# Patient Record
Sex: Female | Born: 1950 | Race: White | Hispanic: No | Marital: Married | Smoking: Never smoker
Health system: Southern US, Community
[De-identification: ages and names within clinical notes are randomized; demographics above are authoritative.]

## PROBLEM LIST (undated history)

## (undated) DIAGNOSIS — E119 Type 2 diabetes mellitus without complications: Secondary | ICD-10-CM

## (undated) DIAGNOSIS — E7800 Pure hypercholesterolemia, unspecified: Secondary | ICD-10-CM

## (undated) DIAGNOSIS — I10 Essential (primary) hypertension: Secondary | ICD-10-CM

## (undated) HISTORY — DX: Type 2 diabetes mellitus without complications: E11.9

## (undated) HISTORY — DX: Pure hypercholesterolemia, unspecified: E78.00

## (undated) HISTORY — DX: Essential (primary) hypertension: I10

---
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
## 1980-04-24 HISTORY — PX: TUBAL LIGATION: SHX77

## 2004-10-04 ENCOUNTER — Ambulatory Visit: Payer: Self-pay | Admitting: Internal Medicine

## 2006-01-04 ENCOUNTER — Ambulatory Visit: Payer: Self-pay | Admitting: Internal Medicine

## 2006-12-31 ENCOUNTER — Ambulatory Visit: Payer: Self-pay | Admitting: Internal Medicine

## 2007-05-02 ENCOUNTER — Ambulatory Visit: Payer: Self-pay | Admitting: Internal Medicine

## 2007-10-22 ENCOUNTER — Ambulatory Visit: Payer: Self-pay | Admitting: Family Medicine

## 2008-05-04 ENCOUNTER — Ambulatory Visit: Payer: Self-pay | Admitting: Internal Medicine

## 2010-02-15 ENCOUNTER — Ambulatory Visit: Payer: Self-pay | Admitting: Internal Medicine

## 2012-02-12 ENCOUNTER — Telehealth: Payer: Self-pay | Admitting: Internal Medicine

## 2012-02-12 NOTE — Telephone Encounter (Signed)
Pt called to get appointment with you made appointment for 1/29.  Pt stated she has not taken her meds since you let [facility] bp med Chol, Diabetic meds  (pill) Pt stated she is feeling fine.

## 2012-02-13 NOTE — Telephone Encounter (Signed)
I can see her on 02/15/12 at 11:30.  Have her bring a list of her meds - even though not taking.  Thanks.

## 2012-02-13 NOTE — Telephone Encounter (Signed)
PT AWARE OF APPOINTMENT 02/19/12 @ 3

## 2012-02-19 ENCOUNTER — Ambulatory Visit (INDEPENDENT_AMBULATORY_CARE_PROVIDER_SITE_OTHER): Payer: Self-pay | Admitting: Internal Medicine

## 2012-02-19 ENCOUNTER — Encounter: Payer: Self-pay | Admitting: Internal Medicine

## 2012-02-19 VITALS — BP 147/88 | HR 98 | Temp 98.1°F | Ht 64.0 in | Wt 170.2 lb

## 2012-02-19 DIAGNOSIS — E1165 Type 2 diabetes mellitus with hyperglycemia: Secondary | ICD-10-CM | POA: Insufficient documentation

## 2012-02-19 DIAGNOSIS — R5383 Other fatigue: Secondary | ICD-10-CM

## 2012-02-19 DIAGNOSIS — E7800 Pure hypercholesterolemia, unspecified: Secondary | ICD-10-CM

## 2012-02-19 DIAGNOSIS — E119 Type 2 diabetes mellitus without complications: Secondary | ICD-10-CM | POA: Insufficient documentation

## 2012-02-19 DIAGNOSIS — I10 Essential (primary) hypertension: Secondary | ICD-10-CM

## 2012-02-19 LAB — POCT CBG (FASTING - GLUCOSE)-MANUAL ENTRY: Glucose Fasting, POC: 399 mg/dL — AB (ref 70–99)

## 2012-02-19 MED ORDER — METFORMIN HCL 500 MG PO TABS: 500.0000 mg | ORAL_TABLET | Freq: Two times a day (BID) | ORAL | Status: DC

## 2012-02-19 MED ORDER — LISINOPRIL 20 MG PO TABS: 20.0000 mg | ORAL_TABLET | Freq: Every day | ORAL | Status: DC

## 2012-02-19 NOTE — Progress Notes (Signed)
  Subjective:    Patient ID: Gabrielle Yu, female    DOB: Aug 26, 1950, 61 y.o.   MRN: [id number]  HPI 61 year old female with past history of hypertension, hypercholesterolemia and diabetes who comes in today for a scheduled follow up.  States she has been off her medication now for a while.  Feels good.  Not exercising and not watching what she eats.  She states am sugars averaging 100-120.  Not checking her sugars regularly.    Past Medical History  Diagnosis Date  . Diabetes mellitus without complication   . Hypertension   . Hyperlipidemia   . Chicken pox     Review of Systems Patient denies any headache, lightheadedness or dizziness.  No chest pain, tightness or palpitations.  No increased shortness of breath, cough or congestion.  No acid reflux, dysphagia or odynophagia. No nausea or vomiting.  No abdominal pain or cramping.  No bowel change, such as diarrhea, constipation, BRBPR or melana.  No urine change.        Objective:   Physical Exam Filed Vitals:   02/19/12 1527  BP: 147/88  Pulse: 98  Temp: 98.1 F (32.22 C)   61 year old female in no acute distress.   HEENT:  Nares - clear.  OP- without lesions or erythema.  NECK:  Supple, nontender.  No audible carotid bruit.   HEART:  Appears to be regular. LUNGS:  Without crackles or wheezing audible.  Respirations even and unlabored.   RADIAL PULSE:  Equal bilaterally.  ABDOMEN:  Soft, nontender.  No audible abdominal bruit.   EXTREMITIES:  No increased edema to be present.                    Assessment & Plan:  FATIGUE.  Check cbc, met c and tsh.    HEALTH MAINTENANCE.  Will need to schedule a physical.  Discuss further health maintenance issus with her at next visit.  She declined a flu shot.

## 2012-02-19 NOTE — Assessment & Plan Note (Signed)
She has not been checking sugars regularly.  Blood sugar today - 399.  States she just drank a large tea.  Wanted to give her some insulin here in the office.  She declined.  Check met b and da1c.  Also check urinalysis and urine microalbumin/cr ratio.  Restart Metformin 1000mg  bid.  Follow sugars and get her back in soon to reassess.

## 2012-02-19 NOTE — Assessment & Plan Note (Addendum)
Blood pressure elevated.  On no medication now.  Check metabolic panel.  Start Lisinopril 20mg  q day.  Check blood pressures and record.  Get her back in soon to reassess.

## 2012-02-19 NOTE — Patient Instructions (Signed)
It was nice seeing you today.  I am glad you have been feeling well.  I am going to restart the Lisinopril and the Metformin.  Follow blood pressures and sugars.  We will get you scheduled for labs and a follow up appt with me soon - to follow up on the blood pressure and the sugars.

## 2012-02-19 NOTE — Assessment & Plan Note (Signed)
Was previously on Pravastatin 40mg  q day.  Has been off this medication for a while.  Low cholesterol diet and exercise.  Check lipid panel.

## 2012-02-21 ENCOUNTER — Other Ambulatory Visit: Payer: Self-pay

## 2012-02-27 ENCOUNTER — Other Ambulatory Visit (INDEPENDENT_AMBULATORY_CARE_PROVIDER_SITE_OTHER): Payer: BC Managed Care – PPO

## 2012-02-27 DIAGNOSIS — R5381 Other malaise: Secondary | ICD-10-CM

## 2012-02-27 DIAGNOSIS — E119 Type 2 diabetes mellitus without complications: Secondary | ICD-10-CM

## 2012-02-27 DIAGNOSIS — R5383 Other fatigue: Secondary | ICD-10-CM

## 2012-02-27 DIAGNOSIS — E7800 Pure hypercholesterolemia, unspecified: Secondary | ICD-10-CM

## 2012-02-27 LAB — URINALYSIS
Bilirubin Urine: NEGATIVE
Hgb urine dipstick: NEGATIVE
Ketones, ur: NEGATIVE
Nitrite: NEGATIVE
Total Protein, Urine: NEGATIVE
Urine Glucose: NEGATIVE
pH: 5.5 (ref 5.0–8.0)

## 2012-02-27 LAB — COMPREHENSIVE METABOLIC PANEL
ALT: 47 U/L — ABNORMAL HIGH (ref 0–35)
AST: 32 U/L (ref 0–37)
Albumin: 4 g/dL (ref 3.5–5.2)
CO2: 28 mEq/L (ref 19–32)
Calcium: 8.7 mg/dL (ref 8.4–10.5)
Chloride: 104 mEq/L (ref 96–112)
Creatinine, Ser: 0.7 mg/dL (ref 0.4–1.2)
GFR: 87.32 mL/min (ref >60.00)
Potassium: 4.1 mEq/L (ref 3.5–5.1)
Sodium: 139 mEq/L (ref 135–145)
Total Protein: 6.9 g/dL (ref 6.0–8.3)

## 2012-02-27 LAB — CBC WITH DIFFERENTIAL/PLATELET
Basophils Absolute: 0.1 10*3/uL (ref 0.0–0.1)
Basophils Relative: 0.8 % (ref 0.0–3.0)
Eosinophils Absolute: 0 10*3/uL (ref 0.0–0.7)
Lymphocytes Relative: 28.6 % (ref 12.0–46.0)
MCHC: 33.1 g/dL (ref 30.0–36.0)
Monocytes Relative: 6.7 % (ref 3.0–12.0)
Neutrophils Relative %: 63.9 % (ref 43.0–77.0)
RBC: 4.66 Mil/uL (ref 3.87–5.11)

## 2012-02-27 LAB — LIPID PANEL
Cholesterol: 249 mg/dL — ABNORMAL HIGH (ref 0–200)
Total CHOL/HDL Ratio: 8
VLDL: 33.2 mg/dL (ref 0.0–40.0)

## 2012-02-27 LAB — LDL CHOLESTEROL, DIRECT: Direct LDL: 198.1 mg/dL

## 2012-02-27 LAB — MICROALBUMIN / CREATININE URINE RATIO: Microalb Creat Ratio: 2.2 mg/g (ref 0.0–30.0)

## 2012-02-28 ENCOUNTER — Other Ambulatory Visit: Payer: Self-pay | Admitting: Internal Medicine

## 2012-02-28 MED ORDER — PRAVASTATIN SODIUM 20 MG PO TABS: 20.0000 mg | ORAL_TABLET | Freq: Every day | ORAL | Status: DC

## 2012-03-06 ENCOUNTER — Ambulatory Visit (INDEPENDENT_AMBULATORY_CARE_PROVIDER_SITE_OTHER): Payer: BC Managed Care – PPO | Admitting: Internal Medicine

## 2012-03-06 ENCOUNTER — Encounter: Payer: Self-pay | Admitting: Internal Medicine

## 2012-03-06 VITALS — BP 180/90 | HR 84 | Temp 98.4°F | Ht 64.0 in | Wt 169.0 lb

## 2012-03-06 DIAGNOSIS — E119 Type 2 diabetes mellitus without complications: Secondary | ICD-10-CM

## 2012-03-06 DIAGNOSIS — I10 Essential (primary) hypertension: Secondary | ICD-10-CM

## 2012-03-06 DIAGNOSIS — E7800 Pure hypercholesterolemia, unspecified: Secondary | ICD-10-CM

## 2012-03-06 LAB — BASIC METABOLIC PANEL
CO2: 27 mEq/L (ref 19–32)
Chloride: 106 mEq/L (ref 96–112)
Creatinine, Ser: 0.7 mg/dL (ref 0.4–1.2)
Sodium: 141 mEq/L (ref 135–145)

## 2012-03-06 MED ORDER — LISINOPRIL 40 MG PO TABS: 40.0000 mg | ORAL_TABLET | Freq: Every day | ORAL | Status: DC

## 2012-03-06 MED ORDER — PRAVASTATIN SODIUM 20 MG PO TABS: 20.0000 mg | ORAL_TABLET | Freq: Every day | ORAL | Status: DC

## 2012-03-06 NOTE — Assessment & Plan Note (Signed)
Blood pressure elevated.  On Lisinopril.  Will increase to 40mg  q day.  Check met b.  Follow pressures.  Get her back in soon to reassess.

## 2012-03-06 NOTE — Progress Notes (Signed)
  Subjective:    Patient ID: Gabrielle Yu, female    DOB: 12-01-1950, 61 y.o.   MRN: [id number]  HPI 61 year old female with past history of hypertension, hypercholesterolemia and diabetes.  She comes in today for a scheduled follow up.  Here today to follow up regarding her blood pressure and her sugars.  Restarted metformin last visit.  A1c returned 12.  Metformin increased to 1000mg  bid.  She has adjusted her diet.  Not drinking soft drinks now.  Brought in a few readings - am sugars 134, 118, 136.  Not checking her blood pressures.  Some minimal bowel change with the increased metformin, but overall is tolerating.  Feels better.    Past Medical History  Diagnosis Date  . Diabetes mellitus   . Hypertension   . Hypercholesterolemia   . Chicken pox     Review of Systems Patient denies any headache, lightheadedness or dizziness.  No chest pain, tightness or palpitations.  No increased shortness of breath, cough or congestion.  No nausea or vomiting.  No abdominal pain or cramping.  Some bowel change - minimal loose stool.  No constipation, BRBPR or melana.  No urine change.        Objective:   Physical Exam Filed Vitals:   03/06/12 0853  BP: 180/90  Pulse: 84  Temp: 98.4 F (36.9 C)      Assessment & Plan:  HEALTH MAINTENANCE.  Pt overdue for a physical.  Offered to do her physical today.  She declined.  Wants to wait until the January appt.  Will need to schedule her mammogram.

## 2012-03-06 NOTE — Assessment & Plan Note (Signed)
Sugars as outlined.  Continue metformin 1000 bid.  Follow sugars.  Get her back in soon to reassess.

## 2012-03-06 NOTE — Patient Instructions (Addendum)
I am going to change your blood pressure medicine (lisinopril) to 40mg  - per day.  Follow your blood pressures and let me know if problems.  Continue to monitor your blood sugars.

## 2012-03-06 NOTE — Assessment & Plan Note (Signed)
Cholesterol elevated.  Restarted the pravastatin.  Low cholesterol diet and exercise.  Follow lipid profile and liver function.

## 2012-04-01 ENCOUNTER — Telehealth: Payer: Self-pay | Admitting: Internal Medicine

## 2012-04-01 ENCOUNTER — Ambulatory Visit (INDEPENDENT_AMBULATORY_CARE_PROVIDER_SITE_OTHER): Payer: BC Managed Care – PPO | Admitting: Internal Medicine

## 2012-04-01 VITALS — BP 151/85 | HR 98 | Temp 98.4°F | Ht 64.0 in | Wt 163.5 lb

## 2012-04-01 DIAGNOSIS — E119 Type 2 diabetes mellitus without complications: Secondary | ICD-10-CM

## 2012-04-01 DIAGNOSIS — I10 Essential (primary) hypertension: Secondary | ICD-10-CM

## 2012-04-01 MED ORDER — CEFDINIR 300 MG PO CAPS: 300.0000 mg | ORAL_CAPSULE | Freq: Two times a day (BID) | ORAL | Status: DC

## 2012-04-01 MED ORDER — FLUTICASONE PROPIONATE 50 MCG/ACT NA SUSP: 2.0000 | Freq: Every day | NASAL | Status: DC

## 2012-04-01 MED ORDER — SULFACETAMIDE SODIUM 10 % OP SOLN: 1.0000 [drp] | Freq: Four times a day (QID) | OPHTHALMIC | Status: DC

## 2012-04-01 NOTE — Telephone Encounter (Signed)
Pt was evaluated in the office today.  ?

## 2012-04-01 NOTE — Patient Instructions (Addendum)
It was nice seeing you today.  I am sorry you have not been feeling well.  I am going to prescribe omnicef (an antibiotic) to take one tablet twice a day.  Also flonase nasal spray - two sprays each nostril in the evening and flush your nose with saline 2-3 x per day.  Robitussin as directed.  Let me know if problems.

## 2012-04-01 NOTE — Telephone Encounter (Signed)
Patient Information:  Caller Name: Gabrielle Yu  Phone: [phone]  Patient: Gabrielle Yu ,  Gender: Female  DOB: 1950/07/12  Age: 61 Years  PCP: Gabrielle Yu   Symptoms  Reason For Call & Symptoms: Called for antibiotics for "sinus infection" for R sided facial tenderness under eyes, cheekbones, cold symptoms for 1-2 weeks and occasional cough.  Reviewed Health History In EMR: Yes  Reviewed Medications In EMR: Yes  Reviewed Allergies In EMR: Yes  Reviewed Surgeries / Procedures: Yes  Date of Onset of Symptoms: 03/29/2012  Treatments Tried: otc cough remedy  Treatments Tried Worked: No  Guideline(s) Used:  Sinus Pain and Congestion  Disposition Per Guideline:   See Today or Tomorrow in Office  Reason For Disposition Reached:   Patient wants to be seen  Advice Given:  Hydration:  Drink plenty of liquids (6-8 glasses of water daily). If the air in your home is dry, use a cool mist humidifier  Reassurance:   Sinus congestion is a normal part of a cold.  Usually home treatment with nasal washes can prevent an actual bacterial sinus infection.  Antibiotics are not helpful for the sinus congestion that occurs with colds.  Here is some care advice that should help.  For a Runny Nose With Profuse Discharge:  Nasal mucus and discharge helps to wash viruses and bacteria out of the nose and sinuses.  If the skin around your nostrils gets irritated, apply a tiny amount of petroleum ointment to the nasal openings once or twice a day.  For a Stuffy Nose - Use Nasal Washes:  Introduction: Saline (salt water) nasal irrigation (nasal wash) is an effective and simple home remedy for treating stuffy nose and sinus congestion. The nose can be irrigated by pouring, spraying, or squirting salt water into the nose and then letting it run back out.  How it Helps: The salt water rinses out excess mucus, washes out any irritants (dust, allergens) that might be present, and moistens the nasal  cavity.  Hydration:  Drink plenty of liquids (6-8 glasses of water daily). If the air in your home is dry, use a cool mist humidifier  Expected Course:  Sinus congestion from viral upper respiratory infections (colds) usually lasts 5-10 days.  Occasionally a cold can worsen and turn into bacterial sinusitis. Clues to this are sinus symptoms lasting longer than 10 days, fever lasting longer than 3 days, and worsening pain. Bacterial sinusitis may need antibiotic treatment.  Call Back If:   Severe pain lasts longer than 2 hours after pain medicine  Sinus pain lasts longer than 1 day after starting treatment using nasal washes  Fever lasts longer than 3 days  You become worse.  Office Follow Up:  Does the office need to follow up with this patient?: No  Instructions For The Office: N/A  Appointment Scheduled:  04/01/2012 10:00:00 Appointment Scheduled Provider:  [place]  RN Note:  Using otc cough medication for diabetic with decongestant. Appointment 04/04/12. Mild facial pain present. FBS 115. Informed must be seen for antibiotics per MD order.

## 2012-04-04 ENCOUNTER — Ambulatory Visit: Payer: BC Managed Care – PPO | Admitting: Internal Medicine

## 2012-04-06 ENCOUNTER — Encounter: Payer: Self-pay | Admitting: Internal Medicine

## 2012-04-06 NOTE — Progress Notes (Signed)
  Subjective:    Patient ID: Gabrielle Yu, female    DOB: 15-Mar-1951, 61 y.o.   MRN: [id number]  HPI 61 year old female with past history of hypertension, hypercholesterolemia and diabetes.  She comes in today as a work in with concerns regarding a possible sinus infection.  States she is having increased pressure in the left maxillary sinus region and it hurts into her teeth.  Eyes matted and crusted.  Increased cough and nasal congestion.  No sore throat.  No earache.  No nausea or vomiting.  No fever.    Past Medical History  Diagnosis Date  . Diabetes mellitus   . Hypertension   . Hypercholesterolemia   . Chicken pox     Review of Systems Patient denies any lightheadedness or dizziness.  Increased maxillary pressure.  Increased nasal congestion.  No chest pain, tightness or palpitations.  No increased shortness of breath.  Does report the increased cough.   No nausea or vomiting.  No abdominal pain or cramping.  No bowel change, such as diarrhea, constipation, BRBPR or melana.  No urine change.        Objective:   Physical Exam Filed Vitals:   04/01/12 1003  BP: 151/85  Pulse: 98  Temp: 98.4 F (36.9 C)   Blood pressure recheck:  20/23  61 year old female in no acute distress.   HEENT:  Nares - erythematous turbinates.  OP- without lesions or erythema.  Minimal tenderness to palpation over the maxillary sinus.  TMs visualized - without erythema.  NECK:  Supple, nontender.   HEART:  Appears to be regular. LUNGS:  Without crackles or wheezing audible.  Respirations even and unlabored.      Assessment & Plan:  PROBABLE SINUSITIS/URI.  Treat with Omnicef 300mg  2/day as directed.  Flonase nasal spray and saline nasal flushes as directed.  Rest.  Fluids.  Notify me or be reevaluated if symptoms change, worsen or do not resolve.

## 2012-04-07 NOTE — Assessment & Plan Note (Signed)
Sugars are doing better - per her report.  Follow sugars.  Same medication.

## 2012-04-07 NOTE — Assessment & Plan Note (Signed)
Blood pressure elevated today.  Have her continue to monitor.  Follow.

## 2012-04-24 HISTORY — PX: BREAST BIOPSY: SHX20

## 2012-04-26 ENCOUNTER — Telehealth: Payer: Self-pay | Admitting: Internal Medicine

## 2012-04-26 NOTE — Telephone Encounter (Signed)
Dr. Lorin Picket,  I am routing to you

## 2012-04-26 NOTE — Telephone Encounter (Signed)
Taking metformin . Keeping her stomach upset. Is there another drug she can take that will do the same thing and cost around the same price.

## 2012-04-27 NOTE — Telephone Encounter (Signed)
Spoke to pt.  She noticed the increased diarrhea when we went up on the dose of the metformin.  Sugars are better now and she is doing better watching her diet.  Will try metformin 500mg  bid and diet adjustment and she will record sugars.  If persistent problems with diarrhea, she will call and we will change meds.  If persistent elevation in sugars, will add a different medication.

## 2012-04-30 ENCOUNTER — Telehealth: Payer: Self-pay | Admitting: Internal Medicine

## 2012-04-30 MED ORDER — METFORMIN HCL 500 MG PO TABS: 500.0000 mg | ORAL_TABLET | Freq: Two times a day (BID) | ORAL | Status: DC

## 2012-04-30 NOTE — Telephone Encounter (Signed)
Patient advised as instructed via telephone, new Rx called in for Metformin 500mg  twice daily to [facility]

## 2012-04-30 NOTE — Telephone Encounter (Signed)
Take the 500mg  bid.  After discussion with her with the increased diarrhea - needs to take 500mg  bid.

## 2012-04-30 NOTE — Telephone Encounter (Signed)
Metformin was sent into the pharmacy for 1000 mg tablet . Should she take 1000 mg or should she go back to 500 mg twice a day.

## 2012-05-01 NOTE — Telephone Encounter (Signed)
Pt pharmacy contacted. Rx was verified by myself for the 500mg  BID # 180. To accomodate a 3 month supply.

## 2012-05-01 NOTE — Telephone Encounter (Deleted)
This information was verified by Shanda Bumps with the pharmacy at [facility] [address]

## 2012-05-01 NOTE — Telephone Encounter (Signed)
Patient called stating "my prescription isn't right." She said the pharmacy wouldn't fill her medication because the quantity was incorrect

## 2012-05-22 ENCOUNTER — Encounter: Payer: Self-pay | Admitting: Internal Medicine

## 2012-05-30 ENCOUNTER — Ambulatory Visit (INDEPENDENT_AMBULATORY_CARE_PROVIDER_SITE_OTHER): Payer: BC Managed Care – PPO | Admitting: Internal Medicine

## 2012-05-30 ENCOUNTER — Encounter: Payer: Self-pay | Admitting: Internal Medicine

## 2012-05-30 VITALS — BP 130/88 | HR 83 | Temp 98.8°F | Ht 64.0 in | Wt 161.5 lb

## 2012-05-30 DIAGNOSIS — R945 Abnormal results of liver function studies: Secondary | ICD-10-CM

## 2012-05-30 DIAGNOSIS — Z139 Encounter for screening, unspecified: Secondary | ICD-10-CM

## 2012-05-30 DIAGNOSIS — K769 Liver disease, unspecified: Secondary | ICD-10-CM

## 2012-05-30 DIAGNOSIS — I10 Essential (primary) hypertension: Secondary | ICD-10-CM

## 2012-05-30 DIAGNOSIS — E119 Type 2 diabetes mellitus without complications: Secondary | ICD-10-CM

## 2012-05-30 DIAGNOSIS — E7800 Pure hypercholesterolemia, unspecified: Secondary | ICD-10-CM

## 2012-05-30 LAB — BASIC METABOLIC PANEL
Calcium: 9.1 mg/dL (ref 8.4–10.5)
Creatinine, Ser: 0.8 mg/dL (ref 0.4–1.2)
GFR: 76.16 mL/min (ref >60.00)
Sodium: 140 mEq/L (ref 135–145)

## 2012-05-30 LAB — LIPID PANEL
Cholesterol: 193 mg/dL (ref 0–200)
Triglycerides: 148 mg/dL (ref 0.0–149.0)

## 2012-05-30 LAB — HEPATIC FUNCTION PANEL: Albumin: 4.2 g/dL (ref 3.5–5.2)

## 2012-05-30 MED ORDER — HYDROCHLOROTHIAZIDE 12.5 MG PO CAPS: 12.5000 mg | ORAL_CAPSULE | Freq: Every day | ORAL | Status: DC

## 2012-05-31 ENCOUNTER — Other Ambulatory Visit: Payer: Self-pay | Admitting: Internal Medicine

## 2012-05-31 ENCOUNTER — Encounter: Payer: Self-pay | Admitting: Internal Medicine

## 2012-05-31 DIAGNOSIS — R945 Abnormal results of liver function studies: Secondary | ICD-10-CM | POA: Insufficient documentation

## 2012-05-31 NOTE — Assessment & Plan Note (Signed)
Sugars per her report, much better.  Discussed need for eye exam.  Continue diet adjustment and exercise.  Check metabolic panel and a1c.  Same medication regimen.

## 2012-05-31 NOTE — Progress Notes (Signed)
  Subjective:    Patient ID: Gabrielle Yu, female    DOB: 1950/09/03, 62 y.o.   MRN: [id number]  HPI 62 year old female with past history of hypertension, hypercholesterolemia and diabetes.  She comes in today to follow up on these issues as well as for a complete physical exam.  Is now taking her lisinopril and pravastatin.  She is also taking metformin 500mg  bid.  Brought in no recorded sugar readings.  States she has been checking and she has no sugar readings recently over 130 - (am or pm). Previous congestion has resolved.  No sob.  Trying to eat better.  No nausea or vomiting.  No bowel change.      Past Medical History  Diagnosis Date  . Diabetes mellitus   . Hypertension   . Hypercholesterolemia     Current Outpatient Prescriptions on File Prior to Visit  Medication Sig Dispense Refill  . hydrochlorothiazide (MICROZIDE) 12.5 MG capsule Take 1 capsule (12.5 mg total) by mouth daily.  30 capsule  3  . lisinopril (PRINIVIL,ZESTRIL) 40 MG tablet Take 1 tablet (40 mg total) by mouth daily.  90 tablet  1  . metFORMIN (GLUCOPHAGE) 500 MG tablet Take 1 tablet (500 mg total) by mouth 2 (two) times daily.  90 tablet  3  . pravastatin (PRAVACHOL) 20 MG tablet Take 1 tablet (20 mg total) by mouth daily.  90 tablet  1    Review of Systems Patient denies any lightheadedness or dizziness.  No sinus or allergy problems.  No chest pain, tightness or palpitations.  No increased shortness of breath.  No cough or chest congestion.  No nausea or vomiting.  No abdominal pain or cramping.  No bowel change, such as diarrhea, constipation, BRBPR or melana.  No urine change.  She does report a persistent left leg lesion.  Some scaling.  Has been applying neosporin daily for weeks.       Objective:   Physical Exam  Filed Vitals:   05/30/12 0820  BP: 130/88  Pulse: 83  Temp: 98.8 F (37.1 C)   Blood pressure recheck:  56-12/36  63 year old female in no acute distress.   HEENT:  Nares- clear.   Oropharynx - without lesions. NECK:  Supple.  Nontender.  No audible bruit.  HEART:  Appears to be regular. LUNGS:  No crackles or wheezing audible.  Respirations even and unlabored.  RADIAL PULSE:  Equal bilaterally.    ABDOMEN:  Soft, nontender.  Bowel sounds present and normal.  No audible abdominal bruit.    EXTREMITIES:  No increased edema present.  DP pulses palpable and equal bilaterally.   SKIN:  Lesion left lower let - scaling.  Nontender.   No evidence of infection.          Assessment & Plan:  SKIN LESION.  Persistent.  May be being aggravated by the neosporin.  No evidence of infection.  Stop neosporin.  Follow.  Notify me if persistent.    CARDIOVASCULAR.  Has a heart murmur.  ECHO 2001 revealed trivial MR and TR with EF 55-60%.  Currently asymptomatic.    HEALTH MAINTENANCE.  Physical today.  Schedule mammogram.  Discussed need for colonoscopy.  She declines.  Did agree to IFOB.

## 2012-05-31 NOTE — Assessment & Plan Note (Signed)
Blood pressure still elevated.  Add HCTZ 12.5mg  (back to her regimen).  She will monitor pressures and record.  Will send in over the next few weeks.

## 2012-05-31 NOTE — Progress Notes (Signed)
Order placed for follow up lab.  

## 2012-05-31 NOTE — Assessment & Plan Note (Signed)
On pravastatin.  Low cholesterol diet and exercise.  Check lipid panel and liver function.    

## 2012-05-31 NOTE — Assessment & Plan Note (Signed)
Has had a history of elevated liver enzymes.  Has declined abdominal ultrasound.  Recheck liver panel.  Diet and weight loss.

## 2012-07-04 ENCOUNTER — Other Ambulatory Visit: Payer: BC Managed Care – PPO

## 2012-07-11 ENCOUNTER — Other Ambulatory Visit (INDEPENDENT_AMBULATORY_CARE_PROVIDER_SITE_OTHER): Payer: BC Managed Care – PPO

## 2012-07-11 DIAGNOSIS — R945 Abnormal results of liver function studies: Secondary | ICD-10-CM

## 2012-07-11 DIAGNOSIS — K769 Liver disease, unspecified: Secondary | ICD-10-CM

## 2012-07-11 LAB — HEPATIC FUNCTION PANEL
AST: 26 U/L (ref 0–37)
Alkaline Phosphatase: 64 U/L (ref 39–117)
Total Bilirubin: 0.7 mg/dL (ref 0.3–1.2)

## 2012-07-12 ENCOUNTER — Other Ambulatory Visit: Payer: Self-pay | Admitting: Internal Medicine

## 2012-07-12 DIAGNOSIS — R945 Abnormal results of liver function studies: Secondary | ICD-10-CM

## 2012-07-12 DIAGNOSIS — E119 Type 2 diabetes mellitus without complications: Secondary | ICD-10-CM

## 2012-07-12 DIAGNOSIS — I10 Essential (primary) hypertension: Secondary | ICD-10-CM

## 2012-07-12 DIAGNOSIS — E7800 Pure hypercholesterolemia, unspecified: Secondary | ICD-10-CM

## 2012-07-12 NOTE — Progress Notes (Signed)
Follow up labs ordered.

## 2012-07-19 ENCOUNTER — Ambulatory Visit: Payer: Self-pay | Admitting: Internal Medicine

## 2012-08-01 ENCOUNTER — Ambulatory Visit: Payer: Self-pay | Admitting: Internal Medicine

## 2012-08-04 ENCOUNTER — Telehealth: Payer: Self-pay | Admitting: Internal Medicine

## 2012-08-04 DIAGNOSIS — R928 Other abnormal and inconclusive findings on diagnostic imaging of breast: Secondary | ICD-10-CM

## 2012-08-04 MED ORDER — ALPRAZOLAM 0.25 MG PO TABS: ORAL_TABLET | ORAL | Status: DC

## 2012-08-04 NOTE — Telephone Encounter (Signed)
Pt notified of abnormal mammo results and need for referral to surgery.  Order for referral to Dr Lemar Livings placed.  She is having increased anxiety.  Wants something to help her calm down. Called in xanax .25mg  bid prn #20 with no refills.

## 2012-08-06 ENCOUNTER — Ambulatory Visit (INDEPENDENT_AMBULATORY_CARE_PROVIDER_SITE_OTHER): Payer: BC Managed Care – PPO | Admitting: General Surgery

## 2012-08-06 ENCOUNTER — Encounter: Payer: Self-pay | Admitting: General Surgery

## 2012-08-06 VITALS — BP 152/84 | HR 92 | Resp 16 | Ht 64.0 in | Wt 163.0 lb

## 2012-08-06 DIAGNOSIS — R928 Other abnormal and inconclusive findings on diagnostic imaging of breast: Secondary | ICD-10-CM | POA: Insufficient documentation

## 2012-08-06 DIAGNOSIS — N63 Unspecified lump in unspecified breast: Secondary | ICD-10-CM

## 2012-08-06 NOTE — Patient Instructions (Addendum)

## 2012-08-06 NOTE — Procedures (Signed)
Ultrasound examination of the left breast in the 4:00 position 2 cm from the nipple the area of the palpable thickening showed a 0.7 x 0.85 x 1.34 cm hypoechoic area with focal acoustic shadowing. The patient was amenable to vacuum-assisted biopsy. Using 10 cc of 0.5% Xylocaine with 0.25% Marcaine with one 200,000 of epinephrine, the area was prepped with ChloraPrep and draped. A 10-gauge Encor device was passed under ultrasound guidance. The area was removed in its entirety. A postbiopsy clip was placed. The skin defect was closed with benzoin and Steri-Strips followed by Telfa Tegaderm dressing. The patient tolerated the procedure well.

## 2012-08-06 NOTE — Progress Notes (Signed)
Patient ID: Gabrielle Yu, female   DOB: May 29, 1950, 62 y.o.   MRN: [id number]  Chief Complaint  Patient presents with  . Other    mammo    HPI Gabrielle Yu is a 62 y.o. female here today for an abnormal mammogram. Most recent mammogram was done 07/19/12 at [facility] with birad category 0. Added views were done of the right breast on 08/01/12 with birad category 4. Left breast ultrasound was done on 08/01/12 as well. She denies any current or previous breast problems or surgeries. Her mother had a history of breast cancer in which she was diagnosed at age 69. She checks her breast regularly and was getting regular mammograms up until 2-3 years ago. She states she hasn't had a mammogram recently until now because she was unable find out where her primary care physician relocated.   HPI  Past Medical History  Diagnosis Date  . Diabetes mellitus   . Hypertension   . Hypercholesterolemia   . High cholesterol     Past Surgical History  Procedure Laterality Date  . Tonsillectomy and adenoidectomy  1958  . Tubal ligation  1982    Family History  Problem Relation Age of Onset  . Breast cancer Mother   . Heart disease Father     myocardial infarction - died age 68  . Heart disease      paternal aunts and uncles  . Colon cancer Neg Hx   . Hypertension Mother     Social History History  Substance Use Topics  . Smoking status: Never Smoker   . Smokeless tobacco: Never Used  . Alcohol Use: No    Allergies  Allergen Reactions  . Zoloft (Sertraline Hcl) Rash    Current Outpatient Prescriptions  Medication Sig Dispense Refill  . hydrochlorothiazide (MICROZIDE) 12.5 MG capsule Take 1 capsule (12.5 mg total) by mouth daily.  30 capsule  3  . lisinopril (PRINIVIL,ZESTRIL) 40 MG tablet Take 1 tablet (40 mg total) by mouth daily.  90 tablet  1  . metFORMIN (GLUCOPHAGE) 500 MG tablet Take 1 tablet (500 mg total) by mouth 2 (two) times daily.  90 tablet  3  . pravastatin (PRAVACHOL) 20 MG  tablet Take 1 tablet (20 mg total) by mouth daily.  90 tablet  1   No current facility-administered medications for this visit.    Review of Systems Review of Systems  Constitutional: Negative.   Respiratory: Negative.   Cardiovascular: Negative.   Psychiatric/Behavioral: Positive for agitation.    Blood pressure 152/84, pulse 92, resp. rate 16, height 5\' 4"  (1.626 m), weight 163 lb (73.936 kg).  Physical Exam Physical Exam  Constitutional: She appears well-developed and well-nourished.  Neck: Trachea normal. No mass and no thyromegaly present.  Cardiovascular: Normal rate, regular rhythm, normal heart sounds and normal pulses.   No murmur heard. Pulmonary/Chest: Effort normal and breath sounds normal. Right breast exhibits no inverted nipple, no mass, no nipple discharge, no skin change and no tenderness. Left breast exhibits no inverted nipple, no mass, no nipple discharge, no skin change and no tenderness. Breasts are symmetrical.  Left breast thickening at 4 o'clock.  Lymphadenopathy:    She has no cervical adenopathy.    She has no axillary adenopathy.    Data Reviewed Screening mammograms dated July 19, 2012 were compared to October 2011 studies. A focal asymmetry was noted in the inferior lateral aspect of left breast. Additional views were requested. BI-RAD-0.  Focal spot compression views of the left breast  in August 01, 2012 showed persistent focal asymmetry at the 4:00 position. Ultrasound examination of this area shows 0.7 x 0.7 x 1.3 cm hypoechoic mass with posterior acoustic shadowing. BIRAD-4.  Ultrasound examination of the left breast in the 4:00 position 2 cm from the nipple the area of the palpable thickening showed a 0.7 x 0.85 x 1.34 cm hypoechoic area with focal acoustic shadowing. The patient was amenable to vacuum-assisted biopsy. Using 10 cc of 0.5% Xylocaine with 0.25% Marcaine with one 200,000 of epinephrine, the area was prepped with ChloraPrep and draped.  A 10-gauge Encor device was passed under ultrasound guidance. The area was removed in its entirety. A postbiopsy clip was placed. The skin defect was closed with benzoin and Steri-Strips followed by Telfa Tegaderm dressing. The patient tolerated the procedure well.  Assessment    Abnormal left breast mammogram and focal thickening in the lower outer quadrant.     Plan    The patient will be contacted when the pathology reports are available. Written instructions have been provided for wound care.        Gabrielle Yu 08/06/2012, 10:01 PM

## 2012-08-07 ENCOUNTER — Telehealth: Payer: Self-pay | Admitting: *Deleted

## 2012-08-07 LAB — PATHOLOGY

## 2012-08-07 NOTE — Telephone Encounter (Signed)
Notified patient as instructed, patient very pleased. Discussed follow-up appointments, patient agrees 

## 2012-08-07 NOTE — Telephone Encounter (Signed)
Message copied by Currie Paris on Wed Aug 07, 2012  3:22 PM ------      Message from: [place], [place] W      Created: Wed Aug 07, 2012  2:47 PM       Please notify the patient the biopsy was benign. F/U w/ RN as scheduled. Will arrange for f/u mammogram in six months.  ------

## 2012-08-13 ENCOUNTER — Ambulatory Visit (INDEPENDENT_AMBULATORY_CARE_PROVIDER_SITE_OTHER): Payer: BC Managed Care – PPO | Admitting: *Deleted

## 2012-08-13 DIAGNOSIS — R928 Other abnormal and inconclusive findings on diagnostic imaging of breast: Secondary | ICD-10-CM

## 2012-08-13 NOTE — Patient Instructions (Addendum)
Patient here today for follow up post left breast biopsy.  Dressing removed, steristrip in place and aware it may come off in one week.  Minimal bruising noted.  The patient is aware that a heating pad may be used for comfort as needed.  Aware of pathology. Follow up as scheduled. 

## 2012-08-14 NOTE — Progress Notes (Signed)
Quick Note:  The pathology has been reviewed. The lesion identified on ultrasound was shadowing,but as it was completely removed during her core biopsy I think that the pathology is indeed correct. ______

## 2012-08-22 ENCOUNTER — Telehealth: Payer: Self-pay

## 2012-08-22 NOTE — Telephone Encounter (Signed)
Patient left voicemail about her mammogram and that she is needing a biopsy. Left message on patient voicemail for her to give the offfice a call back about any questions she was needing answered.

## 2012-08-29 ENCOUNTER — Encounter: Payer: Self-pay | Admitting: Internal Medicine

## 2012-08-30 ENCOUNTER — Encounter: Payer: Self-pay | Admitting: Internal Medicine

## 2012-09-27 ENCOUNTER — Ambulatory Visit: Payer: BC Managed Care – PPO | Admitting: Internal Medicine

## 2012-10-18 ENCOUNTER — Other Ambulatory Visit: Payer: Self-pay | Admitting: Internal Medicine

## 2012-10-18 NOTE — Telephone Encounter (Signed)
Eprescribed.

## 2012-11-06 ENCOUNTER — Other Ambulatory Visit: Payer: Self-pay | Admitting: Internal Medicine

## 2012-11-29 ENCOUNTER — Telehealth: Payer: Self-pay | Admitting: *Deleted

## 2012-11-29 ENCOUNTER — Other Ambulatory Visit: Payer: Self-pay | Admitting: *Deleted

## 2012-11-29 NOTE — Telephone Encounter (Signed)
Spoke with pt, states she has never had an eye exam, flu vaccine, or pneumonia vaccine. Advised on need for eye exam and vaccines. Also notified pt that she is due for a followup with Dr. Lorin Picket. Pt declined to schedule an appointment while on the phone. States she will call office once she can get through "the next two weeks of school."

## 2013-01-06 ENCOUNTER — Other Ambulatory Visit: Payer: Self-pay | Admitting: Internal Medicine

## 2013-01-21 ENCOUNTER — Other Ambulatory Visit (INDEPENDENT_AMBULATORY_CARE_PROVIDER_SITE_OTHER): Payer: BC Managed Care – PPO

## 2013-01-21 DIAGNOSIS — E7800 Pure hypercholesterolemia, unspecified: Secondary | ICD-10-CM

## 2013-01-21 DIAGNOSIS — E119 Type 2 diabetes mellitus without complications: Secondary | ICD-10-CM

## 2013-01-21 DIAGNOSIS — R945 Abnormal results of liver function studies: Secondary | ICD-10-CM

## 2013-01-21 DIAGNOSIS — K769 Liver disease, unspecified: Secondary | ICD-10-CM

## 2013-01-21 DIAGNOSIS — I10 Essential (primary) hypertension: Secondary | ICD-10-CM

## 2013-01-21 LAB — BASIC METABOLIC PANEL
BUN: 17 mg/dL (ref 6–23)
Chloride: 102 mEq/L (ref 96–112)
Creatinine, Ser: 0.9 mg/dL (ref 0.4–1.2)
Glucose, Bld: 130 mg/dL — ABNORMAL HIGH (ref 70–99)

## 2013-01-21 LAB — HEMOGLOBIN A1C: Hgb A1c MFr Bld: 8.2 % — ABNORMAL HIGH (ref 4.6–6.5)

## 2013-01-21 LAB — LIPID PANEL
Total CHOL/HDL Ratio: 6
VLDL: 30.4 mg/dL (ref 0.0–40.0)

## 2013-01-21 LAB — HEPATIC FUNCTION PANEL
Alkaline Phosphatase: 50 U/L (ref 39–117)
Bilirubin, Direct: 0.1 mg/dL (ref 0.0–0.3)
Total Bilirubin: 0.6 mg/dL (ref 0.3–1.2)

## 2013-01-27 ENCOUNTER — Encounter: Payer: Self-pay | Admitting: Internal Medicine

## 2013-01-27 ENCOUNTER — Ambulatory Visit (INDEPENDENT_AMBULATORY_CARE_PROVIDER_SITE_OTHER): Payer: BC Managed Care – PPO | Admitting: Internal Medicine

## 2013-01-27 VITALS — BP 112/80 | HR 87 | Temp 98.5°F | Ht 64.0 in | Wt 167.8 lb

## 2013-01-27 DIAGNOSIS — K769 Liver disease, unspecified: Secondary | ICD-10-CM

## 2013-01-27 DIAGNOSIS — E7800 Pure hypercholesterolemia, unspecified: Secondary | ICD-10-CM

## 2013-01-27 DIAGNOSIS — Z23 Encounter for immunization: Secondary | ICD-10-CM

## 2013-01-27 DIAGNOSIS — R928 Other abnormal and inconclusive findings on diagnostic imaging of breast: Secondary | ICD-10-CM

## 2013-01-27 DIAGNOSIS — E119 Type 2 diabetes mellitus without complications: Secondary | ICD-10-CM

## 2013-01-27 DIAGNOSIS — I10 Essential (primary) hypertension: Secondary | ICD-10-CM

## 2013-01-27 DIAGNOSIS — R945 Abnormal results of liver function studies: Secondary | ICD-10-CM

## 2013-01-27 MED ORDER — PRAVASTATIN SODIUM 40 MG PO TABS: 40.0000 mg | ORAL_TABLET | Freq: Every day | ORAL | Status: DC

## 2013-01-27 MED ORDER — HYDROCHLOROTHIAZIDE 12.5 MG PO CAPS: 12.5000 mg | ORAL_CAPSULE | Freq: Every day | ORAL | Status: DC

## 2013-01-29 ENCOUNTER — Encounter: Payer: Self-pay | Admitting: Internal Medicine

## 2013-01-29 NOTE — Assessment & Plan Note (Signed)
Recent A1c elevated.  Discussed at length with her today.  She has adjusted her diet and is now checking her sugars.  Sugars significantly improved.  See readings as outlined.  Will continue same medication for now.  Get her back in soon to reassess.  If persistent elevation, will require further medications.  Needs eye exam.

## 2013-01-29 NOTE — Assessment & Plan Note (Signed)
On pravastatin.  Low cholesterol diet and exercise.  Recent LDL remains elevated.  Increase pravastatin to 20mg  q day.  Follow.

## 2013-01-29 NOTE — Assessment & Plan Note (Signed)
Mammogram 07/19/12 - recommended f/u views.  F/u mammogram 08/01/12 - Birads IV.  Referred to Dr Lemar Livings.  Biopsy negative.

## 2013-01-29 NOTE — Progress Notes (Signed)
  Subjective:    Patient ID: Gabrielle Yu, female    DOB: [birth year], 62 y.o.   MRN: [id number]  HPI 62 year old female with past history of hypertension, hypercholesterolemia and diabetes.  She comes in today for a scheduled follow up.  I asked her to come in after review of her recent labs.  Her a1c is elevated - 8.2.  She is taking metformin 500mg  bid.  Brought in no recorded sugar readings.  Has not been checking her sugars.  Not watching what she eats and not exercising.  Since being notified of her labs, she has started checking her sugars.  Also has been paying more attention to her diet.  No sob.  No nausea or vomiting.  No bowel change.  Unable to increase metformin secondary her GI intolerance.  Feels better.      Past Medical History  Diagnosis Date  . Diabetes mellitus   . Hypertension   . Hypercholesterolemia   . High cholesterol     Current Outpatient Prescriptions on File Prior to Visit  Medication Sig Dispense Refill  . lisinopril (PRINIVIL,ZESTRIL) 40 MG tablet Take 1 tablet (40 mg total) by mouth daily. NEEDS APPT FOR FURTHER REFILLS  90 tablet  1  . metFORMIN (GLUCOPHAGE) 500 MG tablet Take 1 tablet (500 mg total) by mouth 2 (two) times daily.  90 tablet  3   No current facility-administered medications on file prior to visit.    Review of Systems Patient denies any lightheadedness or dizziness.  No sinus or allergy problems.  No chest pain, tightness or palpitations.  No increased shortness of breath.  No cough or chest congestion.  No nausea or vomiting.  No abdominal pain or cramping.  No bowel change, such as diarrhea, constipation, BRBPR or melana.  No urine change.   States sugars the last few mornings have decreased from 140 to 130 to 122 this am.        Objective:   Physical Exam  Filed Vitals:   01/27/13 0802  BP: 112/80  Pulse: 87  Temp: 98.5 F (56.36 C)   62 year old female in no acute distress.   HEENT:  Nares- clear.  Oropharynx - without  lesions. NECK:  Supple.  Nontender.  No audible bruit.  HEART:  Appears to be regular. LUNGS:  No crackles or wheezing audible.  Respirations even and unlabored.  RADIAL PULSE:  Equal bilaterally.    ABDOMEN:  Soft, nontender.  Bowel sounds present and normal.  No audible abdominal bruit.    EXTREMITIES:  No increased edema present.  DP pulses palpable and equal bilaterally.   FEET:  No lesions.          Assessment & Plan:  CARDIOVASCULAR.  Has a heart murmur.  ECHO 2001 revealed trivial MR and TR with EF 55-60%.  Currently asymptomatic.    HEALTH MAINTENANCE.  Physical 05/30/12..  Mammogram 3/14 - birads 4.  Referred to Dr Lemar Livings.  Biopsy negative.   Have discussed need for colonoscopy.  She has declined.

## 2013-01-29 NOTE — Assessment & Plan Note (Signed)
Blood pressure doing well.  Follow.  Follow metabolic panel.  

## 2013-01-29 NOTE — Assessment & Plan Note (Signed)
Has had a history of elevated liver enzymes.  Has declined abdominal ultrasound.  Again discussed with her today.  AST/ALT elevated.  She continues to decline further w/up.  Diet and weight loss.

## 2013-02-04 ENCOUNTER — Ambulatory Visit: Payer: BC Managed Care – PPO | Admitting: General Surgery

## 2013-02-25 ENCOUNTER — Other Ambulatory Visit (INDEPENDENT_AMBULATORY_CARE_PROVIDER_SITE_OTHER): Payer: BC Managed Care – PPO

## 2013-02-25 DIAGNOSIS — R945 Abnormal results of liver function studies: Secondary | ICD-10-CM

## 2013-02-25 DIAGNOSIS — K769 Liver disease, unspecified: Secondary | ICD-10-CM

## 2013-02-25 LAB — HEPATIC FUNCTION PANEL
ALT: 58 U/L — ABNORMAL HIGH (ref 0–35)
Alkaline Phosphatase: 48 U/L (ref 39–117)
Bilirubin, Direct: 0.1 mg/dL (ref 0.0–0.3)
Total Bilirubin: 0.4 mg/dL (ref 0.3–1.2)

## 2013-02-27 ENCOUNTER — Ambulatory Visit (INDEPENDENT_AMBULATORY_CARE_PROVIDER_SITE_OTHER): Payer: BC Managed Care – PPO | Admitting: Internal Medicine

## 2013-02-27 ENCOUNTER — Encounter: Payer: Self-pay | Admitting: Internal Medicine

## 2013-02-27 VITALS — BP 120/80 | HR 81 | Temp 97.9°F | Ht 64.0 in | Wt 167.5 lb

## 2013-02-27 DIAGNOSIS — E7800 Pure hypercholesterolemia, unspecified: Secondary | ICD-10-CM

## 2013-02-27 DIAGNOSIS — R945 Abnormal results of liver function studies: Secondary | ICD-10-CM

## 2013-02-27 DIAGNOSIS — E119 Type 2 diabetes mellitus without complications: Secondary | ICD-10-CM

## 2013-02-27 DIAGNOSIS — I10 Essential (primary) hypertension: Secondary | ICD-10-CM

## 2013-02-27 DIAGNOSIS — R928 Other abnormal and inconclusive findings on diagnostic imaging of breast: Secondary | ICD-10-CM

## 2013-02-27 DIAGNOSIS — K769 Liver disease, unspecified: Secondary | ICD-10-CM

## 2013-02-27 NOTE — Assessment & Plan Note (Addendum)
She has adjusted her diet and is now checking her sugars.  Sugars improved.  See readings as outlined.  Will continue same medication.   Needs eye exam.

## 2013-02-27 NOTE — Progress Notes (Signed)
Pre-visit discussion using our clinic review tool. No additional management support is needed unless otherwise documented below in the visit note.  

## 2013-02-27 NOTE — Assessment & Plan Note (Addendum)
Has had a history of elevated liver enzymes.  Has declined abdominal ultrasound.  Again discussed with her today.  AST/ALT elevated.  She continues to decline further w/up.  Diet and weight loss.  AST/ALT stable.

## 2013-02-27 NOTE — Assessment & Plan Note (Signed)
Mammogram 07/19/12 - recommended f/u views.  F/u mammogram 08/01/12 - Birads IV.  Referred to Dr Byrnett.  Biopsy negative.   

## 2013-02-27 NOTE — Progress Notes (Signed)
  Subjective:    Patient ID: Gabrielle Yu, female    DOB: 02-14-1951, 62 y.o.   MRN: [id number]  HPI 62 year old female with past history of hypertension, hypercholesterolemia and diabetes.  She comes in today for a scheduled follow up.   She is taking metformin 500mg  bid.  Brought in a few sugar readings.  See attached list.  overall appear to be doing well.  Trying to watch what she eats.  No chest pain or tightness.  No sob.  No nausea or vomiting.  No bowel change.  Feels good.       Past Medical History  Diagnosis Date  . Diabetes mellitus   . Hypertension   . Hypercholesterolemia   . High cholesterol     Current Outpatient Prescriptions on File Prior to Visit  Medication Sig Dispense Refill  . hydrochlorothiazide (MICROZIDE) 12.5 MG capsule Take 1 capsule (12.5 mg total) by mouth daily.  90 capsule  1  . lisinopril (PRINIVIL,ZESTRIL) 40 MG tablet Take 1 tablet (40 mg total) by mouth daily. NEEDS APPT FOR FURTHER REFILLS  90 tablet  1  . metFORMIN (GLUCOPHAGE) 500 MG tablet Take 1 tablet (500 mg total) by mouth 2 (two) times daily.  90 tablet  3  . pravastatin (PRAVACHOL) 40 MG tablet Take 1 tablet (40 mg total) by mouth daily.  90 tablet  1   No current facility-administered medications on file prior to visit.    Review of Systems Patient denies any lightheadedness or dizziness.  No sinus or allergy problems.  No chest pain, tightness or palpitations.  No increased shortness of breath.  No cough or chest congestion.  No nausea or vomiting.  No abdominal pain or cramping.  No bowel change, such as diarrhea, constipation, BRBPR or melana.  No urine change.   Sugars better.         Objective:   Physical Exam  Filed Vitals:   02/27/13 0938  BP: 120/80  Pulse: 81  Temp: 97.9 F (36.6 C)   Blood pressure recheck:  128/82, pulse 52  62 year old female in no acute distress.   HEENT:  Nares- clear.  Oropharynx - without lesions. NECK:  Supple.  Nontender.  No audible bruit.   HEART:  Appears to be regular. LUNGS:  No crackles or wheezing audible.  Respirations even and unlabored.  RADIAL PULSE:  Equal bilaterally.    ABDOMEN:  Soft, nontender.  Bowel sounds present and normal.  No audible abdominal bruit.    EXTREMITIES:  No increased edema present.  DP pulses palpable and equal bilaterally.   FEET:  No lesions.          Assessment & Plan:  CARDIOVASCULAR.  Has a heart murmur.  ECHO 2001 revealed trivial MR and TR with EF 55-60%.  Currently asymptomatic.    HEALTH MAINTENANCE.  Physical 05/30/12..  Mammogram 3/14 - birads 4.  Referred to Dr Lemar Livings.  Biopsy negative.   Have discussed need for colonoscopy.  She has declined.

## 2013-02-27 NOTE — Assessment & Plan Note (Signed)
Blood pressure doing well.  Follow.  Follow metabolic panel.  

## 2013-02-27 NOTE — Assessment & Plan Note (Addendum)
On pravastatin 40mg .  Will change to 20mg  (two tablets per day).  Tolerates the 20mg  tablets better.  Follow lipid profile and liver function.

## 2013-03-02 ENCOUNTER — Encounter: Payer: Self-pay | Admitting: Internal Medicine

## 2013-03-02 MED ORDER — PRAVASTATIN SODIUM 20 MG PO TABS: ORAL_TABLET | ORAL | Status: DC

## 2013-03-18 ENCOUNTER — Encounter: Payer: Self-pay | Admitting: *Deleted

## 2013-05-03 ENCOUNTER — Other Ambulatory Visit: Payer: Self-pay | Admitting: Internal Medicine

## 2013-06-11 ENCOUNTER — Encounter: Payer: BC Managed Care – PPO | Admitting: Internal Medicine

## 2013-06-23 ENCOUNTER — Other Ambulatory Visit: Payer: Self-pay | Admitting: Internal Medicine

## 2013-08-25 ENCOUNTER — Ambulatory Visit (INDEPENDENT_AMBULATORY_CARE_PROVIDER_SITE_OTHER): Payer: BC Managed Care – PPO | Admitting: Internal Medicine

## 2013-08-25 ENCOUNTER — Other Ambulatory Visit (HOSPITAL_COMMUNITY)
Admission: RE | Admit: 2013-08-25 | Discharge: 2013-08-25 | Disposition: A | Discharge status: Home / Self Care | Payer: BC Managed Care – PPO | Source: Ambulatory Visit | Attending: Internal Medicine | Admitting: Internal Medicine

## 2013-08-25 ENCOUNTER — Encounter: Payer: Self-pay | Admitting: Internal Medicine

## 2013-08-25 VITALS — BP 130/80 | HR 95 | Temp 98.3°F | Ht 64.5 in | Wt 170.8 lb

## 2013-08-25 DIAGNOSIS — I10 Essential (primary) hypertension: Secondary | ICD-10-CM

## 2013-08-25 DIAGNOSIS — Z1151 Encounter for screening for human papillomavirus (HPV): Secondary | ICD-10-CM | POA: Insufficient documentation

## 2013-08-25 DIAGNOSIS — E119 Type 2 diabetes mellitus without complications: Secondary | ICD-10-CM

## 2013-08-25 DIAGNOSIS — R945 Abnormal results of liver function studies: Secondary | ICD-10-CM

## 2013-08-25 DIAGNOSIS — Z01419 Encounter for gynecological examination (general) (routine) without abnormal findings: Secondary | ICD-10-CM | POA: Insufficient documentation

## 2013-08-25 DIAGNOSIS — E7800 Pure hypercholesterolemia, unspecified: Secondary | ICD-10-CM

## 2013-08-25 DIAGNOSIS — R928 Other abnormal and inconclusive findings on diagnostic imaging of breast: Secondary | ICD-10-CM

## 2013-08-25 DIAGNOSIS — K769 Liver disease, unspecified: Secondary | ICD-10-CM

## 2013-08-25 DIAGNOSIS — Z124 Encounter for screening for malignant neoplasm of cervix: Secondary | ICD-10-CM

## 2013-08-25 NOTE — Assessment & Plan Note (Signed)
Blood pressure doing well.  Follow.  Follow metabolic panel.  

## 2013-08-25 NOTE — Progress Notes (Signed)
Subjective:    Patient ID: Gabrielle Yu, female    DOB: 15-Nov-1950, 63 y.o.   MRN: [id number]  HPI 63 year old female with past history of hypertension, hypercholesterolemia and diabetes.  She comes in today to follow up on these issues as well as for a complete physical exam.    She is taking metformin 500mg  bid.  Brought in no sugar readings.  States am sugars averaging 120-140 and pm sugars averaging 160-170.  Overall appears to be doing well.  No chest pain or tightness.  No sob.  No nausea or vomiting.  No bowel change.  Feels good.  Plans to get more serious about her diet and exercise.  Scheduled to see the eye doctor (Dr Reece Leaderonnelly) this week.        Past Medical History  Diagnosis Date  . Diabetes mellitus   . Hypertension   . Hypercholesterolemia   . High cholesterol     Current Outpatient Prescriptions on File Prior to Visit  Medication Sig Dispense Refill  . hydrochlorothiazide (MICROZIDE) 12.5 MG capsule Take 1 capsule (12.5 mg total) by mouth daily.  90 capsule  1  . lisinopril (PRINIVIL,ZESTRIL) 40 MG tablet Take 1 tablet (40 mg total) by mouth daily.  90 tablet  1  . metFORMIN (GLUCOPHAGE) 500 MG tablet TAKE ONE TABLET BY MOUTH TWICE DAILY.  180 tablet  1  . pravastatin (PRAVACHOL) 20 MG tablet Take two tablets q day.  180 tablet  3   No current facility-administered medications on file prior to visit.    Review of Systems Patient denies any lightheadedness or dizziness.  No sinus or allergy problems.  No chest pain, tightness or palpitations.  No increased shortness of breath.  No cough or chest congestion.  No nausea or vomiting.  No acid reflux.  No abdominal pain or cramping.  No bowel change, such as diarrhea, constipation, BRBPR or melana.  No urine change.   Sugars as outlined.          Objective:   Physical Exam  Filed Vitals:   08/25/13 1103  BP: 130/80  Pulse: 95  Temp: 98.3 F (36.8 C)   Blood pressure recheck:  48130/1078  63 year old female in no acute  distress.   HEENT:  Nares- clear.  Oropharynx - without lesions. NECK:  Supple.  Nontender.  No audible bruit.  HEART:  Appears to be regular. LUNGS:  No crackles or wheezing audible.  Respirations even and unlabored.  RADIAL PULSE:  Equal bilaterally.    BREASTS:  No nipple discharge or nipple retraction present.  Could not appreciate any distinct nodules or axillary adenopathy.  ABDOMEN:  Soft, nontender.  Bowel sounds present and normal.  No audible abdominal bruit.  GU:  Normal external genitalia.  Vaginal vault without lesions.  Cervix identified.  Pap performed. Could not appreciate any adnexal masses or tenderness.   RECTAL:  Heme negative.   EXTREMITIES:  No increased edema present.  DP pulses palpable and equal bilaterally.      FEET:  No lesions.        Assessment & Plan:  CARDIOVASCULAR.  Has a heart murmur.  ECHO 2001 revealed trivial MR and TR with EF 55-60%.  Currently asymptomatic.    HEALTH MAINTENANCE.  Physical today.  Mammogram 3/14 - birads 4.  Referred to Dr Lemar LivingsByrnett.  Biopsy negative.  Had recommended a six month f/u mammogram.  She did not have and states she is waiting for yearly mammogram.  She is due f/u.   Discussed need for colonoscopy again today.   She declined.   She wants to check with her insurance and see if they cover mammogram.  Will notify me if she wants me to schedule.

## 2013-08-25 NOTE — Progress Notes (Signed)
Pre visit review using our clinic review tool, if applicable. No additional management support is needed unless otherwise documented below in the visit note. 

## 2013-08-25 NOTE — Assessment & Plan Note (Signed)
On pravastatin 20mg  (two tablets per day).  Tolerates the 20mg  tablets better.  Follow lipid profile and liver function.

## 2013-08-25 NOTE — Assessment & Plan Note (Signed)
She plans to adjust her diet and start exercising on a more regular basis.   Sugars as outlined.   Will continue same medication.   Eye exam scheduled for this week.  Check met b, urine microalbumin/Cr ratio and a1c.

## 2013-08-25 NOTE — Assessment & Plan Note (Signed)
Has had a history of elevated liver enzymes.  Has declined abdominal ultrasound.   AST/ALT elevated.  She continues to decline further w/up.  Diet and weight loss.  AST/ALT stable.  Recheck liver panel

## 2013-08-25 NOTE — Assessment & Plan Note (Addendum)
Mammogram 07/19/12 - recommended f/u views.  F/u mammogram 08/01/12 - Birads IV.  Referred to Dr Lemar LivingsByrnett.  Biopsy negative.  Schedule f/u mammogram.  She did not keep 6 month f/u mammogram.

## 2013-08-27 ENCOUNTER — Encounter: Payer: Self-pay | Admitting: *Deleted

## 2013-08-29 LAB — HM DIABETES EYE EXAM

## 2013-09-04 ENCOUNTER — Other Ambulatory Visit (INDEPENDENT_AMBULATORY_CARE_PROVIDER_SITE_OTHER): Payer: BC Managed Care – PPO

## 2013-09-04 ENCOUNTER — Encounter: Payer: Self-pay | Admitting: Internal Medicine

## 2013-09-04 DIAGNOSIS — R945 Abnormal results of liver function studies: Secondary | ICD-10-CM

## 2013-09-04 DIAGNOSIS — E119 Type 2 diabetes mellitus without complications: Secondary | ICD-10-CM

## 2013-09-04 DIAGNOSIS — K769 Liver disease, unspecified: Secondary | ICD-10-CM

## 2013-09-04 DIAGNOSIS — E7800 Pure hypercholesterolemia, unspecified: Secondary | ICD-10-CM

## 2013-09-04 DIAGNOSIS — I10 Essential (primary) hypertension: Secondary | ICD-10-CM

## 2013-09-04 LAB — CBC WITH DIFFERENTIAL/PLATELET
BASOS ABS: 0 10*3/uL (ref 0.0–0.1)
Basophils Relative: 0.6 % (ref 0.0–3.0)
EOS ABS: 0 10*3/uL (ref 0.0–0.7)
Eosinophils Relative: 0 % (ref 0.0–5.0)
HCT: 38.7 % (ref 36.0–46.0)
HEMOGLOBIN: 12.8 g/dL (ref 12.0–15.0)
LYMPHS PCT: 35.2 % (ref 12.0–46.0)
Lymphs Abs: 2.4 10*3/uL (ref 0.7–4.0)
MCHC: 33 g/dL (ref 30.0–36.0)
MCV: 88.8 fl (ref 78.0–100.0)
MONOS PCT: 9.4 % (ref 3.0–12.0)
Monocytes Absolute: 0.6 10*3/uL (ref 0.1–1.0)
NEUTROS ABS: 3.8 10*3/uL (ref 1.4–7.7)
NEUTROS PCT: 54.8 % (ref 43.0–77.0)
Platelets: 287 10*3/uL (ref 150.0–400.0)
RBC: 4.36 Mil/uL (ref 3.87–5.11)
RDW: 13.5 % (ref 11.5–15.5)
WBC: 6.9 10*3/uL (ref 4.0–10.5)

## 2013-09-04 LAB — BASIC METABOLIC PANEL
BUN: 17 mg/dL (ref 6–23)
CHLORIDE: 102 meq/L (ref 96–112)
CO2: 28 meq/L (ref 19–32)
CREATININE: 1 mg/dL (ref 0.4–1.2)
Calcium: 9.2 mg/dL (ref 8.4–10.5)
GFR: 59.47 mL/min — ABNORMAL LOW (ref >60.00)
Glucose, Bld: 132 mg/dL — ABNORMAL HIGH (ref 70–99)
Potassium: 4.4 mEq/L (ref 3.5–5.1)
SODIUM: 138 meq/L (ref 135–145)

## 2013-09-04 LAB — LIPID PANEL
CHOLESTEROL: 200 mg/dL (ref 0–200)
HDL: 39.3 mg/dL (ref >39.00)
LDL CALC: 136 mg/dL — AB (ref 0–99)
TRIGLYCERIDES: 125 mg/dL (ref 0.0–149.0)
Total CHOL/HDL Ratio: 5
VLDL: 25 mg/dL (ref 0.0–40.0)

## 2013-09-04 LAB — TSH: TSH: 1.14 u[IU]/mL (ref 0.35–4.50)

## 2013-09-04 LAB — MICROALBUMIN / CREATININE URINE RATIO
Creatinine,U: 113.5 mg/dL
MICROALB/CREAT RATIO: 0.1 mg/g (ref 0.0–30.0)
Microalb, Ur: 0.1 mg/dL (ref 0.0–1.9)

## 2013-09-04 LAB — HEMOGLOBIN A1C: HEMOGLOBIN A1C: 8.5 % — AB (ref 4.6–6.5)

## 2013-09-04 LAB — HEPATIC FUNCTION PANEL
ALT: 45 U/L — AB (ref 0–35)
AST: 27 U/L (ref 0–37)
Albumin: 4.2 g/dL (ref 3.5–5.2)
Alkaline Phosphatase: 50 U/L (ref 39–117)
BILIRUBIN DIRECT: 0 mg/dL (ref 0.0–0.3)
TOTAL PROTEIN: 7.1 g/dL (ref 6.0–8.3)
Total Bilirubin: 0.4 mg/dL (ref 0.2–1.2)

## 2013-09-22 ENCOUNTER — Encounter: Payer: Self-pay | Admitting: Internal Medicine

## 2013-09-22 ENCOUNTER — Ambulatory Visit (INDEPENDENT_AMBULATORY_CARE_PROVIDER_SITE_OTHER): Payer: BC Managed Care – PPO | Admitting: Internal Medicine

## 2013-09-22 VITALS — BP 120/60 | HR 79 | Temp 98.2°F | Ht 64.5 in | Wt 169.5 lb

## 2013-09-22 DIAGNOSIS — K769 Liver disease, unspecified: Secondary | ICD-10-CM

## 2013-09-22 DIAGNOSIS — E7800 Pure hypercholesterolemia, unspecified: Secondary | ICD-10-CM

## 2013-09-22 DIAGNOSIS — R928 Other abnormal and inconclusive findings on diagnostic imaging of breast: Secondary | ICD-10-CM

## 2013-09-22 DIAGNOSIS — E119 Type 2 diabetes mellitus without complications: Secondary | ICD-10-CM

## 2013-09-22 DIAGNOSIS — R945 Abnormal results of liver function studies: Secondary | ICD-10-CM

## 2013-09-22 DIAGNOSIS — I10 Essential (primary) hypertension: Secondary | ICD-10-CM

## 2013-09-22 MED ORDER — METFORMIN HCL 500 MG PO TABS: ORAL_TABLET | ORAL | Status: DC

## 2013-09-22 NOTE — Assessment & Plan Note (Signed)
Has had a history of elevated liver enzymes.  Has declined abdominal ultrasound.   ALT slightly elevated - recent check.   She continues to decline further w/up.  Diet and weight loss.  Follow liver panel

## 2013-09-22 NOTE — Progress Notes (Signed)
Pre visit review using our clinic review tool, if applicable. No additional management support is needed unless otherwise documented below in the visit note. 

## 2013-09-22 NOTE — Assessment & Plan Note (Signed)
Mammogram 07/19/12 - recommended f/u views.  F/u mammogram 08/01/12 - Birads IV.  Referred to Dr Lemar Livings.  Biopsy negative.  Schedule f/u mammogram.  She did not keep 6 month f/u mammogram.  See above.

## 2013-09-22 NOTE — Assessment & Plan Note (Signed)
She plans to adjust her diet more and start exercising on a more regular basis.   Sugars as outlined.   A1c 09/04/13 - 8.5.  Will increase metformin to 500mg  bid.  Follow sugars.  Check at least bid.  Send in readings and get her back in soon to reassess.  Just had eye evaluation.  No diabetic changes.

## 2013-09-22 NOTE — Assessment & Plan Note (Signed)
Blood pressure doing well.  Follow.  Follow metabolic panel.  

## 2013-09-22 NOTE — Assessment & Plan Note (Signed)
On pravastatin 20mg  (two tablets per day).  Tolerates the 20mg  tablets better.  Follow lipid profile and liver function.  Lipid panel just checked 09/04/13 - total cholesterol 200, triglycerides 125, HDL 39 and LDL 136.

## 2013-09-22 NOTE — Progress Notes (Signed)
  Subjective:    Patient ID: Gabrielle Yu, female    DOB: 07-19-1950, 63 y.o.   MRN: [id number]  HPI 63 year old female with past history of hypertension, hypercholesterolemia and diabetes.  She comes in today for a scheduled follow up.  Here to discuss her sugars.   She is taking metformin 500mg  bid.  Brought in a few sugar readings.  Only brought in am sugars.  AM sugars averaging 120-140.  Not checking pm sugars.   Overall appears to be doing well.  No chest pain or tightness.  No sob.  No nausea or vomiting.  No bowel change.  Feels good.  Plans to get more serious about her diet and exercise.  Saw the eye doctor (Dr Reece Leader).  No diabetic changes.  Has cataract.  Planning for surgery this summer.         Past Medical History  Diagnosis Date  . Diabetes mellitus   . Hypertension   . Hypercholesterolemia   . High cholesterol     Current Outpatient Prescriptions on File Prior to Visit  Medication Sig Dispense Refill  . hydrochlorothiazide (MICROZIDE) 12.5 MG capsule Take 1 capsule (12.5 mg total) by mouth daily.  90 capsule  1  . lisinopril (PRINIVIL,ZESTRIL) 40 MG tablet Take 1 tablet (40 mg total) by mouth daily.  90 tablet  1  . pravastatin (PRAVACHOL) 20 MG tablet Take two tablets q day.  180 tablet  3   No current facility-administered medications on file prior to visit.    Review of Systems Patient denies any lightheadedness or dizziness.  No sinus or allergy problems.  No chest pain, tightness or palpitations.  No increased shortness of breath.  No cough or chest congestion.  No nausea or vomiting.  No acid reflux.  No abdominal pain or cramping.  No bowel change, such as diarrhea, constipation, BRBPR or melana.  No urine change.   Sugars as outlined.          Objective:   Physical Exam  Filed Vitals:   09/22/13 1105  BP: 120/60  Pulse: 79  Temp: 98.2 F (78.84 C)   63 year old female in no acute distress.   HEENT:  Nares- clear.  Oropharynx - without lesions. NECK:   Supple.  Nontender.  No audible bruit.  HEART:  Appears to be regular. LUNGS:  No crackles or wheezing audible.  Respirations even and unlabored.  RADIAL PULSE:  Equal bilaterally.       Assessment & Plan:  CARDIOVASCULAR.  Has a heart murmur.  ECHO 2001 revealed trivial MR and TR with EF 55-60%.  Currently asymptomatic.    HEALTH MAINTENANCE.  Physical last visit.  Pap 08/25/13 negative with negative HPV.  Mammogram 3/14 - birads 4.  Referred to Dr Lemar Livings.  Biopsy negative.  Had recommended a six month f/u mammogram.  She did not have and states she is waiting for yearly mammogram.  She is due f/u.   Have discussed need for colonoscopy.   She declined.   She was to check with her insurance and see if they cover mammogram.  Will notify me if she wants me to schedule.

## 2013-10-02 ENCOUNTER — Other Ambulatory Visit: Payer: Self-pay | Admitting: Internal Medicine

## 2013-10-27 ENCOUNTER — Telehealth: Payer: Self-pay | Admitting: Internal Medicine

## 2013-10-27 NOTE — Telephone Encounter (Signed)
Please advise 

## 2013-10-27 NOTE — Telephone Encounter (Signed)
Called with msg for Dr. Lorin PicketScott; pt did not want to leave msg on vm.  States she is to have cataract surgery on Wednesday and she asked them if she could have some type of pill at least before she left that morning for anxiety.  Stated they would not be able to do that for her but that her primary care doctor could.  She wanted to see if Dr. Lorin PicketScott can prescribe something for anxiety just the morning of her surgery.

## 2013-10-28 ENCOUNTER — Other Ambulatory Visit: Payer: Self-pay | Admitting: *Deleted

## 2013-10-28 MED ORDER — LORAZEPAM 0.5 MG PO TABS: ORAL_TABLET | ORAL | Status: DC

## 2013-10-28 NOTE — Telephone Encounter (Signed)
Pt notified & Rs sent to pharmacy

## 2013-10-28 NOTE — Telephone Encounter (Signed)
Pt states she has not tried any medications previously.  States she has only had a reaction to Zoloft in the past (hives).  Pt would like a call with details of medication that will be prescribed and is very appreciative.

## 2013-10-28 NOTE — Telephone Encounter (Signed)
Has she ever taken any medication like this previously (i.e, xanax, ativan,etc).  If so, let me know what she took and if tolerated.  If she has never taken one of these medications, then can try ativan (lorazepam) .5mg  and instruct her to take 1/2 tablet prior to procedure.   She will need someone to drive her.

## 2013-10-29 ENCOUNTER — Ambulatory Visit: Payer: Self-pay | Admitting: Ophthalmology

## 2013-11-18 ENCOUNTER — Ambulatory Visit (INDEPENDENT_AMBULATORY_CARE_PROVIDER_SITE_OTHER): Payer: BC Managed Care – PPO | Admitting: Internal Medicine

## 2013-11-18 ENCOUNTER — Encounter: Payer: Self-pay | Admitting: Internal Medicine

## 2013-11-18 VITALS — BP 120/70 | HR 84 | Temp 98.0°F | Ht 64.5 in | Wt 169.5 lb

## 2013-11-18 DIAGNOSIS — E7800 Pure hypercholesterolemia, unspecified: Secondary | ICD-10-CM

## 2013-11-18 DIAGNOSIS — K769 Liver disease, unspecified: Secondary | ICD-10-CM

## 2013-11-18 DIAGNOSIS — I10 Essential (primary) hypertension: Secondary | ICD-10-CM

## 2013-11-18 DIAGNOSIS — R945 Abnormal results of liver function studies: Secondary | ICD-10-CM

## 2013-11-18 DIAGNOSIS — E119 Type 2 diabetes mellitus without complications: Secondary | ICD-10-CM

## 2013-11-18 DIAGNOSIS — R928 Other abnormal and inconclusive findings on diagnostic imaging of breast: Secondary | ICD-10-CM

## 2013-11-18 NOTE — Progress Notes (Signed)
Pre visit review using our clinic review tool, if applicable. No additional management support is needed unless otherwise documented below in the visit note. 

## 2013-11-22 ENCOUNTER — Encounter: Payer: Self-pay | Admitting: Internal Medicine

## 2013-11-22 NOTE — Assessment & Plan Note (Signed)
Has had a history of elevated liver enzymes.  Has declined abdominal ultrasound.   ALT slightly elevated - recent check.   She continues to decline further w/up.  Diet and weight loss.  Follow liver panel   

## 2013-11-22 NOTE — Progress Notes (Signed)
  Subjective:    Patient ID: Gabrielle Yu, female    DOB: March 05, 1951, 63 y.o.   MRN: [id number]  HPI 63 year old female with past history of hypertension, hypercholesterolemia and diabetes.  She comes in today for a scheduled follow up.  Here to discuss her sugars.   She is taking metformin.  Sugars better.  See her attached list for details.  AM sugars averaging 120-140.  PM sugars averaging 100-140.     Overall appears to be doing well.  No chest pain or tightness.  No sob.  No nausea or vomiting.  No bowel change.  Feels good.  Doing better with diet and exercise.  Saw the eye doctor (Dr Reece Leaderonnelly).  No diabetic changes.          Past Medical History  Diagnosis Date  . Diabetes mellitus   . Hypertension   . Hypercholesterolemia   . High cholesterol     Current Outpatient Prescriptions on File Prior to Visit  Medication Sig Dispense Refill  . hydrochlorothiazide (MICROZIDE) 12.5 MG capsule Take 1 capsule (12.5 mg total) by mouth daily.  90 capsule  1  . lisinopril (PRINIVIL,ZESTRIL) 40 MG tablet Take 1 tablet (40 mg total) by mouth daily.  90 tablet  1  . LORazepam (ATIVAN) 0.5 MG tablet Take 1/2 tablet hour prior to procedure. Take remaining tablets to appointment.  2 tablet  0  . metFORMIN (GLUCOPHAGE) 500 MG tablet Take 2 (500mg ) tablets twice a day  360 tablet  1  . pravastatin (PRAVACHOL) 20 MG tablet Take two tablets q day.  180 tablet  3   No current facility-administered medications on file prior to visit.    Review of Systems Patient denies any lightheadedness or dizziness.  No sinus or allergy problems.  No chest pain, tightness or palpitations.  No increased shortness of breath.  No cough or chest congestion.  No nausea or vomiting.  No acid reflux.  No abdominal pain or cramping.  No bowel change, such as diarrhea, constipation, BRBPR or melana.  No urine change.   Sugars as outlined.          Objective:   Physical Exam  Filed Vitals:   11/18/13 1130  BP: 120/70   Pulse: 84  Temp: 98 F (36.7 C)   Blood pressure recheck:  46114/1272  63 year old female in no acute distress.   HEENT:  Nares- clear.  Oropharynx - without lesions. NECK:  Supple.  Nontender.  No audible bruit.  HEART:  Appears to be regular. LUNGS:  No crackles or wheezing audible.  Respirations even and unlabored.  RADIAL PULSE:  Equal bilaterally.   ABDOMEN:  Soft, non tender.  No audible abdominal bruit.   EXTREMITIES:  No increased edema present.       Assessment & Plan:  CARDIOVASCULAR.  Has a heart murmur.  ECHO 2001 revealed trivial MR and TR with EF 55-60%.  Currently asymptomatic.    HEALTH MAINTENANCE.  Physical 08/25/13.   Pap 08/25/13 negative with negative HPV.  Mammogram 3/14 - birads 4.  Referred to Dr Lemar LivingsByrnett.  Biopsy negative.  Had recommended a six month f/u mammogram.  She did not have and states she is waiting for yearly mammogram.  She is due f/u.   Have discussed need for colonoscopy.   She declined.   She was to check with her insurance and see if they cover mammogram.  Will notify me if she wants me to schedule.

## 2013-11-22 NOTE — Assessment & Plan Note (Signed)
Blood pressure doing well.  Follow.  Follow metabolic panel.  

## 2013-11-22 NOTE — Assessment & Plan Note (Signed)
On metformin.  Doing better with diet.  Discussed importance of diet and exercise.  Sugars better.   Just had eye evaluation.  No diabetic changes.

## 2013-11-22 NOTE — Assessment & Plan Note (Signed)
Mammogram 07/19/12 - recommended f/u views.  F/u mammogram 08/01/12 - Birads IV.  Referred to Dr Lemar LivingsByrnett.  Biopsy negative.  Schedule f/u mammogram.  She did not keep 6 month f/u mammogram.  See above.  Will notify me when agreeable to schedule.

## 2013-11-22 NOTE — Assessment & Plan Note (Addendum)
On pravastatin 20mg (two tablets per day).  Tolerates the 20mg tablets better.  Follow lipid profile and liver function.  Lipid panel just checked 09/04/13 - total cholesterol 200, triglycerides 125, HDL 39 and LDL 136.       

## 2013-11-25 ENCOUNTER — Ambulatory Visit: Payer: BC Managed Care – PPO | Admitting: Internal Medicine

## 2014-01-21 ENCOUNTER — Other Ambulatory Visit (INDEPENDENT_AMBULATORY_CARE_PROVIDER_SITE_OTHER): Payer: BC Managed Care – PPO

## 2014-01-21 DIAGNOSIS — E119 Type 2 diabetes mellitus without complications: Secondary | ICD-10-CM

## 2014-01-21 DIAGNOSIS — E7800 Pure hypercholesterolemia, unspecified: Secondary | ICD-10-CM

## 2014-01-21 LAB — HEPATIC FUNCTION PANEL
ALK PHOS: 47 U/L (ref 39–117)
ALT: 59 U/L — ABNORMAL HIGH (ref 0–35)
AST: 42 U/L — AB (ref 0–37)
Albumin: 4.2 g/dL (ref 3.5–5.2)
BILIRUBIN DIRECT: 0.1 mg/dL (ref 0.0–0.3)
TOTAL PROTEIN: 7.2 g/dL (ref 6.0–8.3)
Total Bilirubin: 0.6 mg/dL (ref 0.2–1.2)

## 2014-01-21 LAB — LIPID PANEL
CHOLESTEROL: 167 mg/dL (ref 0–200)
HDL: 34.2 mg/dL — AB (ref >39.00)
LDL Cholesterol: 109 mg/dL — ABNORMAL HIGH (ref 0–99)
NONHDL: 132.8
Total CHOL/HDL Ratio: 5
Triglycerides: 117 mg/dL (ref 0.0–149.0)
VLDL: 23.4 mg/dL (ref 0.0–40.0)

## 2014-01-21 LAB — BASIC METABOLIC PANEL
BUN: 16 mg/dL (ref 6–23)
CALCIUM: 8.9 mg/dL (ref 8.4–10.5)
CO2: 27 mEq/L (ref 19–32)
CREATININE: 1 mg/dL (ref 0.4–1.2)
Chloride: 106 mEq/L (ref 96–112)
GFR: 58.72 mL/min — ABNORMAL LOW (ref >60.00)
Glucose, Bld: 117 mg/dL — ABNORMAL HIGH (ref 70–99)
Potassium: 4.2 mEq/L (ref 3.5–5.1)
Sodium: 141 mEq/L (ref 135–145)

## 2014-01-21 LAB — HEMOGLOBIN A1C: Hgb A1c MFr Bld: 7.6 % — ABNORMAL HIGH (ref 4.6–6.5)

## 2014-01-29 ENCOUNTER — Encounter: Payer: Self-pay | Admitting: Internal Medicine

## 2014-01-29 ENCOUNTER — Ambulatory Visit (INDEPENDENT_AMBULATORY_CARE_PROVIDER_SITE_OTHER): Payer: BC Managed Care – PPO | Admitting: Internal Medicine

## 2014-01-29 VITALS — BP 110/60 | HR 88 | Temp 98.3°F | Ht 64.5 in | Wt 168.5 lb

## 2014-01-29 DIAGNOSIS — R945 Abnormal results of liver function studies: Secondary | ICD-10-CM

## 2014-01-29 DIAGNOSIS — E7800 Pure hypercholesterolemia, unspecified: Secondary | ICD-10-CM

## 2014-01-29 DIAGNOSIS — E119 Type 2 diabetes mellitus without complications: Secondary | ICD-10-CM

## 2014-01-29 DIAGNOSIS — Z23 Encounter for immunization: Secondary | ICD-10-CM

## 2014-01-29 DIAGNOSIS — I10 Essential (primary) hypertension: Secondary | ICD-10-CM

## 2014-01-29 DIAGNOSIS — R928 Other abnormal and inconclusive findings on diagnostic imaging of breast: Secondary | ICD-10-CM

## 2014-01-29 DIAGNOSIS — K7689 Other specified diseases of liver: Secondary | ICD-10-CM

## 2014-01-29 NOTE — Progress Notes (Signed)
  Subjective:    Patient ID: Gabrielle Yu, female    DOB: 01-16-1951, 63 y.o.   MRN: [id number]  HPI 63 year old female with past history of hypertension, hypercholesterolemia and diabetes.  She comes in today for a scheduled follow up.  She is taking metformin.  Sugars better.  She did not bring in any sugar readings.  States her blood sugars in the morning averaging 120-135.  A1c still higher than goal.  No chest pain or tightness.  No sob.  No nausea or vomiting.  No bowel change.  Feels good.  Has seen the eye doctor (Dr Reece Leaderonnelly).  No diabetic changes.  She is not exercising.  She plans to start.  We discussed her elevated liver tests.  Discussed the need for abdominal ultrasound.        Past Medical History  Diagnosis Date  . Diabetes mellitus   . Hypertension   . Hypercholesterolemia   . High cholesterol     Current Outpatient Prescriptions on File Prior to Visit  Medication Sig Dispense Refill  . hydrochlorothiazide (MICROZIDE) 12.5 MG capsule Take 1 capsule (12.5 mg total) by mouth daily.  90 capsule  1  . lisinopril (PRINIVIL,ZESTRIL) 40 MG tablet Take 1 tablet (40 mg total) by mouth daily.  90 tablet  1  . LORazepam (ATIVAN) 0.5 MG tablet Take 1/2 tablet hour prior to procedure. Take remaining tablets to appointment.  2 tablet  0  . metFORMIN (GLUCOPHAGE) 500 MG tablet Take 2 (500mg ) tablets twice a day  360 tablet  1  . pravastatin (PRAVACHOL) 20 MG tablet Take two tablets q day.  180 tablet  3   No current facility-administered medications on file prior to visit.    Review of Systems Patient denies any lightheadedness or dizziness.  No sinus or allergy problems.  No chest pain, tightness or palpitations.  No increased shortness of breath.  No cough or chest congestion.  No nausea or vomiting.  No acid reflux.  No abdominal pain or cramping.  No bowel change, such as diarrhea, constipation, BRBPR or melana.  No urine change.   Sugars as outlined.          Objective:   Physical Exam  Filed Vitals:   01/29/14 1058  BP: 110/60  Pulse: 88  Temp: 98.3 F (36.8 C)   Blood pressure recheck:  25120/6474  63 year old female in no acute distress.   HEENT:  Nares- clear.  Oropharynx - without lesions. NECK:  Supple.  Nontender.  No audible bruit.  HEART:  Appears to be regular. LUNGS:  No crackles or wheezing audible.  Respirations even and unlabored.  RADIAL PULSE:  Equal bilaterally.   ABDOMEN:  Soft, non tender.  No audible abdominal bruit.   EXTREMITIES:  No increased edema present.       Assessment & Plan:  CARDIOVASCULAR.  Has a heart murmur.  ECHO 2001 revealed trivial MR and TR with EF 55-60%. Currently asymptomatic.    HEALTH MAINTENANCE.  Physical 08/25/13.   Pap 08/25/13 negative with negative HPV.  Mammogram 3/14 - birads 4.  Referred to Dr Lemar LivingsByrnett.  Biopsy negative.  Had recommended a six month f/u mammogram.  She did not have and states she is waiting for yearly mammogram.  Overdue f/u mammogram.   Have discussed need for colonoscopy.   She has declined.

## 2014-01-29 NOTE — Assessment & Plan Note (Signed)
Mammogram 07/19/12 - recommended f/u views.  F/u mammogram 08/01/12 - Birads IV.  Referred to Dr Lemar LivingsByrnett.  Biopsy negative.  Schedule f/u mammogram.  She did not keep 6 month f/u mammogram.  See above.  Need to schedule f/u mammogram.

## 2014-01-29 NOTE — Progress Notes (Signed)
Pre visit review using our clinic review tool, if applicable. No additional management support is needed unless otherwise documented below in the visit note. 

## 2014-01-29 NOTE — Assessment & Plan Note (Signed)
Has had a history of elevated liver enzymes.  Discussed diet and weight loss.  Follow liver panel.  Discussed with her again today regarding obtaining an abdominal ultrasound.  She agreed.  Will schedule.

## 2014-01-29 NOTE — Assessment & Plan Note (Signed)
On pravastatin.  Follow lipid profile and liver function.

## 2014-01-29 NOTE — Assessment & Plan Note (Signed)
Blood pressure doing well.  Follow.  Follow metabolic panel.  

## 2014-01-29 NOTE — Assessment & Plan Note (Signed)
On metformin.  Doing better with diet.  Discussed importance of diet and exercise.  Sugars better.   Had eye evaluation.  No diabetic changes.

## 2014-02-02 ENCOUNTER — Ambulatory Visit: Payer: Self-pay | Admitting: Internal Medicine

## 2014-02-04 ENCOUNTER — Telehealth: Payer: Self-pay | Admitting: Internal Medicine

## 2014-02-04 NOTE — Telephone Encounter (Signed)
Notify pt that her abdominal ultrasound reveals some changes in the liver that could be c/w fatty liver.  Need to confirm all due to fatty liver.  Needs referral to GI for further evaluation and treatment.  If agreeable, let me know and I will place the order for the referral.

## 2014-02-05 NOTE — Telephone Encounter (Signed)
Called and advised patient of results,  verbalized understanding. Wants to "think about referral" and will call back to let us know her decision.

## 2014-02-17 ENCOUNTER — Other Ambulatory Visit: Payer: Self-pay | Admitting: Internal Medicine

## 2014-02-23 ENCOUNTER — Encounter: Payer: Self-pay | Admitting: Internal Medicine

## 2014-04-06 ENCOUNTER — Encounter: Payer: Self-pay | Admitting: Internal Medicine

## 2014-05-28 ENCOUNTER — Other Ambulatory Visit: Payer: BC Managed Care – PPO

## 2014-06-02 ENCOUNTER — Ambulatory Visit: Payer: BC Managed Care – PPO | Admitting: Internal Medicine

## 2014-10-13 ENCOUNTER — Other Ambulatory Visit: Payer: Self-pay | Admitting: Internal Medicine

## 2015-06-16 ENCOUNTER — Ambulatory Visit (INDEPENDENT_AMBULATORY_CARE_PROVIDER_SITE_OTHER): Payer: BC Managed Care – PPO | Admitting: Internal Medicine

## 2015-06-16 ENCOUNTER — Encounter: Payer: Self-pay | Admitting: Internal Medicine

## 2015-06-16 VITALS — BP 128/90 | HR 82 | Temp 98.2°F | Resp 18 | Ht 64.5 in | Wt 162.2 lb

## 2015-06-16 DIAGNOSIS — E119 Type 2 diabetes mellitus without complications: Secondary | ICD-10-CM

## 2015-06-16 DIAGNOSIS — I10 Essential (primary) hypertension: Secondary | ICD-10-CM

## 2015-06-16 DIAGNOSIS — Z1239 Encounter for other screening for malignant neoplasm of breast: Secondary | ICD-10-CM

## 2015-06-16 DIAGNOSIS — E7800 Pure hypercholesterolemia, unspecified: Secondary | ICD-10-CM | POA: Diagnosis not present

## 2015-06-16 DIAGNOSIS — R221 Localized swelling, mass and lump, neck: Secondary | ICD-10-CM

## 2015-06-16 DIAGNOSIS — R945 Abnormal results of liver function studies: Secondary | ICD-10-CM

## 2015-06-16 DIAGNOSIS — R928 Other abnormal and inconclusive findings on diagnostic imaging of breast: Secondary | ICD-10-CM

## 2015-06-16 DIAGNOSIS — K7689 Other specified diseases of liver: Secondary | ICD-10-CM

## 2015-06-16 MED ORDER — CEPHALEXIN 500 MG PO CAPS: 500.0000 mg | ORAL_CAPSULE | Freq: Three times a day (TID) | ORAL | Status: DC

## 2015-06-16 NOTE — Patient Instructions (Signed)
Take a probiotic (align) one per day while on antibiotics and for two weeks after complete antibiotics.

## 2015-06-16 NOTE — Progress Notes (Signed)
Patient ID: Gabrielle Yu, female   DOB: [birth year], 65 y.o.   MRN: [id number]   Subjective:    Patient ID: Gabrielle Yu, female    DOB: [birth year], 65 y.o.   MRN: [id number]  HPI  Patient with past history of hypercholesterolemia, diabetes and hypertension.  She comes in today as a work in with concerns regarding a palpable nodule - posterior neck.  Has been present for months.  Increasing in size.  More pressure this past week.  No other nodules palpable.  I have not seen her since 01/2014.  She has not been taking her medications regularly.  Not checking her sugar.  Over this last week, just started back on her medication.  Not taking HCTZ.  She is taking metformin.  Not taking the pm dose intermittently - over the last week.  No nausea or vomiting.  Bowels stable.  No chest pain or sob.     Past Medical History  Diagnosis Date  . Diabetes mellitus ([place])   . Hypertension   . Hypercholesterolemia   . High cholesterol    Past Surgical History  Procedure Laterality Date  . Tonsillectomy and adenoidectomy  1958  . Tubal ligation  1982   Family History  Problem Relation Age of Onset  . Breast cancer Mother   . Heart disease Father     myocardial infarction - died age 69  . Heart disease      paternal aunts and uncles  . Colon cancer Neg Hx   . Hypertension Mother    Social History   Social History  . Marital Status: Married    Spouse Name: N/A  . Number of Children: 2  . Years of Education: N/A   Occupational History  .     Social History Main Topics  . Smoking status: Never Smoker   . Smokeless tobacco: Never Used  . Alcohol Use: No  . Drug Use: No  . Sexual Activity: Not Asked   Other Topics Concern  . None   Social History Narrative    Outpatient Encounter Prescriptions as of 06/16/2015  Medication Sig  . hydrochlorothiazide (MICROZIDE) 12.5 MG capsule TAKE ONE CAPSULE BY MOUTH ONCE DAILY  . lisinopril (PRINIVIL,ZESTRIL) 40 MG tablet TAKE ONE  TABLET BY MOUTH ONCE DAILY  . LORazepam (ATIVAN) 0.5 MG tablet Take 1/2 tablet hour prior to procedure. Take remaining tablets to appointment.  . metFORMIN (GLUCOPHAGE) 500 MG tablet TAKE TWO TABLETS BY MOUTH TWICE DAILY  . pravastatin (PRAVACHOL) 20 MG tablet TAKE TWO TABLETS BY MOUTH ONCE DAILY  . cephALEXin (KEFLEX) 500 MG capsule Take 1 capsule (500 mg total) by mouth 3 (three) times daily.   No facility-administered encounter medications on file as of 06/16/2015.    Review of Systems  Constitutional: Negative for fever and appetite change.  HENT: Negative for congestion and sore throat.   Respiratory: Negative for cough, chest tightness and shortness of breath.   Cardiovascular: Negative for chest pain, palpitations and leg swelling.  Gastrointestinal: Negative for nausea, vomiting, abdominal pain and diarrhea.  Musculoskeletal: Negative for myalgias and joint swelling.  Skin: Negative for color change and rash.  Neurological: Negative for dizziness, light-headedness and headaches.       Objective:    Physical Exam  Constitutional: She appears well-developed and well-nourished. No distress.  HENT:  Nose: Nose normal.  Mouth/Throat: Oropharynx is clear and moist.  Eyes: Conjunctivae are normal. Right eye exhibits no discharge. Left eye exhibits no discharge.  Neck:  Neck supple. No thyromegaly present.  Palpable soft tissue nodule - posterior neck.  Minimal tenderness.  No increased warmth.    Cardiovascular: Normal rate and regular rhythm.   Pulmonary/Chest: Breath sounds normal. No respiratory distress. She has no wheezes.  Abdominal: Soft. Bowel sounds are normal. There is no tenderness.  Musculoskeletal: She exhibits no edema or tenderness.  Lymphadenopathy:    She has no cervical adenopathy.  Skin: Skin is warm. No rash noted.  Psychiatric: She has a normal mood and affect. Her behavior is normal.    BP 128/90 mmHg  Pulse 82  Temp(Src) 98.2 F (36.8 C) (Oral)  Resp  18  Ht 5' 4.5" (1.638 m)  Wt 162 lb 4 oz (73.596 kg)  BMI 27.43 kg/m2  SpO2 98% Wt Readings from Last 3 Encounters:  06/16/15 162 lb 4 oz (73.596 kg)  01/29/14 168 lb 8 oz (76.431 kg)  11/18/13 169 lb 8 oz (76.885 kg)     Lab Results  Component Value Date   WBC 6.9 09/04/2013   HGB 12.8 09/04/2013   HCT 38.7 09/04/2013   PLT 287.0 09/04/2013   GLUCOSE 117* 01/21/2014   CHOL 167 01/21/2014   TRIG 117.0 01/21/2014   HDL 34.20* 01/21/2014   LDLDIRECT 157.1 01/21/2013   LDLCALC 109* 01/21/2014   ALT 59* 01/21/2014   AST 42* 01/21/2014   NA 141 01/21/2014   K 4.2 01/21/2014   CL 106 01/21/2014   CREATININE 1.0 01/21/2014   BUN 16 01/21/2014   CO2 27 01/21/2014   TSH 1.14 09/04/2013   HGBA1C 7.6* 01/21/2014   MICROALBUR 0.1 09/04/2013       Assessment & Plan:   Problem List Items Addressed This Visit    Abnormal liver function    Had ultrasound that revealed changes that could be c/w fatty liver.  Diet and exercise.  Need to get sugars under better control.   Recheck liver panel.        Relevant Orders   Hepatic function panel   Abnormal mammogram    Had previous abnormal mammogram.  Saw Dr Bary Castilla.  Had biopsy - ok.  Overdue mammogram.   Need to schedule.        Diabetes mellitus ([place])    Discussed the need to check sugars regularly and take her medication on a regular basis.  Low carb diet and exercise.  Check met b and a1c.        Relevant Orders   Hemoglobin M4Q   Basic metabolic panel   Microalbumin / creatinine urine ratio   Hypercholesteremia    Low cholesterol diet and exercise.  Follow lipid panel and liver function tests.  Back on pravastatin.        Relevant Orders   Lipid panel   Hypertension    Blood pressure as outlined.  Remain off hctz.  Follow pressures.  Check metabolic panel.  Overdue labs.        Relevant Orders   CBC with Differential/Platelet   TSH   Neck nodule    Exam as outlined - posterior neck.  Will place on keflex.  Warm  compresses.  Refer to Dr Bary Castilla for evaluation and further treatment.         Other Visit Diagnoses    Breast cancer screening    -  Primary    Relevant Orders    MM DIGITAL SCREENING BILATERAL        Einar Pheasant, MD

## 2015-06-16 NOTE — Progress Notes (Signed)
Pre-visit discussion using our clinic review tool. No additional management support is needed unless otherwise documented below in the visit note.  

## 2015-06-17 ENCOUNTER — Encounter: Payer: Self-pay | Admitting: Internal Medicine

## 2015-06-17 DIAGNOSIS — R221 Localized swelling, mass and lump, neck: Secondary | ICD-10-CM | POA: Insufficient documentation

## 2015-06-17 NOTE — Assessment & Plan Note (Signed)
Discussed the need to check sugars regularly and take her medication on a regular basis.  Low carb diet and exercise.  Check met b and a1c.

## 2015-06-17 NOTE — Assessment & Plan Note (Signed)
Blood pressure as outlined.  Remain off hctz.  Follow pressures.  Check metabolic panel.  Overdue labs.

## 2015-06-17 NOTE — Assessment & Plan Note (Signed)
Had ultrasound that revealed changes that could be c/w fatty liver.  Diet and exercise.  Need to get sugars under better control.   Recheck liver panel.

## 2015-06-17 NOTE — Assessment & Plan Note (Signed)
Exam as outlined - posterior neck.  Will place on keflex.  Warm compresses.  Refer to Dr Lemar Livings for evaluation and further treatment.

## 2015-06-17 NOTE — Assessment & Plan Note (Signed)
Low cholesterol diet and exercise.  Follow lipid panel and liver function tests.  Back on pravastatin.

## 2015-06-17 NOTE — Assessment & Plan Note (Signed)
Had previous abnormal mammogram.  Saw Dr Lemar Livings.  Had biopsy - ok.  Overdue mammogram.   Need to schedule.

## 2015-06-18 ENCOUNTER — Telehealth: Payer: Self-pay | Admitting: Internal Medicine

## 2015-06-18 DIAGNOSIS — R221 Localized swelling, mass and lump, neck: Secondary | ICD-10-CM

## 2015-06-18 NOTE — Telephone Encounter (Signed)
Spoke with patient, she is concerned that she hadn't heard anything about referral to Dr. Lemar Livings yet, I explained that a referral may take a week or two.  I see in the OV note that you wanted to do the referral, but I don't see a referral order.  Please advise.  Thanks  Referral for Neck nodule?

## 2015-06-18 NOTE — Telephone Encounter (Signed)
Order placed for referral.  Someone should be contacting her with an appt as you advised.  Thanks

## 2015-06-18 NOTE — Telephone Encounter (Signed)
The patient left a voicemail . The patient asked to have someone to call her she has a follow up question. Did not state the question.

## 2015-06-24 ENCOUNTER — Encounter: Payer: Self-pay | Admitting: General Surgery

## 2015-06-24 ENCOUNTER — Ambulatory Visit (INDEPENDENT_AMBULATORY_CARE_PROVIDER_SITE_OTHER): Payer: BC Managed Care – PPO | Admitting: General Surgery

## 2015-06-24 VITALS — BP 174/84 | HR 94 | Resp 14 | Ht 64.0 in | Wt 163.0 lb

## 2015-06-24 DIAGNOSIS — L723 Sebaceous cyst: Secondary | ICD-10-CM | POA: Diagnosis not present

## 2015-06-24 MED ORDER — HYDROCODONE-ACETAMINOPHEN 5-325 MG PO TABS: 1.0000 | ORAL_TABLET | Freq: Four times a day (QID) | ORAL | Status: DC | PRN

## 2015-06-24 NOTE — Progress Notes (Signed)
Patient ID: Gabrielle Yu, female   DOB: Aug 19, 1950, 65 y.o.   MRN: [id number]  Chief Complaint  Patient presents with  . Other    neck mass    HPI Gabrielle Yu is a 65 y.o. female here today for a evaluation of a neck mass. Patient states she noticed this area about 1 year ago. She states about three weeks ago the area began to bigger and become symptomatic. No pain and some drainage. She had been placed on a course of Keflex with some improvement.  I personally reviewed the patient's history. HPI  Past Medical History  Diagnosis Date  . Diabetes mellitus ([facility])   . Hypertension   . Hypercholesterolemia   . High cholesterol     Past Surgical History  Procedure Laterality Date  . Tonsillectomy and adenoidectomy  1958  . Tubal ligation  1982    Family History  Problem Relation Age of Onset  . Breast cancer Mother   . Heart disease Father     myocardial infarction - died age 48  . Heart disease      paternal aunts and uncles  . Colon cancer Neg Hx   . Hypertension Mother     Social History Social History  Substance Use Topics  . Smoking status: Never Smoker   . Smokeless tobacco: Never Used  . Alcohol Use: No    Allergies  Allergen Reactions  . Zoloft [Sertraline Hcl] Rash    Current Outpatient Prescriptions  Medication Sig Dispense Refill  . lisinopril (PRINIVIL,ZESTRIL) 40 MG tablet TAKE ONE TABLET BY MOUTH ONCE DAILY 90 tablet 1  . metFORMIN (GLUCOPHAGE) 500 MG tablet TAKE TWO TABLETS BY MOUTH TWICE DAILY 120 tablet 0  . pravastatin (PRAVACHOL) 20 MG tablet TAKE TWO TABLETS BY MOUTH ONCE DAILY 60 tablet 0  . hydrochlorothiazide (MICROZIDE) 12.5 MG capsule TAKE ONE CAPSULE BY MOUTH ONCE DAILY (Patient not taking: Reported on 06/24/2015) 30 capsule 0  . HYDROcodone-acetaminophen (NORCO) 5-325 MG tablet Take 1-2 tablets by mouth every 6 (six) hours as needed for moderate pain. 30 tablet 0  . LORazepam (ATIVAN) 0.5 MG tablet Take 1/2 tablet hour prior  to procedure. Take remaining tablets to appointment. (Patient not taking: Reported on 06/24/2015) 2 tablet 0   No current facility-administered medications for this visit.    Review of Systems Review of Systems  Constitutional: Negative.   Respiratory: Negative.   Cardiovascular: Negative.     Blood pressure 174/84, pulse 94, resp. rate 14, height  (1.626 m), weight 163 lb (73.936 kg).  Physical Exam Physical Exam  Constitutional: She is oriented to person, place, and time. She appears well-developed and well-nourished.  Eyes: Conjunctivae are normal. No scleral icterus.  Neck: Neck supple.    Cardiovascular: Normal rate, regular rhythm and normal heart sounds.   Pulmonary/Chest: Effort normal and breath sounds normal.  Lymphadenopathy:    She has no cervical adenopathy.  Neurological: She is alert and oriented to person, place, and time.  Skin: Skin is warm and dry.       Assessment    Infected sebaceous cyst.    Plan    Excision/incision and drainage was recommended for management. The procedure was reviewed and the patient was amenable to proceed. 20 mL of 0.5% Xylocaine with 0.25% Marcaine with 1-200,000 of epinephrine was utilized well tolerated. ChloraPrep was applied to the skin. An elliptical incision was reused to excise the pustular skin. The cyst was ruptured and the entire cyst wall was  able to be extracted including the central pore. The area was rinsed with peroxide and then the skin closed with interrupted 4-0 proline vertical mattress sutures to obliterate dead space. A dry dressing with Telfa and Tegaderm was applied.  A prescription for Norco was provided for pain. She was encouraged to make use of ice packs intermittently today for comfort. The dressing can be removed in 24-48 hours. She was advised that there is likely to be some drainage for the first one-2 days.  The patient will complete the previous supplied Keflex prescription.      Patient to  return in one week nurse visit.  PCP:  [facility] This information has been scribed by Orvilla Fus CMA.    [name] 06/24/2015, 1:26 PM

## 2015-06-24 NOTE — Patient Instructions (Signed)
One week nurse.  

## 2015-07-01 ENCOUNTER — Ambulatory Visit (INDEPENDENT_AMBULATORY_CARE_PROVIDER_SITE_OTHER): Payer: BC Managed Care – PPO | Admitting: *Deleted

## 2015-07-01 DIAGNOSIS — L723 Sebaceous cyst: Secondary | ICD-10-CM

## 2015-07-01 NOTE — Progress Notes (Signed)
Patient came in today for a wound check/suture removal.  The wound is clean, with no signs of infection noted. Follow up as scheduled. Aware of pathology  

## 2015-07-01 NOTE — Patient Instructions (Signed)
The patient is aware to call back for any questions or concerns.  

## 2015-07-22 ENCOUNTER — Other Ambulatory Visit: Payer: Self-pay | Admitting: Internal Medicine

## 2015-07-30 ENCOUNTER — Other Ambulatory Visit: Payer: Self-pay

## 2015-07-30 MED ORDER — METFORMIN HCL 500 MG PO TABS: 1000.0000 mg | ORAL_TABLET | Freq: Two times a day (BID) | ORAL | Status: DC

## 2015-08-04 ENCOUNTER — Other Ambulatory Visit: Payer: BC Managed Care – PPO

## 2015-08-11 ENCOUNTER — Other Ambulatory Visit (INDEPENDENT_AMBULATORY_CARE_PROVIDER_SITE_OTHER): Payer: BC Managed Care – PPO

## 2015-08-11 DIAGNOSIS — K7689 Other specified diseases of liver: Secondary | ICD-10-CM | POA: Diagnosis not present

## 2015-08-11 DIAGNOSIS — I10 Essential (primary) hypertension: Secondary | ICD-10-CM | POA: Diagnosis not present

## 2015-08-11 DIAGNOSIS — E119 Type 2 diabetes mellitus without complications: Secondary | ICD-10-CM | POA: Diagnosis not present

## 2015-08-11 DIAGNOSIS — E7800 Pure hypercholesterolemia, unspecified: Secondary | ICD-10-CM | POA: Diagnosis not present

## 2015-08-11 DIAGNOSIS — R945 Abnormal results of liver function studies: Secondary | ICD-10-CM

## 2015-08-11 LAB — BASIC METABOLIC PANEL
BUN: 12 mg/dL (ref 6–23)
CHLORIDE: 103 meq/L (ref 96–112)
CO2: 31 meq/L (ref 19–32)
CREATININE: 0.82 mg/dL (ref 0.40–1.20)
Calcium: 9.6 mg/dL (ref 8.4–10.5)
GFR: 74.32 mL/min (ref >60.00)
GLUCOSE: 161 mg/dL — AB (ref 70–99)
Potassium: 4.8 mEq/L (ref 3.5–5.1)
Sodium: 141 mEq/L (ref 135–145)

## 2015-08-11 LAB — CBC WITH DIFFERENTIAL/PLATELET
Basophils Absolute: 0.1 10*3/uL (ref 0.0–0.1)
Basophils Relative: 0.7 % (ref 0.0–3.0)
EOS PCT: 0 % (ref 0.0–5.0)
Eosinophils Absolute: 0 10*3/uL (ref 0.0–0.7)
HCT: 39.5 % (ref 36.0–46.0)
Hemoglobin: 13.2 g/dL (ref 12.0–15.0)
LYMPHS ABS: 2.4 10*3/uL (ref 0.7–4.0)
Lymphocytes Relative: 32.6 % (ref 12.0–46.0)
MCHC: 33.5 g/dL (ref 30.0–36.0)
MCV: 86.2 fl (ref 78.0–100.0)
MONO ABS: 0.7 10*3/uL (ref 0.1–1.0)
Monocytes Relative: 9.7 % (ref 3.0–12.0)
NEUTROS PCT: 57 % (ref 43.0–77.0)
Neutro Abs: 4.2 10*3/uL (ref 1.4–7.7)
Platelets: 262 10*3/uL (ref 150.0–400.0)
RBC: 4.58 Mil/uL (ref 3.87–5.11)
RDW: 13.4 % (ref 11.5–15.5)
WBC: 7.4 10*3/uL (ref 4.0–10.5)

## 2015-08-11 LAB — HEPATIC FUNCTION PANEL
ALK PHOS: 88 U/L (ref 39–117)
ALT: 46 U/L — ABNORMAL HIGH (ref 0–35)
AST: 29 U/L (ref 0–37)
Albumin: 4.4 g/dL (ref 3.5–5.2)
BILIRUBIN DIRECT: 0.1 mg/dL (ref 0.0–0.3)
BILIRUBIN TOTAL: 0.6 mg/dL (ref 0.2–1.2)
Total Protein: 7 g/dL (ref 6.0–8.3)

## 2015-08-11 LAB — MICROALBUMIN / CREATININE URINE RATIO
Creatinine,U: 161.4 mg/dL
Microalb Creat Ratio: 3.2 mg/g (ref 0.0–30.0)
Microalb, Ur: 5.2 mg/dL — ABNORMAL HIGH (ref 0.0–1.9)

## 2015-08-11 LAB — LIPID PANEL
Cholesterol: 278 mg/dL — ABNORMAL HIGH (ref 0–200)
HDL: 41.5 mg/dL (ref >39.00)
LDL Cholesterol: 204 mg/dL — ABNORMAL HIGH (ref 0–99)
NONHDL: 236.76
Total CHOL/HDL Ratio: 7
Triglycerides: 163 mg/dL — ABNORMAL HIGH (ref 0.0–149.0)
VLDL: 32.6 mg/dL (ref 0.0–40.0)

## 2015-08-11 LAB — HEMOGLOBIN A1C: Hgb A1c MFr Bld: 9.2 % — ABNORMAL HIGH (ref 4.6–6.5)

## 2015-08-11 LAB — TSH: TSH: 5.49 u[IU]/mL — AB (ref 0.35–4.50)

## 2015-08-12 ENCOUNTER — Other Ambulatory Visit: Payer: Self-pay | Admitting: Internal Medicine

## 2015-08-12 DIAGNOSIS — E7800 Pure hypercholesterolemia, unspecified: Secondary | ICD-10-CM

## 2015-08-12 DIAGNOSIS — R7989 Other specified abnormal findings of blood chemistry: Secondary | ICD-10-CM

## 2015-08-12 NOTE — Progress Notes (Signed)
Order placed for f/u labs.  

## 2015-08-13 ENCOUNTER — Other Ambulatory Visit: Payer: Self-pay | Admitting: *Deleted

## 2015-08-13 MED ORDER — ROSUVASTATIN CALCIUM 10 MG PO TABS: 10.0000 mg | ORAL_TABLET | Freq: Every day | ORAL | Status: DC

## 2015-08-13 NOTE — Telephone Encounter (Signed)
Sent in new Rx for Crestor & D/C Pravastatin

## 2015-09-06 ENCOUNTER — Encounter: Payer: Self-pay | Admitting: Internal Medicine

## 2015-09-06 ENCOUNTER — Ambulatory Visit (INDEPENDENT_AMBULATORY_CARE_PROVIDER_SITE_OTHER): Payer: BC Managed Care – PPO | Admitting: Internal Medicine

## 2015-09-06 VITALS — BP 140/80 | HR 96 | Temp 98.4°F | Resp 18 | Ht 64.0 in | Wt 166.0 lb

## 2015-09-06 DIAGNOSIS — E7800 Pure hypercholesterolemia, unspecified: Secondary | ICD-10-CM | POA: Diagnosis not present

## 2015-09-06 DIAGNOSIS — R946 Abnormal results of thyroid function studies: Secondary | ICD-10-CM

## 2015-09-06 DIAGNOSIS — E119 Type 2 diabetes mellitus without complications: Secondary | ICD-10-CM | POA: Diagnosis not present

## 2015-09-06 DIAGNOSIS — R7989 Other specified abnormal findings of blood chemistry: Secondary | ICD-10-CM

## 2015-09-06 DIAGNOSIS — Z0000 Encounter for general adult medical examination without abnormal findings: Secondary | ICD-10-CM

## 2015-09-06 DIAGNOSIS — I10 Essential (primary) hypertension: Secondary | ICD-10-CM

## 2015-09-06 DIAGNOSIS — K7689 Other specified diseases of liver: Secondary | ICD-10-CM

## 2015-09-06 DIAGNOSIS — R945 Abnormal results of liver function studies: Secondary | ICD-10-CM

## 2015-09-06 DIAGNOSIS — R928 Other abnormal and inconclusive findings on diagnostic imaging of breast: Secondary | ICD-10-CM

## 2015-09-06 LAB — HEPATIC FUNCTION PANEL
ALBUMIN: 4.2 g/dL (ref 3.5–5.2)
ALK PHOS: 73 U/L (ref 39–117)
ALT: 41 U/L — ABNORMAL HIGH (ref 0–35)
AST: 30 U/L (ref 0–37)
BILIRUBIN TOTAL: 0.3 mg/dL (ref 0.2–1.2)
Bilirubin, Direct: 0.1 mg/dL (ref 0.0–0.3)
Total Protein: 6.7 g/dL (ref 6.0–8.3)

## 2015-09-06 LAB — TSH: TSH: 4.6 u[IU]/mL — AB (ref 0.35–4.50)

## 2015-09-06 NOTE — Patient Instructions (Signed)
cologuard - colon cancer screening

## 2015-09-06 NOTE — Progress Notes (Signed)
Patient ID: Gabrielle Yu, female   DOB: 1951-02-12, 65 y.o.   MRN: [id number]   Subjective:    Patient ID: Gabrielle Yu, female    DOB: June 20, 1950, 65 y.o.   MRN: [id number]  HPI  Patient here for her physical exam.  She has been back on her metformin for two weeks.  Brought in no recorded sugar readings.  States am sugars averaging 140 and PM sugars (when checked) 120.  No chest pain.  No sob.  No acid reflux.  No abdominal pain or cramping.  Bowels stable.  Discussed colonoscopy.  She declines.  Discussed cologuard.  She wants to check with her insurance.  Discussed the need for mammogram.  She will schedule.     Past Medical History  Diagnosis Date  . Diabetes mellitus ([place])   . Hypertension   . Hypercholesterolemia   . High cholesterol    Past Surgical History  Procedure Laterality Date  . Tonsillectomy and adenoidectomy  1958  . Tubal ligation  1982   Family History  Problem Relation Age of Onset  . Breast cancer Mother   . Heart disease Father     myocardial infarction - died age 23  . Heart disease      paternal aunts and uncles  . Colon cancer Neg Hx   . Hypertension Mother    Social History   Social History  . Marital Status: Married    Spouse Name: N/A  . Number of Children: 2  . Years of Education: N/A   Occupational History  .     Social History Main Topics  . Smoking status: Never Smoker   . Smokeless tobacco: Never Used  . Alcohol Use: No  . Drug Use: No  . Sexual Activity: Not Asked   Other Topics Concern  . None   Social History Narrative    Outpatient Encounter Prescriptions as of 09/06/2015  Medication Sig  . lisinopril (PRINIVIL,ZESTRIL) 40 MG tablet TAKE ONE TABLET BY MOUTH ONCE DAILY  . metFORMIN (GLUCOPHAGE) 500 MG tablet Take 2 tablets (1,000 mg total) by mouth 2 (two) times daily.  . rosuvastatin (CRESTOR) 10 MG tablet Take 1 tablet (10 mg total) by mouth daily.  . [DISCONTINUED] hydrochlorothiazide (MICROZIDE) 12.5 MG  capsule TAKE ONE CAPSULE BY MOUTH ONCE DAILY (Patient not taking: Reported on 06/24/2015)  . [DISCONTINUED] HYDROcodone-acetaminophen (NORCO) 5-325 MG tablet Take 1-2 tablets by mouth every 6 (six) hours as needed for moderate pain.  . [DISCONTINUED] LORazepam (ATIVAN) 0.5 MG tablet Take 1/2 tablet hour prior to procedure. Take remaining tablets to appointment. (Patient not taking: Reported on 06/24/2015)   No facility-administered encounter medications on file as of 09/06/2015.    Review of Systems  Constitutional: Negative for appetite change and unexpected weight change.  HENT: Negative for congestion and sinus pressure.   Eyes: Negative for pain and visual disturbance.  Respiratory: Negative for cough, chest tightness and shortness of breath.   Cardiovascular: Negative for chest pain, palpitations and leg swelling.  Gastrointestinal: Negative for nausea, vomiting, abdominal pain and diarrhea.  Genitourinary: Negative for dysuria and difficulty urinating.  Musculoskeletal: Negative for back pain and joint swelling.  Skin: Negative for color change and rash.  Neurological: Negative for dizziness, light-headedness and headaches.  Hematological: Negative for adenopathy. Does not bruise/bleed easily.  Psychiatric/Behavioral: Negative for dysphoric mood and agitation.       Objective:    Physical Exam  Constitutional: She is oriented to person, place, and time. She appears  well-developed and well-nourished. No distress.  HENT:  Nose: Nose normal.  Mouth/Throat: Oropharynx is clear and moist.  Eyes: Right eye exhibits no discharge. Left eye exhibits no discharge. No scleral icterus.  Neck: Neck supple. No thyromegaly present.  Cardiovascular: Normal rate and regular rhythm.   Pulmonary/Chest: Breath sounds normal. No accessory muscle usage. No tachypnea. No respiratory distress. She has no decreased breath sounds. She has no wheezes. She has no rhonchi. Right breast exhibits no inverted  nipple, no mass, no nipple discharge and no tenderness (no axillary adenopathy). Left breast exhibits no inverted nipple, no mass, no nipple discharge and no tenderness (no axilarry adenopathy).  Abdominal: Soft. Bowel sounds are normal. There is no tenderness.  Genitourinary:  She declines.   Musculoskeletal: She exhibits no edema or tenderness.  Lymphadenopathy:    She has no cervical adenopathy.  Neurological: She is alert and oriented to person, place, and time.  Skin: Skin is warm. No rash noted. No erythema.  Psychiatric: She has a normal mood and affect. Her behavior is normal.    BP 140/80 mmHg  Pulse 96  Temp(Src) 98.4 F (36.9 C) (Oral)  Resp 18  Ht _0  (1.626 m)  Wt 166 lb (75.297 kg)  BMI 28.48 kg/m2  SpO2 98% Wt Readings from Last 3 Encounters:  09/06/15 166 lb (75.297 kg)  06/24/15 163 lb (73.936 kg)  06/16/15 162 lb 4 oz (73.596 kg)     Lab Results  Component Value Date   WBC 7.4 08/11/2015   HGB 13.2 08/11/2015   HCT 39.5 08/11/2015   PLT 262.0 08/11/2015   GLUCOSE 161* 08/11/2015   CHOL 278* 08/11/2015   TRIG 163.0* 08/11/2015   HDL 41.50 08/11/2015   LDLDIRECT 157.1 01/21/2013   LDLCALC 204* 08/11/2015   ALT 41* 09/06/2015   AST 30 09/06/2015   NA 141 08/11/2015   K 4.8 08/11/2015   CL 103 08/11/2015   CREATININE 0.82 08/11/2015   BUN 12 08/11/2015   CO2 31 08/11/2015   TSH 4.60* 09/06/2015   HGBA1C 9.2* 08/11/2015   MICROALBUR 5.2* 08/11/2015        Assessment & Plan:   Problem List Items Addressed This Visit    Abnormal liver function    Most recent liver panel stable.  ALT just slightly elevated.  Remainder of liver panel wnl.  Follow.        Abnormal mammogram    Previous abnormal mammogram.  Saw Dr Bary Castilla.  S/p biopsy - ok.  Discussed need for f/u mammogram.  States she will schedule.  Declines for [place] to schedule.        Diabetes mellitus ([address]) - Primary    Just restarted metformin.  Sugars as outlined.  Hold on making any  adjustments.  Continue low carb diet.  Discussed exercise.  Follow met b and a1c.        Health care maintenance    Physical 09/06/15.  Declined GU exam.  Tried to schedule mammogram.  See above.  Declines colonoscopy.  Discussed cologuard.  She will check with her insurance.        Hypercholesteremia    Cholesterol elevated.  Started crestor. Low cholesterol diet and exercise.  Follow lipid panel and liver function tests.        Hypertension    Blood pressure under good control.  Continue same medication regimen.  Follow pressures.  Follow metabolic panel.         Other Visit Diagnoses  Hypercholesterolemia        Elevated TSH            Einar Pheasant, MD

## 2015-09-06 NOTE — Progress Notes (Signed)
Pre-visit discussion using our clinic review tool. No additional management support is needed unless otherwise documented below in the visit note.  

## 2015-09-07 ENCOUNTER — Encounter: Payer: Self-pay | Admitting: *Deleted

## 2015-09-19 ENCOUNTER — Encounter: Payer: Self-pay | Admitting: Internal Medicine

## 2015-09-19 DIAGNOSIS — Z0000 Encounter for general adult medical examination without abnormal findings: Secondary | ICD-10-CM | POA: Insufficient documentation

## 2015-09-19 NOTE — Assessment & Plan Note (Signed)
Previous abnormal mammogram.  Saw Dr Lemar LivingsByrnett.  S/p biopsy - ok.  Discussed need for f/u mammogram.  States she will schedule.  Declines for us to schedule.

## 2015-09-19 NOTE — Assessment & Plan Note (Signed)
Cholesterol elevated.  Started crestor. Low cholesterol diet and exercise.  Follow lipid panel and liver function tests.

## 2015-09-19 NOTE — Assessment & Plan Note (Signed)
Blood pressure under good control.  Continue same medication regimen.  Follow pressures.  Follow metabolic panel.   

## 2015-09-19 NOTE — Assessment & Plan Note (Signed)
Most recent liver panel stable.  ALT just slightly elevated.  Remainder of liver panel wnl.  Follow.

## 2015-09-19 NOTE — Assessment & Plan Note (Signed)
Physical 09/06/15.  Declined GU exam.  Tried to schedule mammogram.  See above.  Declines colonoscopy.  Discussed cologuard.  She will check with her insurance.

## 2015-09-19 NOTE — Assessment & Plan Note (Signed)
Just restarted metformin.  Sugars as outlined.  Hold on making any adjustments.  Continue low carb diet.  Discussed exercise.  Follow met b and a1c.

## 2015-09-21 ENCOUNTER — Telehealth: Payer: Self-pay | Admitting: Internal Medicine

## 2015-09-21 NOTE — Telephone Encounter (Signed)
Lm on vm to schedule a non fasting lab in 4-6 wks from now around the end of June..Marland Kitchen

## 2015-11-10 ENCOUNTER — Other Ambulatory Visit: Payer: Self-pay | Admitting: Internal Medicine

## 2015-11-10 ENCOUNTER — Ambulatory Visit
Admission: RE | Admit: 2015-11-10 | Discharge: 2015-11-10 | Disposition: A | Discharge status: Home / Self Care | Payer: BC Managed Care – PPO | Source: Ambulatory Visit | Attending: Internal Medicine | Admitting: Internal Medicine

## 2015-11-10 DIAGNOSIS — Z1239 Encounter for other screening for malignant neoplasm of breast: Secondary | ICD-10-CM

## 2015-11-10 DIAGNOSIS — Z1231 Encounter for screening mammogram for malignant neoplasm of breast: Secondary | ICD-10-CM | POA: Insufficient documentation

## 2015-12-06 ENCOUNTER — Telehealth: Payer: Self-pay | Admitting: Internal Medicine

## 2015-12-06 ENCOUNTER — Telehealth: Payer: Self-pay

## 2015-12-06 DIAGNOSIS — E7800 Pure hypercholesterolemia, unspecified: Secondary | ICD-10-CM

## 2015-12-06 DIAGNOSIS — R945 Abnormal results of liver function studies: Secondary | ICD-10-CM

## 2015-12-06 DIAGNOSIS — E119 Type 2 diabetes mellitus without complications: Secondary | ICD-10-CM

## 2015-12-06 DIAGNOSIS — R7989 Other specified abnormal findings of blood chemistry: Secondary | ICD-10-CM

## 2015-12-06 NOTE — Telephone Encounter (Signed)
Ms. Gabrielle Yu called saying her work schedule was changed this week at the last minute and she had to cancel her lab and office visit appointments with Dr. Lorin PicketScott. She's wondering if she can be fit in sometime in September to see Dr. Lorin PicketScott. I informed her Dr. Roby LoftsScott's schedule is booked even through sometime in September/October. She'd like a phone call regarding this.  Pt's ph# [phone] Thank you.

## 2015-12-06 NOTE — Telephone Encounter (Signed)
Pt coming for fasting labs 12/07/15. Please place future orders. Thank you. 

## 2015-12-07 ENCOUNTER — Other Ambulatory Visit: Payer: BC Managed Care – PPO

## 2015-12-07 ENCOUNTER — Ambulatory Visit: Payer: BC Managed Care – PPO | Admitting: Internal Medicine

## 2015-12-07 NOTE — Telephone Encounter (Signed)
Orders placed for labs

## 2015-12-07 NOTE — Telephone Encounter (Signed)
Lm on vm for pt to call back and reschedule appt

## 2015-12-09 ENCOUNTER — Ambulatory Visit: Payer: BC Managed Care – PPO | Admitting: Internal Medicine

## 2015-12-09 NOTE — Telephone Encounter (Signed)
Lm on vm to reschedule her appt that she had to cancel due to her work schedule

## 2016-11-17 ENCOUNTER — Other Ambulatory Visit (HOSPITAL_COMMUNITY)
Admission: RE | Admit: 2016-11-17 | Discharge: 2016-11-17 | Disposition: A | Discharge status: Home / Self Care | Payer: BC Managed Care – PPO | Source: Ambulatory Visit | Attending: Internal Medicine | Admitting: Internal Medicine

## 2016-11-17 ENCOUNTER — Encounter: Payer: Self-pay | Admitting: Internal Medicine

## 2016-11-17 ENCOUNTER — Ambulatory Visit (INDEPENDENT_AMBULATORY_CARE_PROVIDER_SITE_OTHER): Payer: BC Managed Care – PPO | Admitting: Internal Medicine

## 2016-11-17 VITALS — BP 140/88 | HR 100 | Temp 99.2°F | Resp 12 | Ht 64.0 in | Wt 152.4 lb

## 2016-11-17 DIAGNOSIS — Z1231 Encounter for screening mammogram for malignant neoplasm of breast: Secondary | ICD-10-CM

## 2016-11-17 DIAGNOSIS — N949 Unspecified condition associated with female genital organs and menstrual cycle: Secondary | ICD-10-CM | POA: Insufficient documentation

## 2016-11-17 DIAGNOSIS — N760 Acute vaginitis: Secondary | ICD-10-CM

## 2016-11-17 DIAGNOSIS — E119 Type 2 diabetes mellitus without complications: Secondary | ICD-10-CM | POA: Insufficient documentation

## 2016-11-17 DIAGNOSIS — K7689 Other specified diseases of liver: Secondary | ICD-10-CM | POA: Insufficient documentation

## 2016-11-17 DIAGNOSIS — Z124 Encounter for screening for malignant neoplasm of cervix: Secondary | ICD-10-CM | POA: Diagnosis not present

## 2016-11-17 DIAGNOSIS — I10 Essential (primary) hypertension: Secondary | ICD-10-CM | POA: Insufficient documentation

## 2016-11-17 DIAGNOSIS — Z1239 Encounter for other screening for malignant neoplasm of breast: Secondary | ICD-10-CM

## 2016-11-17 DIAGNOSIS — R945 Abnormal results of liver function studies: Secondary | ICD-10-CM

## 2016-11-17 DIAGNOSIS — Z0001 Encounter for general adult medical examination with abnormal findings: Secondary | ICD-10-CM | POA: Diagnosis not present

## 2016-11-17 DIAGNOSIS — E7800 Pure hypercholesterolemia, unspecified: Secondary | ICD-10-CM | POA: Diagnosis not present

## 2016-11-17 DIAGNOSIS — Z0000 Encounter for general adult medical examination without abnormal findings: Secondary | ICD-10-CM

## 2016-11-17 LAB — CBC WITH DIFFERENTIAL/PLATELET
BASOS ABS: 84 {cells}/uL (ref 0–200)
Basophils Relative: 1 %
EOS PCT: 0 %
Eosinophils Absolute: 0 cells/uL — ABNORMAL LOW (ref 15–500)
HCT: 41.4 % (ref 35.0–45.0)
HEMOGLOBIN: 13.6 g/dL (ref 11.7–15.5)
LYMPHS ABS: 2268 {cells}/uL (ref 850–3900)
Lymphocytes Relative: 27 %
MCH: 29.4 pg (ref 27.0–33.0)
MCHC: 32.9 g/dL (ref 32.0–36.0)
MCV: 89.6 fL (ref 80.0–100.0)
MPV: 11.3 fL (ref 7.5–12.5)
Monocytes Absolute: 588 cells/uL (ref 200–950)
Monocytes Relative: 7 %
NEUTROS ABS: 5460 {cells}/uL (ref 1500–7800)
Neutrophils Relative %: 65 %
Platelets: 294 10*3/uL (ref 140–400)
RBC: 4.62 MIL/uL (ref 3.80–5.10)
RDW: 13 % (ref 11.0–15.0)
WBC: 8.4 10*3/uL (ref 3.8–10.8)

## 2016-11-17 LAB — TSH: TSH: 3.33 m[IU]/L

## 2016-11-17 MED ORDER — LISINOPRIL 40 MG PO TABS: 40.0000 mg | ORAL_TABLET | Freq: Every day | ORAL | 1 refills | Status: DC

## 2016-11-17 MED ORDER — NYSTATIN 100000 UNIT/GM EX CREA: 1.0000 "application " | TOPICAL_CREAM | Freq: Two times a day (BID) | CUTANEOUS | 0 refills | Status: DC

## 2016-11-17 MED ORDER — METFORMIN HCL 500 MG PO TABS: 1000.0000 mg | ORAL_TABLET | Freq: Two times a day (BID) | ORAL | 1 refills | Status: DC

## 2016-11-17 MED ORDER — ROSUVASTATIN CALCIUM 10 MG PO TABS: 10.0000 mg | ORAL_TABLET | Freq: Every day | ORAL | 1 refills | Status: DC

## 2016-11-17 NOTE — Assessment & Plan Note (Addendum)
Physical 11/17/16.   Schedule mammogram.  Last mammogram 10/2015 - Birads I.  Discussed colonoscopy.  She declines.  Did not check on cologuard.  Wrote down for her today to check with her insurance as requested.

## 2016-11-17 NOTE — Progress Notes (Signed)
Patient ID: Gabrielle Yu, female   DOB: 05/19/1950, 66 y.o.   MRN: [id number]   Subjective:    Patient ID: Gabrielle Yu, female    DOB: 10-16-1950, 66 y.o.   MRN: [id number]  HPI  Patient here for her physical exam.  I have not seen her since 08/2015.  She is off all her medication.  Not taking anything.  Weight loss.  Not checking her sugars.  Increased stress with her husband's health issues.  Does not feel she needs anything more at this time.  No headache.  No chest pain.  No sob.  No acid reflux.  No abdominal pain or cramping.  Bowels stable.  Drinking more water.  Does report vaginal odor and some irritation on the outside of her vagina.  Some dryness.  No urinary issues.     Past Medical History:  Diagnosis Date  . Diabetes mellitus ([place])   . High cholesterol   . Hypercholesterolemia   . Hypertension    Past Surgical History:  Procedure Laterality Date  . BREAST BIOPSY Left 2014   core with clip  . TONSILLECTOMY AND ADENOIDECTOMY  1958  . TUBAL LIGATION  1982   Family History  Problem Relation Age of Onset  . Breast cancer Mother 85  . Hypertension Mother   . Heart disease Father        myocardial infarction - died age 66  . Heart disease Unknown        paternal aunts and uncles  . Colon cancer Neg Hx    Social History   Social History  . Marital status: Married    Spouse name: N/A  . Number of children: 2  . Years of education: N/A   Occupational History  .  [employer] History Main Topics  . Smoking status: Never Smoker  . Smokeless tobacco: Never Used  . Alcohol use No  . Drug use: No  . Sexual activity: Not Asked   Other Topics Concern  . None   Social History Narrative  . None    Outpatient Encounter Prescriptions as of 11/17/2016  Medication Sig  . lisinopril (PRINIVIL,ZESTRIL) 40 MG tablet Take 1 tablet (40 mg total) by mouth daily.  . metFORMIN (GLUCOPHAGE) 500 MG tablet Take 2 tablets (1,000 mg  total) by mouth 2 (two) times daily.  Marland Kitchen nystatin cream (MYCOSTATIN) Apply 1 application topically 2 (two) times daily.  . rosuvastatin (CRESTOR) 10 MG tablet Take 1 tablet (10 mg total) by mouth daily.  . [DISCONTINUED] lisinopril (PRINIVIL,ZESTRIL) 40 MG tablet TAKE ONE TABLET BY MOUTH ONCE DAILY (Patient not taking: Reported on 11/17/2016)  . [DISCONTINUED] metFORMIN (GLUCOPHAGE) 500 MG tablet Take 2 tablets (1,000 mg total) by mouth 2 (two) times daily. (Patient not taking: Reported on 11/17/2016)  . [DISCONTINUED] rosuvastatin (CRESTOR) 10 MG tablet Take 1 tablet (10 mg total) by mouth daily. (Patient not taking: Reported on 11/17/2016)   No facility-administered encounter medications on file as of 11/17/2016.     Review of Systems  Constitutional:       Weight loss.  Appetite is better.  Weight loss.    HENT: Negative for congestion and sinus pressure.   Eyes: Negative for pain and visual disturbance.  Respiratory: Negative for cough, chest tightness and shortness of breath.   Cardiovascular: Negative for chest pain, palpitations and leg swelling.  Gastrointestinal: Negative for abdominal pain, diarrhea, nausea and vomiting.  Genitourinary: Negative for difficulty urinating and dysuria.  Musculoskeletal: Negative for back pain and joint swelling.  Skin: Negative for color change and rash.  Neurological: Negative for dizziness, light-headedness and headaches.  Hematological: Negative for adenopathy. Does not bruise/bleed easily.  Psychiatric/Behavioral: Negative for agitation and dysphoric mood.       Increased stress as outlined.         Objective:    Physical Exam  Constitutional: She is oriented to person, place, and time. She appears well-developed and well-nourished. No distress.  HENT:  Nose: Nose normal.  Mouth/Throat: Oropharynx is clear and moist.  Eyes: Right eye exhibits no discharge. Left eye exhibits no discharge. No scleral icterus.  Neck: Neck supple. No thyromegaly  present.  Cardiovascular: Normal rate and regular rhythm.   Pulmonary/Chest: Breath sounds normal. No accessory muscle usage. No tachypnea. No respiratory distress. She has no decreased breath sounds. She has no wheezes. She has no rhonchi. Right breast exhibits no inverted nipple, no mass, no nipple discharge and no tenderness (no axillary adenopathy). Left breast exhibits no inverted nipple, no mass, no nipple discharge and no tenderness (no axilarry adenopathy).  Abdominal: Soft. Bowel sounds are normal. There is no tenderness.  Genitourinary:  Genitourinary Comments: Normal external genitalia.  Vaginal vault without lesions.  Cervix identified.  Pap smear performed.  Could not appreciate any adnexal masses or tenderness.  Some minimal erythema and white changes - outer labia.    Musculoskeletal: She exhibits no edema or tenderness.  Lymphadenopathy:    She has no cervical adenopathy.  Neurological: She is alert and oriented to person, place, and time.  Skin: Skin is warm. No rash noted. No erythema.  Psychiatric: She has a normal mood and affect. Her behavior is normal.    BP 140/88 (BP Location: Left Arm, Patient Position: Sitting, Cuff Size: Normal)   Pulse 100   Temp 99.2 F (37.3 C) (Oral)   Resp 12   Ht _0  (1.626 m)   Wt 152 lb 6.4 oz (69.1 kg)   SpO2 96%   BMI 26.16 kg/m  Wt Readings from Last 3 Encounters:  11/17/16 152 lb 6.4 oz (69.1 kg)  09/06/15 166 lb (75.3 kg)  06/24/15 163 lb (73.9 kg)     Lab Results  Component Value Date   WBC 8.4 11/17/2016   HGB 13.6 11/17/2016   HCT 41.4 11/17/2016   PLT 294 11/17/2016   GLUCOSE 402 (H) 11/17/2016   CHOL 278 (H) 08/11/2015   TRIG 163.0 (H) 08/11/2015   HDL 41.50 08/11/2015   LDLDIRECT 157.1 01/21/2013   LDLCALC 204 (H) 08/11/2015   ALT 31 (H) 11/17/2016   AST 23 11/17/2016   NA 138 11/17/2016   K 4.4 11/17/2016   CL 99 11/17/2016   CREATININE 1.21 (H) 11/17/2016   BUN 12 11/17/2016   CO2 24 11/17/2016   TSH  3.33 11/17/2016   HGBA1C 13.8 (H) 11/17/2016   MICROALBUR 2.5 11/17/2016    Mm Screening Breast Tomo Bilateral  Result Date: 11/11/2015 CLINICAL DATA:  Screening. EXAM: 2D DIGITAL SCREENING BILATERAL MAMMOGRAM WITH CAD AND ADJUNCT TOMO COMPARISON:  Previous exam(s). ACR Breast Density Category c: The breast tissue is heterogeneously dense, which may obscure small masses. FINDINGS: There are no findings suspicious for malignancy. Images were processed with CAD. IMPRESSION: No mammographic evidence of malignancy. A result letter of this screening mammogram will be mailed directly to the patient. RECOMMENDATION: Screening mammogram in one year. (Code:SM-B-01Y) BI-RADS CATEGORY  1: Negative. Electronically Signed   By: Polly Cobia.D.  On: 11/11/2015 07:32       Assessment & Plan:   Problem List Items Addressed This Visit    Abnormal liver function    Low carb diet and exercise.  Follow liver panel.        Relevant Orders   Hepatic function panel (Completed)   Diabetes mellitus ([place])    Has not been checking sugars.  Not taking medication.  Not watching her diet.  Discussed low carb diet and exercises.  Follow met b and a1c.  Check urine microalbumin/creatinine ratio.  Needs eye exam.       Relevant Medications   lisinopril (PRINIVIL,ZESTRIL) 40 MG tablet   metFORMIN (GLUCOPHAGE) 500 MG tablet   rosuvastatin (CRESTOR) 10 MG tablet   Other Relevant Orders   Hemoglobin A1c (Completed)   Microalbumin / creatinine urine ratio (Completed)   Health care maintenance    Physical 11/17/16.   Schedule mammogram.  Last mammogram 10/2015 - Birads I.  Discussed colonoscopy.  She declines.  Did not check on cologuard.  Wrote down for her today to check with her insurance as requested.        Hypercholesteremia    Restart crestor.  Low cholesterol diet and exercise.  Follow lipid panel and liver function tests.        Relevant Medications   lisinopril (PRINIVIL,ZESTRIL) 40 MG tablet    rosuvastatin (CRESTOR) 10 MG tablet   Hypertension    Blood pressure elevated.  Restart lisinopril.  Follow pressures.  Follow metabolic panel.       Relevant Medications   lisinopril (PRINIVIL,ZESTRIL) 40 MG tablet   rosuvastatin (CRESTOR) 10 MG tablet   Other Relevant Orders   TSH (Completed)   CBC with Differential/Platelet (Completed)   Basic metabolic panel (Completed)    Other Visit Diagnoses    Screening for breast cancer    -  Primary   Relevant Orders   MM DIGITAL SCREENING BILATERAL   Cytology - PAP   Screening for cervical cancer       Vaginal discomfort       Relevant Orders   Cervicovaginal ancillary only   Acute vaginitis       Vaginal irritation and discharge as outlined.  KOH/wet prep obtained.  Nystatin cream as directed.         Einar Pheasant, MD

## 2016-11-17 NOTE — Progress Notes (Signed)
Pre-visit discussion using our clinic review tool. No additional management support is needed unless otherwise documented below in the visit note.  

## 2016-11-18 LAB — HEMOGLOBIN A1C
Hgb A1c MFr Bld: 13.8 % — ABNORMAL HIGH (ref <5.7)
Mean Plasma Glucose: 349 mg/dL

## 2016-11-18 LAB — BASIC METABOLIC PANEL
BUN: 12 mg/dL (ref 7–25)
CHLORIDE: 99 mmol/L (ref 98–110)
CO2: 24 mmol/L (ref 20–31)
Calcium: 9 mg/dL (ref 8.6–10.4)
Creat: 1.21 mg/dL — ABNORMAL HIGH (ref 0.50–0.99)
GLUCOSE: 402 mg/dL — AB (ref 65–99)
POTASSIUM: 4.4 mmol/L (ref 3.5–5.3)
Sodium: 138 mmol/L (ref 135–146)

## 2016-11-18 LAB — MICROALBUMIN / CREATININE URINE RATIO
Creatinine, Urine: 79 mg/dL (ref 20–320)
Microalb Creat Ratio: 32 mcg/mg creat — ABNORMAL HIGH (ref <30)
Microalb, Ur: 2.5 mg/dL

## 2016-11-18 LAB — HEPATIC FUNCTION PANEL
ALT: 31 U/L — ABNORMAL HIGH (ref 6–29)
AST: 23 U/L (ref 10–35)
Albumin: 4.1 g/dL (ref 3.6–5.1)
Alkaline Phosphatase: 122 U/L (ref 33–130)
BILIRUBIN DIRECT: 0.1 mg/dL (ref <=0.2)
BILIRUBIN TOTAL: 0.4 mg/dL (ref 0.2–1.2)
Indirect Bilirubin: 0.3 mg/dL (ref 0.2–1.2)
Total Protein: 6.6 g/dL (ref 6.1–8.1)

## 2016-11-19 ENCOUNTER — Encounter: Payer: Self-pay | Admitting: Internal Medicine

## 2016-11-19 NOTE — Assessment & Plan Note (Signed)
Has not been checking sugars.  Not taking medication.  Not watching her diet.  Discussed low carb diet and exercises.  Follow met b and a1c.  Check urine microalbumin/creatinine ratio.  Needs eye exam.

## 2016-11-19 NOTE — Assessment & Plan Note (Signed)
Restart crestor.  Low cholesterol diet and exercise.  Follow lipid panel and liver function tests.   

## 2016-11-19 NOTE — Assessment & Plan Note (Signed)
Blood pressure elevated.  Restart lisinopril.  Follow pressures.  Follow metabolic panel.

## 2016-11-19 NOTE — Assessment & Plan Note (Signed)
Low carb diet and exercise.  Follow liver panel.   

## 2016-11-20 ENCOUNTER — Telehealth: Payer: Self-pay

## 2016-11-20 ENCOUNTER — Other Ambulatory Visit: Payer: Self-pay | Admitting: Internal Medicine

## 2016-11-20 ENCOUNTER — Telehealth: Payer: Self-pay | Admitting: Internal Medicine

## 2016-11-20 DIAGNOSIS — E7800 Pure hypercholesterolemia, unspecified: Secondary | ICD-10-CM

## 2016-11-20 DIAGNOSIS — R7989 Other specified abnormal findings of blood chemistry: Secondary | ICD-10-CM

## 2016-11-20 NOTE — Telephone Encounter (Signed)
Called patients cell and left message for date and time of mammogram appointment and asked patient to call if any questions. [facility]30/16 arrive at 4:40

## 2016-11-20 NOTE — Progress Notes (Signed)
Orders placed for f/u labs.  

## 2016-11-20 NOTE — Telephone Encounter (Signed)
Pt called back returning your call. Please advise, thank you! ° °Call pt @ [phone]  °

## 2016-11-20 NOTE — Telephone Encounter (Signed)
See lab note.  

## 2016-11-21 LAB — CERVICOVAGINAL ANCILLARY ONLY: Wet Prep (BD Affirm): NEGATIVE

## 2016-11-21 LAB — CYTOLOGY - PAP
Diagnosis: NEGATIVE
HPV (WINDOPATH): NOT DETECTED

## 2016-11-21 NOTE — Telephone Encounter (Signed)
Patient informed no questions  

## 2016-11-21 NOTE — Telephone Encounter (Signed)
Pt called back returning your call. Please advise, thank you!  Call pt @ [phone]

## 2016-11-24 ENCOUNTER — Other Ambulatory Visit (INDEPENDENT_AMBULATORY_CARE_PROVIDER_SITE_OTHER): Payer: BC Managed Care – PPO

## 2016-11-24 DIAGNOSIS — R7989 Other specified abnormal findings of blood chemistry: Secondary | ICD-10-CM

## 2016-11-24 DIAGNOSIS — E7800 Pure hypercholesterolemia, unspecified: Secondary | ICD-10-CM

## 2016-11-24 LAB — BASIC METABOLIC PANEL
BUN: 14 mg/dL (ref 6–23)
CHLORIDE: 105 meq/L (ref 96–112)
CO2: 28 meq/L (ref 19–32)
CREATININE: 0.82 mg/dL (ref 0.40–1.20)
Calcium: 9.2 mg/dL (ref 8.4–10.5)
GFR: 74.03 mL/min (ref >60.00)
Glucose, Bld: 108 mg/dL — ABNORMAL HIGH (ref 70–99)
POTASSIUM: 3.8 meq/L (ref 3.5–5.1)
Sodium: 141 mEq/L (ref 135–145)

## 2016-11-24 LAB — LIPID PANEL
CHOL/HDL RATIO: 5
CHOLESTEROL: 176 mg/dL (ref 0–200)
HDL: 37.5 mg/dL — AB (ref >39.00)
LDL Cholesterol: 110 mg/dL — ABNORMAL HIGH (ref 0–99)
NonHDL: 138.24
TRIGLYCERIDES: 142 mg/dL (ref 0.0–149.0)
VLDL: 28.4 mg/dL (ref 0.0–40.0)

## 2016-11-30 ENCOUNTER — Ambulatory Visit
Admission: RE | Admit: 2016-11-30 | Discharge: 2016-11-30 | Disposition: A | Discharge status: Home / Self Care | Payer: BC Managed Care – PPO | Source: Ambulatory Visit | Attending: Internal Medicine | Admitting: Internal Medicine

## 2016-11-30 DIAGNOSIS — Z1231 Encounter for screening mammogram for malignant neoplasm of breast: Secondary | ICD-10-CM | POA: Insufficient documentation

## 2016-11-30 DIAGNOSIS — Z1239 Encounter for other screening for malignant neoplasm of breast: Secondary | ICD-10-CM

## 2016-12-11 LAB — HM DIABETES EYE EXAM

## 2017-01-17 ENCOUNTER — Encounter: Payer: Self-pay | Admitting: *Deleted

## 2017-01-18 NOTE — Discharge Instructions (Signed)

## 2017-01-24 ENCOUNTER — Ambulatory Visit
Admission: RE | Admit: 2017-01-24 | Discharge: 2017-01-24 | Disposition: A | Discharge status: Home / Self Care | Payer: BC Managed Care – PPO | Source: Ambulatory Visit | Attending: Ophthalmology | Admitting: Ophthalmology

## 2017-01-24 ENCOUNTER — Ambulatory Visit: Payer: BC Managed Care – PPO | Admitting: Anesthesiology

## 2017-01-24 ENCOUNTER — Encounter
Admission: RE | Disposition: A | Discharge status: Home / Self Care | Payer: Self-pay | Source: Ambulatory Visit | Attending: Ophthalmology

## 2017-01-24 DIAGNOSIS — Z888 Allergy status to other drugs, medicaments and biological substances status: Secondary | ICD-10-CM | POA: Diagnosis not present

## 2017-01-24 DIAGNOSIS — H2511 Age-related nuclear cataract, right eye: Secondary | ICD-10-CM | POA: Insufficient documentation

## 2017-01-24 DIAGNOSIS — E1136 Type 2 diabetes mellitus with diabetic cataract: Secondary | ICD-10-CM | POA: Insufficient documentation

## 2017-01-24 DIAGNOSIS — E7800 Pure hypercholesterolemia, unspecified: Secondary | ICD-10-CM | POA: Insufficient documentation

## 2017-01-24 DIAGNOSIS — Z79899 Other long term (current) drug therapy: Secondary | ICD-10-CM | POA: Diagnosis not present

## 2017-01-24 DIAGNOSIS — I10 Essential (primary) hypertension: Secondary | ICD-10-CM | POA: Insufficient documentation

## 2017-01-24 DIAGNOSIS — Z9849 Cataract extraction status, unspecified eye: Secondary | ICD-10-CM | POA: Diagnosis not present

## 2017-01-24 HISTORY — PX: CATARACT EXTRACTION W/PHACO: SHX586

## 2017-01-24 LAB — GLUCOSE, CAPILLARY
GLUCOSE-CAPILLARY: 128 mg/dL — AB (ref 65–99)
Glucose-Capillary: 113 mg/dL — ABNORMAL HIGH (ref 65–99)

## 2017-01-24 SURGERY — PHACOEMULSIFICATION, CATARACT, WITH IOL INSERTION
Anesthesia: Monitor Anesthesia Care | Laterality: Right | Wound class: Clean

## 2017-01-24 MED ORDER — MIDAZOLAM HCL 2 MG/2ML IJ SOLN
INTRAMUSCULAR | Status: DC | PRN
  Administered 2017-01-24: 2 mg via INTRAVENOUS

## 2017-01-24 MED ORDER — LACTATED RINGERS IV SOLN: INTRAVENOUS | Status: DC

## 2017-01-24 MED ORDER — ARMC OPHTHALMIC DILATING DROPS: 1.0000 "application " | OPHTHALMIC | Status: DC | PRN

## 2017-01-24 MED ORDER — NA HYALUR & NA CHOND-NA HYALUR 0.4-0.35 ML IO KIT
PACK | INTRAOCULAR | Status: DC | PRN
  Administered 2017-01-24: 1 mL via INTRAOCULAR

## 2017-01-24 MED ORDER — CEFUROXIME OPHTHALMIC INJECTION 1 MG/0.1 ML
INJECTION | OPHTHALMIC | Status: DC | PRN
  Administered 2017-01-24: 0.1 mL via INTRACAMERAL

## 2017-01-24 MED ORDER — ARMC OPHTHALMIC DILATING DROPS
1.0000 "application " | OPHTHALMIC | Status: DC | PRN
  Administered 2017-01-24 (×3): 1 via OPHTHALMIC

## 2017-01-24 MED ORDER — LIDOCAINE HCL (PF) 2 % IJ SOLN
INTRAOCULAR | Status: DC | PRN
  Administered 2017-01-24: 1 mL

## 2017-01-24 MED ORDER — BSS IO SOLN
INTRAOCULAR | Status: DC | PRN
  Administered 2017-01-24: 61 mL via OPHTHALMIC

## 2017-01-24 MED ORDER — MOXIFLOXACIN HCL 0.5 % OP SOLN: 1.0000 [drp] | OPHTHALMIC | Status: DC | PRN

## 2017-01-24 MED ORDER — MOXIFLOXACIN HCL 0.5 % OP SOLN
1.0000 [drp] | OPHTHALMIC | Status: DC | PRN
  Administered 2017-01-24 (×3): 1 [drp] via OPHTHALMIC

## 2017-01-24 MED ORDER — FENTANYL CITRATE (PF) 100 MCG/2ML IJ SOLN
INTRAMUSCULAR | Status: DC | PRN
  Administered 2017-01-24: 50 ug via INTRAVENOUS

## 2017-01-24 SURGICAL SUPPLY — 25 items
CANNULA ANT/CHMB 27GA (MISCELLANEOUS) ×3 IMPLANT
CARTRIDGE ABBOTT (MISCELLANEOUS) IMPLANT
GLOVE SURG LX 7.5 STRW (GLOVE) ×2
GLOVE SURG LX STRL 7.5 STRW (GLOVE) ×1 IMPLANT
GLOVE SURG TRIUMPH 8.0 PF LTX (GLOVE) ×3 IMPLANT
GOWN STRL REUS W/ TWL LRG LVL3 (GOWN DISPOSABLE) ×2 IMPLANT
GOWN STRL REUS W/TWL LRG LVL3 (GOWN DISPOSABLE) ×4
LENS IOL TECNIS ITEC 23.0 (Intraocular Lens) ×3 IMPLANT
MARKER SKIN DUAL TIP RULER LAB (MISCELLANEOUS) ×3 IMPLANT
NDL RETROBULBAR .5 NSTRL (NEEDLE) IMPLANT
NEEDLE FILTER BLUNT 18X 1/2SAF (NEEDLE) ×2
NEEDLE FILTER BLUNT 18X1 1/2 (NEEDLE) ×1 IMPLANT
PACK CATARACT BRASINGTON (MISCELLANEOUS) ×3 IMPLANT
PACK EYE AFTER SURG (MISCELLANEOUS) ×3 IMPLANT
PACK OPTHALMIC (MISCELLANEOUS) ×3 IMPLANT
RING MALYGIN 7.0 (MISCELLANEOUS) IMPLANT
SUT ETHILON 10-0 CS-B-6CS-B-6 (SUTURE)
SUT VICRYL  9 0 (SUTURE)
SUT VICRYL 9 0 (SUTURE) IMPLANT
SUTURE EHLN 10-0 CS-B-6CS-B-6 (SUTURE) IMPLANT
SYR 3ML LL SCALE MARK (SYRINGE) ×3 IMPLANT
SYR 5ML LL (SYRINGE) ×3 IMPLANT
SYR TB 1ML LUER SLIP (SYRINGE) ×3 IMPLANT
WATER STERILE IRR 250ML POUR (IV SOLUTION) ×3 IMPLANT
WIPE NON LINTING 3.25X3.25 (MISCELLANEOUS) ×3 IMPLANT

## 2017-01-24 NOTE — Transfer of Care (Signed)
Immediate Anesthesia Transfer of Care Note  Patient: Gabrielle Yu  Procedure(s) Performed: CATARACT EXTRACTION PHACO AND INTRAOCULAR LENS PLACEMENT (IOC) RIGHT DIABETIC COMPLICATED (Right )  Patient Location: PACU  Anesthesia Type: MAC  Level of Consciousness: awake, alert  and patient cooperative  Airway and Oxygen Therapy: Patient Spontanous Breathing and Patient connected to supplemental oxygen  Post-op Assessment: Post-op Vital signs reviewed, Patient's Cardiovascular Status Stable, Respiratory Function Stable, Patent Airway and No signs of Nausea or vomiting  Post-op Vital Signs: Reviewed and stable  Complications: No apparent anesthesia complications

## 2017-01-24 NOTE — Op Note (Signed)
LOCATION:  [facility]   PREOPERATIVE DIAGNOSIS:    Nuclear sclerotic cataract right eye. H25.11   POSTOPERATIVE DIAGNOSIS:  Nuclear sclerotic cataract right eye.     PROCEDURE:  Phacoemusification with posterior chamber intraocular lens placement of the right eye   LENS:   Implant Name Type Inv. Item Serial No. Manufacturer Lot No. LRB No. Used  LENS IOL DIOP 23.0 - [id number] Intraocular Lens LENS IOL DIOP 23.0 [id number]   Right 1        ULTRASOUND TIME: 19 % of 1 minutes, 14 seconds.  CDE 14.1   SURGEON:  Deirdre Evener, MD   ANESTHESIA:  Topical with tetracaine drops and 2% Xylocaine jelly, augmented with 1% preservative-free intracameral lidocaine.    COMPLICATIONS:  None.   DESCRIPTION OF PROCEDURE:  The patient was identified in the holding room and transported to the operating room and placed in the supine position under the operating microscope.  The right eye was identified as the operative eye and it was prepped and draped in the usual sterile ophthalmic fashion.   A 1 millimeter clear-corneal paracentesis was made at the 12:00 position.  0.5 ml of preservative-free 1% lidocaine was injected into the anterior chamber. The anterior chamber was filled with Viscoat viscoelastic.  A 2.4 millimeter keratome was used to make a near-clear corneal incision at the 9:00 position.  A curvilinear capsulorrhexis was made with a cystotome and capsulorrhexis forceps.  Balanced salt solution was used to hydrodissect and hydrodelineate the nucleus.   Phacoemulsification was then used in stop and chop fashion to remove the lens nucleus and epinucleus.  The remaining cortex was then removed using the irrigation and aspiration handpiece. Provisc was then placed into the capsular bag to distend it for lens placement.  A lens was then injected into the capsular bag.  The remaining viscoelastic was aspirated.   Wounds were hydrated with balanced salt solution.  The anterior  chamber was inflated to a physiologic pressure with balanced salt solution.  No wound leaks were noted. Cefuroxime 0.1 ml of a /ml solution was injected into the anterior chamber for a dose of 1 mg of intracameral antibiotic at the completion of the case.   Timolol and Brimonidine drops were applied to the eye.  The patient was taken to the recovery room in stable condition without complications of anesthesia or surgery.   Kaytelyn Glore 01/24/2017, 8:35 AM

## 2017-01-24 NOTE — Anesthesia Preprocedure Evaluation (Signed)
Anesthesia Evaluation  Patient identified by MRN, date of birth, ID band Patient awake    Reviewed: Allergy & Precautions, H&P , NPO status , Patient's Chart, lab work & pertinent test results, reviewed documented beta blocker date and time   Airway Mallampati: II  TM Distance: >3 FB Neck ROM: full    Dental no notable dental hx.    Pulmonary neg pulmonary ROS,    Pulmonary exam normal breath sounds clear to auscultation       Cardiovascular Exercise Tolerance: Good hypertension,  Rhythm:regular Rate:Normal     Neuro/Psych negative neurological ROS  negative psych ROS   GI/Hepatic negative GI ROS, Neg liver ROS,   Endo/Other  diabetes  Renal/GU negative Renal ROS  negative genitourinary   Musculoskeletal   Abdominal   Peds  Hematology negative hematology ROS (+)   Anesthesia Other Findings   Reproductive/Obstetrics negative OB ROS                             Anesthesia Physical Anesthesia Plan  ASA: II  Anesthesia Plan: MAC   Post-op Pain Management:    Induction:   PONV Risk Score and Plan:   Airway Management Planned:   Additional Equipment:   Intra-op Plan:   Post-operative Plan:   Informed Consent: I have reviewed the patients History and Physical, chart, labs and discussed the procedure including the risks, benefits and alternatives for the proposed anesthesia with the patient or authorized representative who has indicated his/her understanding and acceptance.   Dental Advisory Given  Plan Discussed with: CRNA  Anesthesia Plan Comments:         Anesthesia Quick Evaluation

## 2017-01-24 NOTE — H&P (Signed)
The History and Physical notes are on paper, have been signed, and are to be scanned. The patient remains stable and unchanged from the H&P.   Previous H&P reviewed, patient examined, and there are no changes.  Justene Jensen 01/24/2017 7:40 AM

## 2017-01-24 NOTE — Anesthesia Postprocedure Evaluation (Signed)
Anesthesia Post Note  Patient: Gabrielle Yu  Procedure(s) Performed: CATARACT EXTRACTION PHACO AND INTRAOCULAR LENS PLACEMENT (IOC) RIGHT DIABETIC COMPLICATED (Right )  Patient location during evaluation: PACU Anesthesia Type: MAC Level of consciousness: awake and alert Pain management: pain level controlled Vital Signs Assessment: post-procedure vital signs reviewed and stable Respiratory status: spontaneous breathing, nonlabored ventilation, respiratory function stable and patient connected to nasal cannula oxygen Cardiovascular status: stable and blood pressure returned to baseline Postop Assessment: no apparent nausea or vomiting Anesthetic complications: no    Alisa Graff

## 2017-01-24 NOTE — Anesthesia Procedure Notes (Signed)
Procedure Name: MAC Performed by: Darnette Lampron Pre-anesthesia Checklist: Patient identified, Emergency Drugs available, Suction available, Timeout performed and Patient being monitored Patient Re-evaluated:Patient Re-evaluated prior to inductionOxygen Delivery Method: Nasal cannula Placement Confirmation: positive ETCO2       

## 2017-01-25 ENCOUNTER — Ambulatory Visit: Payer: BC Managed Care – PPO | Admitting: Internal Medicine

## 2017-01-25 ENCOUNTER — Encounter: Payer: Self-pay | Admitting: Ophthalmology

## 2017-01-26 ENCOUNTER — Other Ambulatory Visit: Payer: Self-pay | Admitting: Internal Medicine

## 2017-02-09 ENCOUNTER — Ambulatory Visit (INDEPENDENT_AMBULATORY_CARE_PROVIDER_SITE_OTHER): Payer: BC Managed Care – PPO | Admitting: Internal Medicine

## 2017-02-09 ENCOUNTER — Encounter: Payer: Self-pay | Admitting: Internal Medicine

## 2017-02-09 DIAGNOSIS — I10 Essential (primary) hypertension: Secondary | ICD-10-CM

## 2017-02-09 DIAGNOSIS — K7689 Other specified diseases of liver: Secondary | ICD-10-CM

## 2017-02-09 DIAGNOSIS — E119 Type 2 diabetes mellitus without complications: Secondary | ICD-10-CM

## 2017-02-09 DIAGNOSIS — E7800 Pure hypercholesterolemia, unspecified: Secondary | ICD-10-CM

## 2017-02-09 DIAGNOSIS — Z23 Encounter for immunization: Secondary | ICD-10-CM

## 2017-02-09 DIAGNOSIS — R945 Abnormal results of liver function studies: Secondary | ICD-10-CM

## 2017-02-09 NOTE — Progress Notes (Signed)
Patient ID: Gabrielle Yu, female   DOB: November 07, 1950, 66 y.o.   MRN: [id number]   Subjective:    Patient ID: Gabrielle Yu, female    DOB: [birth year], 66 y.o.   MRN: [id number]  HPI  Patient here for a scheduled follow up.  I saw her for the first time in over one year - last visit.  She had stopped taking all of her medication.  Sugars out of control.  a1c significantly elevated.  She follows up today and states she is taking her medication now.  States she has also started watching her diet and has decreased tea.  Has also stopped drinking soft drinks.  States her sugars are now averaging 140s.  AM ranging 110-120 - per report.  She brought in no recorded sugar readings.  No chest pain.  No sob.  No acid reflux.  No abdominal pain.  Bowels moving.  States she feels better.  Stress is better.     Past Medical History:  Diagnosis Date  . Diabetes mellitus ([facility])   . High cholesterol   . Hypercholesterolemia   . Hypertension    Past Surgical History:  Procedure Laterality Date  . BREAST BIOPSY Left 2014   core with clip  . CATARACT EXTRACTION W/PHACO Right 01/24/2017   Procedure: CATARACT EXTRACTION PHACO AND INTRAOCULAR LENS PLACEMENT (IOC) RIGHT DIABETIC COMPLICATED;  Surgeon: Lockie Mola, MD;  Location: [facility];  Service: Ophthalmology;  Laterality: Right;  Diabetic - oral meds  . TONSILLECTOMY AND ADENOIDECTOMY  1958  . TUBAL LIGATION  1982   Family History  Problem Relation Age of Onset  . Breast cancer Mother 34  . Hypertension Mother   . Heart disease Father        myocardial infarction - died age 12  . Heart disease Unknown        paternal aunts and uncles  . Colon cancer Neg Hx    Social History   Social History  . Marital status: Married    Spouse name: N/A  . Number of children: 2  . Years of education: N/A   Occupational History  .  [employer]   Social History Main Topics  . Smoking status: Never Smoker  .  Smokeless tobacco: Never Used  . Alcohol use No  . Drug use: No  . Sexual activity: Not Asked   Other Topics Concern  . None   Social History Narrative  . None    Outpatient Encounter Prescriptions as of 02/09/2017  Medication Sig  . lisinopril (PRINIVIL,ZESTRIL) 40 MG tablet TAKE 1 TABLET BY MOUTH DAILY  . metFORMIN (GLUCOPHAGE) 500 MG tablet TAKE 2 TABLETS BY MOUTH TWICE DAILY  . rosuvastatin (CRESTOR) 10 MG tablet TAKE 1 TABLET BY MOUTH DAILY  . [DISCONTINUED] nystatin cream (MYCOSTATIN) Apply 1 application topically 2 (two) times daily.   No facility-administered encounter medications on file as of 02/09/2017.     Review of Systems  Constitutional: Negative for appetite change and unexpected weight change.  HENT: Negative for congestion and sinus pressure.   Respiratory: Negative for cough, chest tightness and shortness of breath.   Cardiovascular: Negative for chest pain, palpitations and leg swelling.  Gastrointestinal: Negative for abdominal pain, nausea and vomiting.  Genitourinary: Negative for difficulty urinating and dysuria.  Musculoskeletal: Negative for joint swelling and myalgias.  Skin: Negative for color change and rash.  Neurological: Negative for dizziness, light-headedness and headaches.  Psychiatric/Behavioral: Negative for agitation and dysphoric mood.  Objective:    Physical Exam  Constitutional: She appears well-developed and well-nourished. No distress.  HENT:  Nose: Nose normal.  Mouth/Throat: Oropharynx is clear and moist.  Neck: Neck supple. No thyromegaly present.  Cardiovascular: Normal rate and regular rhythm.   Pulmonary/Chest: Breath sounds normal. No respiratory distress. She has no wheezes.  Abdominal: Soft. Bowel sounds are normal. There is no tenderness.  Musculoskeletal: She exhibits no edema or tenderness.  FEET:  No lesions.  Intact to pinprick and light touch.  DP pulses palpable and equal bilaterally    Lymphadenopathy:      She has no cervical adenopathy.  Skin: No rash noted. No erythema.  Psychiatric: She has a normal mood and affect. Her behavior is normal.    BP 140/80 (BP Location: Left Arm, Patient Position: Sitting, Cuff Size: Normal)   Pulse 86   Temp 98.6 F (37 C) (Oral)   Resp 14   Ht 5\' 4"  (1.626 m)   Wt 154 lb 6.4 oz (70 kg)   SpO2 97%   BMI 26.50 kg/m  Wt Readings from Last 3 Encounters:  02/09/17 154 lb 6.4 oz (70 kg)  01/24/17 150 lb (68 kg)  11/17/16 152 lb 6.4 oz (69.1 kg)     Lab Results  Component Value Date   WBC 8.4 11/17/2016   HGB 13.6 11/17/2016   HCT 41.4 11/17/2016   PLT 294 11/17/2016   GLUCOSE 108 (H) 11/24/2016   CHOL 176 11/24/2016   TRIG 142.0 11/24/2016   HDL 37.50 (L) 11/24/2016   LDLDIRECT 157.1 01/21/2013   LDLCALC 110 (H) 11/24/2016   ALT 31 (H) 11/17/2016   AST 23 11/17/2016   NA 141 11/24/2016   K 3.8 11/24/2016   CL 105 11/24/2016   CREATININE 0.82 11/24/2016   BUN 14 11/24/2016   CO2 28 11/24/2016   TSH 3.33 11/17/2016   HGBA1C 13.8 (H) 11/17/2016   MICROALBUR 2.5 11/17/2016       Assessment & Plan:   Problem List Items Addressed This Visit    Abnormal liver function    Low carb diet and exercise.  Discussed exercise and weight loss.  Follow liver function tests.        Diabetes mellitus ([facility])    Discussed with her today.  Discussed her previous a1c check and poorly controlled diabetes.  Discussed risk of untreated poorly controlled diabetes.  She has adjusted her diet.  States her sugars have improved.  Discussed my desire for additional medication.  She declines.  Follow metb  and a1c.  Discussed the need for regular eye exams.        Relevant Orders   Hemoglobin A1c   Basic metabolic panel   Hypercholesteremia    On crestor.  Low cholesterol diet and exercise.  Follow lipid panel and liver function tests.        Relevant Orders   Hepatic function panel   Lipid panel   Hypertension    Blood pressure as outlined.   Discussed additional medication.  She declines.  Wants to work on diet and exercise.  Follow pressures.  Follow metabolic panel.         Other Visit Diagnoses    Encounter for immunization       Relevant Orders   Flu vaccine HIGH DOSE PF (Completed)       Dale DurhamSCOTT, Obal[place]wa Delatte, MD

## 2017-02-11 ENCOUNTER — Encounter: Payer: Self-pay | Admitting: Internal Medicine

## 2017-02-11 NOTE — Assessment & Plan Note (Signed)
On crestor.  Low cholesterol diet and exercise.  Follow lipid panel and liver function tests.   

## 2017-02-11 NOTE — Assessment & Plan Note (Signed)
Discussed with her today.  Discussed her previous a1c check and poorly controlled diabetes.  Discussed risk of untreated poorly controlled diabetes.  She has adjusted her diet.  States her sugars have improved.  Discussed my desire for additional medication.  She declines.  Follow metb  and a1c.  Discussed the need for regular eye exams.

## 2017-02-11 NOTE — Assessment & Plan Note (Signed)
Low carb diet and exercise.  Discussed exercise and weight loss.  Follow liver function tests.

## 2017-02-11 NOTE — Assessment & Plan Note (Signed)
Blood pressure as outlined.  Discussed additional medication.  She declines.  Wants to work on diet and exercise.  Follow pressures.  Follow metabolic panel.

## 2017-02-23 ENCOUNTER — Other Ambulatory Visit (INDEPENDENT_AMBULATORY_CARE_PROVIDER_SITE_OTHER): Payer: BC Managed Care – PPO

## 2017-02-23 DIAGNOSIS — E7800 Pure hypercholesterolemia, unspecified: Secondary | ICD-10-CM | POA: Diagnosis not present

## 2017-02-23 DIAGNOSIS — E119 Type 2 diabetes mellitus without complications: Secondary | ICD-10-CM

## 2017-02-23 LAB — LIPID PANEL
CHOLESTEROL: 136 mg/dL (ref 0–200)
HDL: 42.2 mg/dL (ref >39.00)
LDL Cholesterol: 81 mg/dL (ref 0–99)
NonHDL: 94.08
TRIGLYCERIDES: 67 mg/dL (ref 0.0–149.0)
Total CHOL/HDL Ratio: 3
VLDL: 13.4 mg/dL (ref 0.0–40.0)

## 2017-02-23 LAB — BASIC METABOLIC PANEL
BUN: 15 mg/dL (ref 6–23)
CHLORIDE: 105 meq/L (ref 96–112)
CO2: 29 meq/L (ref 19–32)
Calcium: 9.1 mg/dL (ref 8.4–10.5)
Creatinine, Ser: 0.81 mg/dL (ref 0.40–1.20)
GFR: 75.03 mL/min (ref >60.00)
GLUCOSE: 108 mg/dL — AB (ref 70–99)
POTASSIUM: 4.3 meq/L (ref 3.5–5.1)
Sodium: 141 mEq/L (ref 135–145)

## 2017-02-23 LAB — HEMOGLOBIN A1C: Hgb A1c MFr Bld: 7.2 % — ABNORMAL HIGH (ref 4.6–6.5)

## 2017-02-23 LAB — HEPATIC FUNCTION PANEL
ALBUMIN: 4.2 g/dL (ref 3.5–5.2)
ALK PHOS: 54 U/L (ref 39–117)
ALT: 22 U/L (ref 0–35)
AST: 18 U/L (ref 0–37)
Bilirubin, Direct: 0.1 mg/dL (ref 0.0–0.3)
TOTAL PROTEIN: 7 g/dL (ref 6.0–8.3)
Total Bilirubin: 0.4 mg/dL (ref 0.2–1.2)

## 2017-04-29 ENCOUNTER — Other Ambulatory Visit: Payer: Self-pay | Admitting: Internal Medicine

## 2017-05-15 ENCOUNTER — Encounter: Payer: Self-pay | Admitting: Internal Medicine

## 2017-05-15 ENCOUNTER — Ambulatory Visit (INDEPENDENT_AMBULATORY_CARE_PROVIDER_SITE_OTHER): Payer: BC Managed Care – PPO | Admitting: Internal Medicine

## 2017-05-15 DIAGNOSIS — I10 Essential (primary) hypertension: Secondary | ICD-10-CM

## 2017-05-15 DIAGNOSIS — K7689 Other specified diseases of liver: Secondary | ICD-10-CM

## 2017-05-15 DIAGNOSIS — E7800 Pure hypercholesterolemia, unspecified: Secondary | ICD-10-CM | POA: Diagnosis not present

## 2017-05-15 DIAGNOSIS — E119 Type 2 diabetes mellitus without complications: Secondary | ICD-10-CM | POA: Diagnosis not present

## 2017-05-15 DIAGNOSIS — R945 Abnormal results of liver function studies: Secondary | ICD-10-CM

## 2017-05-15 LAB — HM DIABETES FOOT EXAM

## 2017-05-15 MED ORDER — METFORMIN HCL 500 MG PO TABS: 1000.0000 mg | ORAL_TABLET | Freq: Two times a day (BID) | ORAL | 1 refills | Status: DC

## 2017-05-15 MED ORDER — AMLODIPINE BESYLATE 5 MG PO TABS: 5.0000 mg | ORAL_TABLET | Freq: Every day | ORAL | 2 refills | Status: DC

## 2017-05-15 NOTE — Progress Notes (Signed)
Patient ID: Gabrielle Yu, female   DOB: 1950-09-24, 67 y.o.   MRN: [id number]   Subjective:    Patient ID: Gabrielle Yu, female    DOB: 16-Mar-1951, 67 y.o.   MRN: [id number]  HPI  Patient here for a scheduled follow up.  She reports she is doing relatively well.  Plans to retire the end of 05/2017.  Sugars doing better.  States am sugars are averaging 120 and before lunch are in the 90s.  No problem with low sugars.  No chest pain.  No sob.  No acid reflux.  No abdominal pain.  Bowels moving.  Taking her medication.     Past Medical History:  Diagnosis Date  . Diabetes mellitus ([place])   . High cholesterol   . Hypercholesterolemia   . Hypertension    Past Surgical History:  Procedure Laterality Date  . BREAST BIOPSY Left 2014   core with clip  . CATARACT EXTRACTION W/PHACO Right 01/24/2017   Procedure: CATARACT EXTRACTION PHACO AND INTRAOCULAR LENS PLACEMENT ([place]) RIGHT DIABETIC COMPLICATED;  Surgeon: Leandrew Koyanagi, MD;  Location: [facility];  Service: Ophthalmology;  Laterality: Right;  Diabetic - oral meds  . TONSILLECTOMY AND ADENOIDECTOMY  1958  . TUBAL LIGATION  1982   Family History  Problem Relation Age of Onset  . Breast cancer Mother 76  . Hypertension Mother   . Heart disease Father        myocardial infarction - died age 18  . Heart disease Unknown        paternal aunts and uncles  . Colon cancer Neg Hx    Social History   Socioeconomic History  . Marital status: Married    Spouse name: None  . Number of children: 2  . Years of education: None  . Highest education level: None  Social Needs  . Financial resource strain: None  . Food insecurity - worry: None  . Food insecurity - inability: None  . Transportation needs - medical: None  . Transportation needs - non-medical: None  Occupational History    Employer: [employer]  Tobacco Use  . Smoking status: Never Smoker  . Smokeless tobacco: Never Used    Substance and Sexual Activity  . Alcohol use: No    Alcohol/week: 0.0 oz  . Drug use: No  . Sexual activity: None  Other Topics Concern  . None  Social History Narrative  . None    Outpatient Encounter Medications as of 05/15/2017  Medication Sig  . lisinopril (PRINIVIL,ZESTRIL) 40 MG tablet TAKE 1 TABLET BY MOUTH DAILY  . metFORMIN (GLUCOPHAGE) 500 MG tablet Take 2 tablets (1,000 mg total) by mouth 2 (two) times daily.  . rosuvastatin (CRESTOR) 10 MG tablet TAKE 1 TABLET BY MOUTH DAILY  . [DISCONTINUED] metFORMIN (GLUCOPHAGE) 500 MG tablet TAKE 2 TABLETS BY MOUTH TWICE DAILY  . amLODipine (NORVASC) 5 MG tablet Take 1 tablet (5 mg total) by mouth daily.   No facility-administered encounter medications on file as of 05/15/2017.     Review of Systems  Constitutional: Negative for appetite change and fever.  HENT: Negative for congestion and sinus pressure.   Respiratory: Negative for cough, chest tightness and shortness of breath.   Cardiovascular: Negative for chest pain, palpitations and leg swelling.  Gastrointestinal: Negative for abdominal pain, diarrhea and nausea.  Genitourinary: Negative for difficulty urinating and dysuria.  Musculoskeletal: Negative for joint swelling and myalgias.  Skin: Negative for color change and rash.  Neurological: Negative  for dizziness, light-headedness and headaches.  Psychiatric/Behavioral: Negative for agitation and dysphoric mood.       Objective:     Blood pressure rechecked by me:  158/88  Physical Exam  Constitutional: She appears well-developed and well-nourished. No distress.  HENT:  Nose: Nose normal.  Mouth/Throat: Oropharynx is clear and moist.  Neck: Neck supple. No thyromegaly present.  Cardiovascular: Normal rate and regular rhythm.  Pulmonary/Chest: Breath sounds normal. No respiratory distress. She has no wheezes.  Abdominal: Soft. Bowel sounds are normal. There is no tenderness.  Musculoskeletal: She exhibits no  edema or tenderness.  Feet:  Sensation intact to pin prick and light touch.  DP pulses palpable and equal bilaterally.  No lesions.    Lymphadenopathy:    She has no cervical adenopathy.  Skin: No rash noted. No erythema.  Psychiatric: She has a normal mood and affect. Her behavior is normal.    BP (!) 150/90 (BP Location: Left Arm, Patient Position: Sitting, Cuff Size: Large)   Pulse 78   Temp 98.4 F (36.9 C) (Oral)   Resp 18   Wt 159 lb 12.8 oz (72.5 kg)   SpO2 98%   BMI 27.43 kg/m  Wt Readings from Last 3 Encounters:  05/15/17 159 lb 12.8 oz (72.5 kg)  02/09/17 154 lb 6.4 oz (70 kg)  01/24/17 150 lb (68 kg)     Lab Results  Component Value Date   WBC 8.4 11/17/2016   HGB 13.6 11/17/2016   HCT 41.4 11/17/2016   PLT 294 11/17/2016   GLUCOSE 108 (H) 02/23/2017   CHOL 136 02/23/2017   TRIG 67.0 02/23/2017   HDL 42.20 02/23/2017   LDLDIRECT 157.1 01/21/2013   LDLCALC 81 02/23/2017   ALT 22 02/23/2017   AST 18 02/23/2017   NA 141 02/23/2017   K 4.3 02/23/2017   CL 105 02/23/2017   CREATININE 0.81 02/23/2017   BUN 15 02/23/2017   CO2 29 02/23/2017   TSH 3.33 11/17/2016   HGBA1C 7.2 (H) 02/23/2017   MICROALBUR 2.5 11/17/2016       Assessment & Plan:   Problem List Items Addressed This Visit    Abnormal liver function    Low carb diet and exercise.  Follow liver panel.       Diabetes mellitus ([place])    Low carb diet and exercise.  Sugars as outlined.  Overall doing better.  Follow met b and a1c.        Relevant Medications   metFORMIN (GLUCOPHAGE) 500 MG tablet   Other Relevant Orders   Hemoglobin A1c   Hypercholesteremia    On crestor.  Low cholesterol diet and exercise.  Follow lipid panel and liver function tests.   Lab Results  Component Value Date   CHOL 136 02/23/2017   HDL 42.20 02/23/2017   LDLCALC 81 02/23/2017   LDLDIRECT 157.1 01/21/2013   TRIG 67.0 02/23/2017   CHOLHDL 3 02/23/2017        Relevant Medications   amLODipine (NORVASC)  5 MG tablet   Other Relevant Orders   Lipid panel   Hepatic function panel   Hypertension    Blood pressure remains elevated.  Add amlodipine.  Follow pressures.  Get her back in soon to reassess.        Relevant Medications   amLODipine (NORVASC) 5 MG tablet   Other Relevant Orders   TSH   Basic metabolic panel       Einar Pheasant, MD

## 2017-05-18 ENCOUNTER — Encounter: Payer: Self-pay | Admitting: Internal Medicine

## 2017-05-18 NOTE — Assessment & Plan Note (Signed)
Blood pressure remains elevated.  Add amlodipine.  Follow pressures.  Get her back in soon to reassess.

## 2017-05-18 NOTE — Assessment & Plan Note (Signed)
On crestor.  Low cholesterol diet and exercise.  Follow lipid panel and liver function tests.   Lab Results  Component Value Date   CHOL 136 02/23/2017   HDL 42.20 02/23/2017   LDLCALC 81 02/23/2017   LDLDIRECT 157.1 01/21/2013   TRIG 67.0 02/23/2017   CHOLHDL 3 02/23/2017

## 2017-05-18 NOTE — Assessment & Plan Note (Signed)
Low carb diet and exercise.  Follow liver panel.   

## 2017-05-18 NOTE — Assessment & Plan Note (Signed)
Low carb diet and exercise.  Sugars as outlined.  Overall doing better.  Follow met b and a1c.

## 2017-06-22 ENCOUNTER — Other Ambulatory Visit (INDEPENDENT_AMBULATORY_CARE_PROVIDER_SITE_OTHER): Payer: BC Managed Care – PPO

## 2017-06-22 DIAGNOSIS — E119 Type 2 diabetes mellitus without complications: Secondary | ICD-10-CM

## 2017-06-22 DIAGNOSIS — I10 Essential (primary) hypertension: Secondary | ICD-10-CM

## 2017-06-22 DIAGNOSIS — E7800 Pure hypercholesterolemia, unspecified: Secondary | ICD-10-CM

## 2017-06-22 LAB — HEPATIC FUNCTION PANEL
ALBUMIN: 3.8 g/dL (ref 3.5–5.2)
ALK PHOS: 57 U/L (ref 39–117)
ALT: 34 U/L (ref 0–35)
AST: 20 U/L (ref 0–37)
Bilirubin, Direct: 0.1 mg/dL (ref 0.0–0.3)
TOTAL PROTEIN: 6.7 g/dL (ref 6.0–8.3)
Total Bilirubin: 0.4 mg/dL (ref 0.2–1.2)

## 2017-06-22 LAB — LIPID PANEL
Cholesterol: 133 mg/dL (ref 0–200)
HDL: 43.4 mg/dL (ref >39.00)
LDL Cholesterol: 70 mg/dL (ref 0–99)
NONHDL: 89.47
Total CHOL/HDL Ratio: 3
Triglycerides: 96 mg/dL (ref 0.0–149.0)
VLDL: 19.2 mg/dL (ref 0.0–40.0)

## 2017-06-22 LAB — BASIC METABOLIC PANEL
BUN: 14 mg/dL (ref 6–23)
CO2: 30 mEq/L (ref 19–32)
Calcium: 9.2 mg/dL (ref 8.4–10.5)
Chloride: 106 mEq/L (ref 96–112)
Creatinine, Ser: 0.82 mg/dL (ref 0.40–1.20)
GFR: 73.9 mL/min (ref >60.00)
GLUCOSE: 109 mg/dL — AB (ref 70–99)
POTASSIUM: 4.4 meq/L (ref 3.5–5.1)
Sodium: 143 mEq/L (ref 135–145)

## 2017-06-22 LAB — TSH: TSH: 2.94 u[IU]/mL (ref 0.35–4.50)

## 2017-06-22 LAB — HEMOGLOBIN A1C: Hgb A1c MFr Bld: 7.5 % — ABNORMAL HIGH (ref 4.6–6.5)

## 2017-06-26 ENCOUNTER — Ambulatory Visit: Payer: BC Managed Care – PPO | Admitting: Internal Medicine

## 2017-06-26 ENCOUNTER — Encounter: Payer: Self-pay | Admitting: Internal Medicine

## 2017-06-26 DIAGNOSIS — E119 Type 2 diabetes mellitus without complications: Secondary | ICD-10-CM | POA: Diagnosis not present

## 2017-06-26 DIAGNOSIS — I10 Essential (primary) hypertension: Secondary | ICD-10-CM

## 2017-06-26 DIAGNOSIS — R945 Abnormal results of liver function studies: Secondary | ICD-10-CM

## 2017-06-26 DIAGNOSIS — E7800 Pure hypercholesterolemia, unspecified: Secondary | ICD-10-CM | POA: Diagnosis not present

## 2017-06-26 DIAGNOSIS — K7689 Other specified diseases of liver: Secondary | ICD-10-CM

## 2017-06-26 NOTE — Progress Notes (Signed)
Patient ID: Gabrielle Yu, female   DOB: [birth year], 67 y.o.   MRN: [id number]   Subjective:    Patient ID: Gabrielle Yu, female    DOB: 1950/11/03, 67 y.o.   MRN: [id number]  HPI  Patient here for a scheduled follow up.  She reports she is doing well.  Feels good.  Trying to stay active.  Has just retired.  Plans to start exercising more.  She reports her sugars are averaging 110-120 in the am.  Does not check regularly in the pm.  No chest pain.  No sob.  No acid reflux.  No abdominal pain.  Bowels moving.  No urine change.  Discussed labs.  Discussed a1c - 7.5.  Discussed starting a new medication for her sugars.  She declines.  On crestor.     Past Medical History:  Diagnosis Date  . Diabetes mellitus ([place])   . High cholesterol   . Hypercholesterolemia   . Hypertension    Past Surgical History:  Procedure Laterality Date  . BREAST BIOPSY Left 2014   core with clip  . CATARACT EXTRACTION W/PHACO Right 01/24/2017   Procedure: CATARACT EXTRACTION PHACO AND INTRAOCULAR LENS PLACEMENT ([place]) RIGHT DIABETIC COMPLICATED;  Surgeon: Leandrew Koyanagi, MD;  Location: [facility];  Service: Ophthalmology;  Laterality: Right;  Diabetic - oral meds  . TONSILLECTOMY AND ADENOIDECTOMY  1958  . TUBAL LIGATION  1982   Family History  Problem Relation Age of Onset  . Breast cancer Mother 21  . Hypertension Mother   . Heart disease Father        myocardial infarction - died age 57  . Heart disease Unknown        paternal aunts and uncles  . Colon cancer Neg Hx    Social History   Socioeconomic History  . Marital status: Married    Spouse name: None  . Number of children: 2  . Years of education: None  . Highest education level: None  Social Needs  . Financial resource strain: None  . Food insecurity - worry: None  . Food insecurity - inability: None  . Transportation needs - medical: None  . Transportation needs - non-medical: None  Occupational History   Employer: [employer]  Tobacco Use  . Smoking status: Never Smoker  . Smokeless tobacco: Never Used  Substance and Sexual Activity  . Alcohol use: No    Alcohol/week: 0.0 oz  . Drug use: No  . Sexual activity: None  Other Topics Concern  . None  Social History Narrative  . None    Outpatient Encounter Medications as of 06/26/2017  Medication Sig  . amLODipine (NORVASC) 5 MG tablet Take 1 tablet (5 mg total) by mouth daily.  Marland Kitchen lisinopril (PRINIVIL,ZESTRIL) 40 MG tablet TAKE 1 TABLET BY MOUTH DAILY  . metFORMIN (GLUCOPHAGE) 500 MG tablet Take 2 tablets (1,000 mg total) by mouth 2 (two) times daily.  . rosuvastatin (CRESTOR) 10 MG tablet TAKE 1 TABLET BY MOUTH DAILY   No facility-administered encounter medications on file as of 06/26/2017.     Review of Systems  Constitutional: Negative for appetite change and unexpected weight change.  HENT: Negative for congestion and sinus pressure.   Respiratory: Negative for cough, chest tightness and shortness of breath.   Cardiovascular: Negative for chest pain, palpitations and leg swelling.  Gastrointestinal: Negative for abdominal pain, diarrhea, nausea and vomiting.  Genitourinary: Negative for difficulty urinating and dysuria.  Musculoskeletal: Negative for joint swelling and  myalgias.  Skin: Negative for color change and rash.  Neurological: Negative for dizziness, light-headedness and headaches.  Psychiatric/Behavioral: Negative for agitation and dysphoric mood.       Objective:    Physical Exam  Constitutional: She appears well-developed and well-nourished. No distress.  HENT:  Nose: Nose normal.  Mouth/Throat: Oropharynx is clear and moist.  Neck: Neck supple. No thyromegaly present.  Cardiovascular: Normal rate and regular rhythm.  Pulmonary/Chest: Breath sounds normal. No respiratory distress. She has no wheezes.  Abdominal: Soft. Bowel sounds are normal. There is no tenderness.  Musculoskeletal: She  exhibits no edema or tenderness.  Lymphadenopathy:    She has no cervical adenopathy.  Skin: No rash noted. No erythema.  Psychiatric: Her behavior is normal.    BP (!) 148/80 (BP Location: Left Arm, Patient Position: Sitting, Cuff Size: Normal)   Pulse 96   Temp 98.2 F (36.8 C) (Oral)   Resp 18   Wt 156 lb (70.8 kg)   SpO2 98%   BMI 26.78 kg/m  Wt Readings from Last 3 Encounters:  06/26/17 156 lb (70.8 kg)  05/15/17 159 lb 12.8 oz (72.5 kg)  02/09/17 154 lb 6.4 oz (70 kg)     Lab Results  Component Value Date   WBC 8.4 11/17/2016   HGB 13.6 11/17/2016   HCT 41.4 11/17/2016   PLT 294 11/17/2016   GLUCOSE 109 (H) 06/22/2017   CHOL 133 06/22/2017   TRIG 96.0 06/22/2017   HDL 43.40 06/22/2017   LDLDIRECT 157.1 01/21/2013   LDLCALC 70 06/22/2017   ALT 34 06/22/2017   AST 20 06/22/2017   NA 143 06/22/2017   K 4.4 06/22/2017   CL 106 06/22/2017   CREATININE 0.82 06/22/2017   BUN 14 06/22/2017   CO2 30 06/22/2017   TSH 2.94 06/22/2017   HGBA1C 7.5 (H) 06/22/2017   MICROALBUR 2.5 11/17/2016       Assessment & Plan:   Problem List Items Addressed This Visit    Abnormal liver function    Low carb diet, exercise and weight loss.  Follow liver function tests.        Diabetes mellitus ([facility])    Recent a1c 7.5.  On metformin.  Discussed starting a new medication.  She declines. Discussed low carb diet and exercise.  Follow met b and a1c.        Relevant Orders   Hemoglobin D1S   Basic metabolic panel   Hypercholesteremia    On crestor.  Low cholesterol diet and exercise.  Follow lipid panel and liver function tests.        Relevant Orders   Hepatic function panel   Lipid panel   Hypertension    Blood pressure on recheck improved.  Same medication regimen.  Follow pressures.  Follow metabolic panel.            Einar Pheasant, MD

## 2017-06-29 ENCOUNTER — Encounter: Payer: Self-pay | Admitting: Internal Medicine

## 2017-06-30 NOTE — Assessment & Plan Note (Signed)
Blood pressure on recheck improved.  Same medication regimen.  Follow pressures.  Follow metabolic panel.   

## 2017-06-30 NOTE — Assessment & Plan Note (Signed)
On crestor.  Low cholesterol diet and exercise.  Follow lipid panel and liver function tests.   

## 2017-06-30 NOTE — Assessment & Plan Note (Signed)
Recent a1c 7.5.  On metformin.  Discussed starting a new medication.  She declines. Discussed low carb diet and exercise.  Follow met b and a1c.

## 2017-06-30 NOTE — Assessment & Plan Note (Signed)
Low carb diet, exercise and weight loss.  Follow liver function tests.   

## 2017-08-06 ENCOUNTER — Other Ambulatory Visit: Payer: Self-pay | Admitting: Internal Medicine

## 2017-09-25 ENCOUNTER — Other Ambulatory Visit: Payer: Self-pay | Admitting: Internal Medicine

## 2017-09-28 ENCOUNTER — Other Ambulatory Visit (INDEPENDENT_AMBULATORY_CARE_PROVIDER_SITE_OTHER): Payer: Medicare Other

## 2017-09-28 DIAGNOSIS — E7800 Pure hypercholesterolemia, unspecified: Secondary | ICD-10-CM | POA: Diagnosis not present

## 2017-09-28 DIAGNOSIS — E119 Type 2 diabetes mellitus without complications: Secondary | ICD-10-CM

## 2017-09-28 LAB — BASIC METABOLIC PANEL
BUN: 14 mg/dL (ref 6–23)
CO2: 28 mEq/L (ref 19–32)
Calcium: 9.5 mg/dL (ref 8.4–10.5)
Chloride: 104 mEq/L (ref 96–112)
Creatinine, Ser: 0.86 mg/dL (ref 0.40–1.20)
GFR: 69.89 mL/min (ref >60.00)
GLUCOSE: 136 mg/dL — AB (ref 70–99)
POTASSIUM: 4.7 meq/L (ref 3.5–5.1)
SODIUM: 141 meq/L (ref 135–145)

## 2017-09-28 LAB — HEPATIC FUNCTION PANEL
ALBUMIN: 4.5 g/dL (ref 3.5–5.2)
ALK PHOS: 62 U/L (ref 39–117)
ALT: 48 U/L — AB (ref 0–35)
AST: 31 U/L (ref 0–37)
Bilirubin, Direct: 0.1 mg/dL (ref 0.0–0.3)
Total Bilirubin: 0.4 mg/dL (ref 0.2–1.2)
Total Protein: 7.1 g/dL (ref 6.0–8.3)

## 2017-09-28 LAB — HEMOGLOBIN A1C: HEMOGLOBIN A1C: 7.6 % — AB (ref 4.6–6.5)

## 2017-09-28 LAB — LIPID PANEL
CHOLESTEROL: 149 mg/dL (ref 0–200)
HDL: 39.7 mg/dL (ref >39.00)
LDL Cholesterol: 87 mg/dL (ref 0–99)
NONHDL: 109.38
Total CHOL/HDL Ratio: 4
Triglycerides: 110 mg/dL (ref 0.0–149.0)
VLDL: 22 mg/dL (ref 0.0–40.0)

## 2017-10-02 ENCOUNTER — Ambulatory Visit: Payer: BC Managed Care – PPO | Admitting: Internal Medicine

## 2017-10-03 ENCOUNTER — Ambulatory Visit (INDEPENDENT_AMBULATORY_CARE_PROVIDER_SITE_OTHER): Payer: Medicare Other | Admitting: Internal Medicine

## 2017-10-03 VITALS — BP 136/78 | HR 87 | Temp 98.3°F | Resp 18 | Wt 159.0 lb

## 2017-10-03 DIAGNOSIS — Z1231 Encounter for screening mammogram for malignant neoplasm of breast: Secondary | ICD-10-CM

## 2017-10-03 DIAGNOSIS — E7800 Pure hypercholesterolemia, unspecified: Secondary | ICD-10-CM

## 2017-10-03 DIAGNOSIS — R945 Abnormal results of liver function studies: Secondary | ICD-10-CM | POA: Diagnosis not present

## 2017-10-03 DIAGNOSIS — R928 Other abnormal and inconclusive findings on diagnostic imaging of breast: Secondary | ICD-10-CM

## 2017-10-03 DIAGNOSIS — E119 Type 2 diabetes mellitus without complications: Secondary | ICD-10-CM | POA: Diagnosis not present

## 2017-10-03 DIAGNOSIS — I10 Essential (primary) hypertension: Secondary | ICD-10-CM

## 2017-10-03 DIAGNOSIS — F439 Reaction to severe stress, unspecified: Secondary | ICD-10-CM

## 2017-10-03 DIAGNOSIS — Z1239 Encounter for other screening for malignant neoplasm of breast: Secondary | ICD-10-CM

## 2017-10-03 MED ORDER — EMPAGLIFLOZIN 10 MG PO TABS: 10.0000 mg | ORAL_TABLET | Freq: Every day | ORAL | 1 refills | Status: DC

## 2017-10-03 MED ORDER — BUSPIRONE HCL 5 MG PO TABS: 5.0000 mg | ORAL_TABLET | Freq: Every day | ORAL | 1 refills | Status: DC

## 2017-10-03 NOTE — Progress Notes (Signed)
Patient ID: Gabrielle Yu, female   DOB: 1951-02-19, 67 y.o.   MRN: [id number]   Subjective:    Patient ID: Gabrielle Yu, female    DOB: 03/30/1951, 67 y.o.   MRN: [id number]  HPI  Patient here for a scheduled follow up.  Increased stress with her husband's health issues and also some issues with her son.  Discussed with her today.  Overall she feels she is handling things relatively well.  Does feel she needs something to help with some stress and anxiety.  Had previous rash to zoloft.  Discussed treatment options.  Discussed buspar.  She has not been watching her diet as well.  Not exercising regularly. Discussed labs and elevated a1c.  Discussed medication options.  Discussed adding jardiance.  No chest pain.  No sob.  No acid reflux.  No abdominal pain.  Bowels moving.  No urine change.  Discussed the need for diet and exercise.     Past Medical History:  Diagnosis Date  . Diabetes mellitus ([place])   . High cholesterol   . Hypercholesterolemia   . Hypertension    Past Surgical History:  Procedure Laterality Date  . BREAST BIOPSY Left 2014   core with clip  . CATARACT EXTRACTION W/PHACO Right 01/24/2017   Procedure: CATARACT EXTRACTION PHACO AND INTRAOCULAR LENS PLACEMENT ([place]) RIGHT DIABETIC COMPLICATED;  Surgeon: Leandrew Koyanagi, MD;  Location: [facility];  Service: Ophthalmology;  Laterality: Right;  Diabetic - oral meds  . TONSILLECTOMY AND ADENOIDECTOMY  1958  . TUBAL LIGATION  1982   Family History  Problem Relation Age of Onset  . Breast cancer Mother 71  . Hypertension Mother   . Heart disease Father        myocardial infarction - died age 70  . Heart disease Unknown        paternal aunts and uncles  . Colon cancer Neg Hx    Social History   Socioeconomic History  . Marital status: Married    Spouse name: Not on file  . Number of children: 2  . Years of education: Not on file  . Highest education level: Not on file  Occupational History   Employer: [employer]  Social Needs  . Financial resource strain: Not on file  . Food insecurity:    Worry: Not on file    Inability: Not on file  . Transportation needs:    Medical: Not on file    Non-medical: Not on file  Tobacco Use  . Smoking status: Never Smoker  . Smokeless tobacco: Never Used  Substance and Sexual Activity  . Alcohol use: No    Alcohol/week: 0.0 oz  . Drug use: No  . Sexual activity: Not on file  Lifestyle  . Physical activity:    Days per week: Not on file    Minutes per session: Not on file  . Stress: Not on file  Relationships  . Social connections:    Talks on phone: Not on file    Gets together: Not on file    Attends religious service: Not on file    Active member of club or organization: Not on file    Attends meetings of clubs or organizations: Not on file    Relationship status: Not on file  Other Topics Concern  . Not on file  Social History Narrative  . Not on file    Outpatient Encounter Medications as of 10/03/2017  Medication Sig  . amLODipine (NORVASC) 5  MG tablet TAKE 1 TABLET(5 MG) BY MOUTH DAILY  . lisinopril (PRINIVIL,ZESTRIL) 40 MG tablet TAKE 1 TABLET BY MOUTH DAILY  . metFORMIN (GLUCOPHAGE) 500 MG tablet Take 2 tablets (1,000 mg total) by mouth 2 (two) times daily.  . rosuvastatin (CRESTOR) 10 MG tablet TAKE 1 TABLET BY MOUTH DAILY  . busPIRone (BUSPAR) 5 MG tablet Take 1 tablet (5 mg total) by mouth daily.  . empagliflozin (JARDIANCE) 10 MG TABS tablet Take 10 mg by mouth daily.   No facility-administered encounter medications on file as of 10/03/2017.     Review of Systems  Constitutional: Negative for appetite change and unexpected weight change.  HENT: Negative for congestion and sinus pressure.   Respiratory: Negative for cough, chest tightness and shortness of breath.   Cardiovascular: Negative for chest pain, palpitations and leg swelling.  Gastrointestinal: Negative for abdominal pain,  diarrhea, nausea and vomiting.  Genitourinary: Negative for difficulty urinating and dysuria.  Musculoskeletal: Negative for joint swelling and myalgias.  Skin: Negative for color change and rash.  Neurological: Negative for dizziness, light-headedness and headaches.  Psychiatric/Behavioral: Negative for dysphoric mood.       Increased stress as outlined.         Objective:    Physical Exam  Constitutional: She appears well-developed and well-nourished. No distress.  HENT:  Nose: Nose normal.  Mouth/Throat: Oropharynx is clear and moist.  Neck: Neck supple. No thyromegaly present.  Cardiovascular: Normal rate and regular rhythm.  Pulmonary/Chest: Breath sounds normal. No respiratory distress. She has no wheezes.  Abdominal: Soft. Bowel sounds are normal. There is no tenderness.  Musculoskeletal: She exhibits no edema or tenderness.  Lymphadenopathy:    She has no cervical adenopathy.  Skin: No rash noted. No erythema.  Psychiatric: She has a normal mood and affect. Her behavior is normal.    BP 136/78 (BP Location: Left Arm, Patient Position: Sitting, Cuff Size: Normal)   Pulse 87   Temp 98.3 F (36.8 C) (Oral)   Resp 18   Wt 159 lb (72.1 kg)   SpO2 98%   BMI 27.29 kg/m  Wt Readings from Last 3 Encounters:  10/03/17 159 lb (72.1 kg)  06/26/17 156 lb (70.8 kg)  05/15/17 159 lb 12.8 oz (72.5 kg)     Lab Results  Component Value Date   WBC 8.4 11/17/2016   HGB 13.6 11/17/2016   HCT 41.4 11/17/2016   PLT 294 11/17/2016   GLUCOSE 136 (H) 09/28/2017   CHOL 149 09/28/2017   TRIG 110.0 09/28/2017   HDL 39.70 09/28/2017   LDLDIRECT 157.1 01/21/2013   LDLCALC 87 09/28/2017   ALT 48 (H) 09/28/2017   AST 31 09/28/2017   NA 141 09/28/2017   K 4.7 09/28/2017   CL 104 09/28/2017   CREATININE 0.86 09/28/2017   BUN 14 09/28/2017   CO2 28 09/28/2017   TSH 2.94 06/22/2017   HGBA1C 7.6 (H) 09/28/2017   MICROALBUR 2.5 11/17/2016       Assessment & Plan:   Problem  List Items Addressed This Visit    Abnormal liver function    ALT slightly elevated at 48.  Remainder of liver panel wnl.  Discussed diet and exercise.  Previous abdominal ultrasound revealed increased liver echogenicity.  See report.  Have discussed with her regarding f/u ultrasound and GI evaluation.  She wants to hold.  Diet, exercise and weight loss.  Follow liver panel.       Abnormal mammogram    Previously saw Dr Bary Castilla.  S/p biopsy - ok.  Mammogram 11/30/16 - Birads I.  Scheduled for f/u mammogram.        Diabetes mellitus ([place])    Discussed persistent elevated a1c.  a1c 7.6.  On metformin.  Add Jardiance.  Discussed need to stop if sick.  Also discussed possible increase in yeast infection, etc.  Discussed diet and exercise.  Follow met b and a1c.       Relevant Medications   empagliflozin (JARDIANCE) 10 MG TABS tablet   Hypercholesteremia    On crestor.  Low cholesterol diet and exercise.  Follow lipid panel and liver function tests.   Lab Results  Component Value Date   CHOL 149 09/28/2017   HDL 39.70 09/28/2017   LDLCALC 87 09/28/2017   LDLDIRECT 157.1 01/21/2013   TRIG 110.0 09/28/2017   CHOLHDL 4 09/28/2017        Hypertension    Blood pressure under good control.  Continue same medication regimen.  Follow pressures.  Follow metabolic panel.        Stress    Increased stress as outlined.  Discussed with her today.  Rash with zoloft.  Start buspar as directed.  Follow.        Other Visit Diagnoses    Breast cancer screening    -  Primary   Relevant Orders   MM 3D SCREEN BREAST BILATERAL       Einar Pheasant, MD

## 2017-10-06 ENCOUNTER — Encounter: Payer: Self-pay | Admitting: Internal Medicine

## 2017-10-06 DIAGNOSIS — F439 Reaction to severe stress, unspecified: Secondary | ICD-10-CM | POA: Insufficient documentation

## 2017-10-06 NOTE — Assessment & Plan Note (Signed)
ALT slightly elevated at 48.  Remainder of liver panel wnl.  Discussed diet and exercise.  Previous abdominal ultrasound revealed increased liver echogenicity.  See report.  Have discussed with her regarding f/u ultrasound and GI evaluation.  She wants to hold.  Diet, exercise and weight loss.  Follow liver panel.

## 2017-10-06 NOTE — Assessment & Plan Note (Signed)
Discussed persistent elevated a1c.  a1c 7.6.  On metformin.  Add Jardiance.  Discussed need to stop if sick.  Also discussed possible increase in yeast infection, etc.  Discussed diet and exercise.  Follow met b and a1c.

## 2017-10-06 NOTE — Assessment & Plan Note (Signed)
Previously saw Dr Lemar LivingsByrnett.  S/p biopsy - ok.  Mammogram 11/30/16 - Birads I.  Scheduled for f/u mammogram.

## 2017-10-06 NOTE — Assessment & Plan Note (Signed)
Blood pressure under good control.  Continue same medication regimen.  Follow pressures.  Follow metabolic panel.   

## 2017-10-06 NOTE — Assessment & Plan Note (Signed)
Increased stress as outlined.  Discussed with her today.  Rash with zoloft.  Start buspar as directed.  Follow.

## 2017-10-06 NOTE — Assessment & Plan Note (Signed)
On crestor.  Low cholesterol diet and exercise.  Follow lipid panel and liver function tests.   Lab Results  Component Value Date   CHOL 149 09/28/2017   HDL 39.70 09/28/2017   LDLCALC 87 09/28/2017   LDLDIRECT 157.1 01/21/2013   TRIG 110.0 09/28/2017   CHOLHDL 4 09/28/2017

## 2017-11-27 ENCOUNTER — Ambulatory Visit: Payer: Medicare Other | Admitting: Internal Medicine

## 2017-11-27 ENCOUNTER — Encounter: Payer: Self-pay | Admitting: Internal Medicine

## 2017-11-27 DIAGNOSIS — E119 Type 2 diabetes mellitus without complications: Secondary | ICD-10-CM | POA: Diagnosis not present

## 2017-11-27 DIAGNOSIS — R945 Abnormal results of liver function studies: Secondary | ICD-10-CM | POA: Diagnosis not present

## 2017-11-27 DIAGNOSIS — I10 Essential (primary) hypertension: Secondary | ICD-10-CM

## 2017-11-27 DIAGNOSIS — E7800 Pure hypercholesterolemia, unspecified: Secondary | ICD-10-CM

## 2017-11-27 DIAGNOSIS — F439 Reaction to severe stress, unspecified: Secondary | ICD-10-CM

## 2017-11-27 NOTE — Progress Notes (Signed)
Patient ID: Gabrielle Yu, female   DOB: 29-Oct-1950, 67 y.o.   MRN: [id number]   Subjective:    Patient ID: Gabrielle Yu, female    DOB: 08-08-1950, 67 y.o.   MRN: [id number]  HPI  Patient here for a scheduled follow up. She reports she is doing better.  Feels the buspar is working well.  Dealing with increased stress.  Husband's recent scan - looked good.  Discussed increased stress.  Does not feel she needs any further intervention.  Started on [place].  Blood sugars averaging 100-140.  Highest reading 140s.  No chest pain.  No sob.  No acid reflux.  No abdominal pain.  Bowels moving.     Past Medical History:  Diagnosis Date  . Diabetes mellitus ([place])   . High cholesterol   . Hypercholesterolemia   . Hypertension    Past Surgical History:  Procedure Laterality Date  . BREAST BIOPSY Left 2014   core with clip  . CATARACT EXTRACTION W/PHACO Right 01/24/2017   Procedure: CATARACT EXTRACTION PHACO AND INTRAOCULAR LENS PLACEMENT ([place]) RIGHT DIABETIC COMPLICATED;  Surgeon: Leandrew Koyanagi, MD;  Location: [place];  Service: Ophthalmology;  Laterality: Right;  Diabetic - oral meds  . TONSILLECTOMY AND ADENOIDECTOMY  1958  . TUBAL LIGATION  1982   Family History  Problem Relation Age of Onset  . Breast cancer Mother 21  . Hypertension Mother   . Heart disease Father        myocardial infarction - died age 50  . Heart disease Unknown        paternal aunts and uncles  . Colon cancer Neg Hx    Social History   Socioeconomic History  . Marital status: Married    Spouse name: Not on file  . Number of children: 2  . Years of education: Not on file  . Highest education level: Not on file  Occupational History    Employer: [employer]  Social Needs  . Financial resource strain: Not on file  . Food insecurity:    Worry: Not on file    Inability: Not on file  . Transportation needs:    Medical: Not on file    Non-medical: Not on  file  Tobacco Use  . Smoking status: Never Smoker  . Smokeless tobacco: Never Used  Substance and Sexual Activity  . Alcohol use: No    Alcohol/week: 0.0 oz  . Drug use: No  . Sexual activity: Not on file  Lifestyle  . Physical activity:    Days per week: Not on file    Minutes per session: Not on file  . Stress: Not on file  Relationships  . Social connections:    Talks on phone: Not on file    Gets together: Not on file    Attends religious service: Not on file    Active member of club or organization: Not on file    Attends meetings of clubs or organizations: Not on file    Relationship status: Not on file  Other Topics Concern  . Not on file  Social History Narrative  . Not on file    Outpatient Encounter Medications as of 11/27/2017  Medication Sig  . amLODipine (NORVASC) 5 MG tablet TAKE 1 TABLET(5 MG) BY MOUTH DAILY  . busPIRone (BUSPAR) 5 MG tablet Take 1 tablet (5 mg total) by mouth daily.  . empagliflozin (JARDIANCE) 10 MG TABS tablet Take 10 mg by mouth daily.  Marland Kitchen lisinopril (  PRINIVIL,ZESTRIL) 40 MG tablet TAKE 1 TABLET BY MOUTH DAILY  . metFORMIN (GLUCOPHAGE) 500 MG tablet Take 2 tablets (1,000 mg total) by mouth 2 (two) times daily.  . rosuvastatin (CRESTOR) 10 MG tablet TAKE 1 TABLET BY MOUTH DAILY   No facility-administered encounter medications on file as of 11/27/2017.     Review of Systems  Constitutional: Negative for appetite change and unexpected weight change.  HENT: Negative for congestion and sinus pressure.   Respiratory: Negative for cough, chest tightness and shortness of breath.   Cardiovascular: Negative for chest pain, palpitations and leg swelling.  Gastrointestinal: Negative for abdominal pain, nausea and vomiting.  Genitourinary: Negative for difficulty urinating and dysuria.  Musculoskeletal: Negative for joint swelling and myalgias.  Skin: Negative for color change and rash.  Neurological: Negative for dizziness, light-headedness and  headaches.  Psychiatric/Behavioral: Negative for agitation and dysphoric mood.       Objective:     Blood presure rechecked by me:  130/78  Physical Exam  Constitutional: She appears well-developed and well-nourished. No distress.  HENT:  Nose: Nose normal.  Mouth/Throat: Oropharynx is clear and moist.  Neck: Neck supple. No thyromegaly present.  Cardiovascular: Normal rate and regular rhythm.  Pulmonary/Chest: Breath sounds normal. No respiratory distress. She has no wheezes.  Abdominal: Soft. Bowel sounds are normal. There is no tenderness.  Musculoskeletal: She exhibits no edema or tenderness.  Lymphadenopathy:    She has no cervical adenopathy.  Neurological: She displays normal reflexes.  Skin: No rash noted. No erythema.  Psychiatric: She has a normal mood and affect. Her behavior is normal.    BP 130/76   Pulse 77   Temp 98.1 F (36.7 C) (Oral)   Resp 15   Ht 5' 4"  (1.626 m)   Wt 158 lb (71.7 kg)   SpO2 100%   BMI 27.12 kg/m  Wt Readings from Last 3 Encounters:  11/27/17 158 lb (71.7 kg)  10/03/17 159 lb (72.1 kg)  06/26/17 156 lb (70.8 kg)     Lab Results  Component Value Date   WBC 8.4 11/17/2016   HGB 13.6 11/17/2016   HCT 41.4 11/17/2016   PLT 294 11/17/2016   GLUCOSE 136 (H) 09/28/2017   CHOL 149 09/28/2017   TRIG 110.0 09/28/2017   HDL 39.70 09/28/2017   LDLDIRECT 157.1 01/21/2013   LDLCALC 87 09/28/2017   ALT 48 (H) 09/28/2017   AST 31 09/28/2017   NA 141 09/28/2017   K 4.7 09/28/2017   CL 104 09/28/2017   CREATININE 0.86 09/28/2017   BUN 14 09/28/2017   CO2 28 09/28/2017   TSH 2.94 06/22/2017   HGBA1C 7.6 (H) 09/28/2017   MICROALBUR 2.5 11/17/2016       Assessment & Plan:   Problem List Items Addressed This Visit    Abnormal liver function    Continue diet and exercise.  Follow liver function.  Previous abdominal ultrasound revealed increased liver echogenicity.  Declines f/u ultrasound and f/u with GI.        Relevant Orders     Hepatic function panel   Diabetes mellitus ([place])    Low carb diet and exercise.  Jardiance added last visit.  Discussed stopping Jardiance if sick.  Sugars improved.  Discussed diet and exercise.  Follow met b and a1c.        Relevant Orders   Hemoglobin N0N   Basic metabolic panel   Microalbumin / creatinine urine ratio   Hypercholesteremia    On crestor.  Low cholesterol diet and exercise.  Follow lipid panel and liver function tests.        Relevant Orders   Lipid panel   Hypertension    Blood pressure on recheck improved.  Same medication regimen.  Follow pressures.  Follow metabolic panel.        Relevant Orders   CBC with Differential/Platelet   Stress    Increased stress.  Doing better.  Discussed with her today.  buspar working well for her.  Follow.            Einar Pheasant, MD

## 2017-11-27 NOTE — Patient Instructions (Signed)
Take 2 metformin with breakfast and two metformin with your evening meal.   Take your cholesterol medication in the evening.    Ok to take your other medications in the morning  If you get sick (ex, vomiting and diarrhea) - you need to stop the jardiance as we discussed.

## 2017-11-28 ENCOUNTER — Encounter: Payer: Self-pay | Admitting: Internal Medicine

## 2017-11-28 NOTE — Assessment & Plan Note (Signed)
Continue diet and exercise.  Follow liver function.  Previous abdominal ultrasound revealed increased liver echogenicity.  Declines f/u ultrasound and f/u with GI.

## 2017-11-28 NOTE — Assessment & Plan Note (Signed)
On crestor.  Low cholesterol diet and exercise.  Follow lipid panel and liver function tests.   

## 2017-11-28 NOTE — Assessment & Plan Note (Signed)
Low carb diet and exercise.  Jardiance added last visit.  Discussed stopping Jardiance if sick.  Sugars improved.  Discussed diet and exercise.  Follow met b and a1c.   

## 2017-11-28 NOTE — Assessment & Plan Note (Signed)
Blood pressure on recheck improved.  Same medication regimen.  Follow pressures.  Follow metabolic panel.   

## 2017-11-28 NOTE — Assessment & Plan Note (Signed)
Increased stress.  Doing better.  Discussed with her today.  buspar working well for her.  Follow.

## 2017-12-02 ENCOUNTER — Other Ambulatory Visit: Payer: Self-pay | Admitting: Internal Medicine

## 2017-12-03 ENCOUNTER — Ambulatory Visit
Admission: RE | Admit: 2017-12-03 | Discharge: 2017-12-03 | Disposition: A | Discharge status: Home / Self Care | Payer: Medicare Other | Source: Ambulatory Visit | Attending: Internal Medicine | Admitting: Internal Medicine

## 2017-12-03 DIAGNOSIS — Z1231 Encounter for screening mammogram for malignant neoplasm of breast: Secondary | ICD-10-CM | POA: Insufficient documentation

## 2017-12-03 DIAGNOSIS — Z1239 Encounter for other screening for malignant neoplasm of breast: Secondary | ICD-10-CM

## 2018-01-01 ENCOUNTER — Other Ambulatory Visit: Payer: Self-pay | Admitting: Internal Medicine

## 2018-02-13 ENCOUNTER — Other Ambulatory Visit: Payer: Self-pay | Admitting: Internal Medicine

## 2018-02-15 ENCOUNTER — Ambulatory Visit (INDEPENDENT_AMBULATORY_CARE_PROVIDER_SITE_OTHER): Payer: Medicare Other

## 2018-02-15 DIAGNOSIS — Z23 Encounter for immunization: Secondary | ICD-10-CM

## 2018-03-06 ENCOUNTER — Ambulatory Visit: Payer: Self-pay

## 2018-03-06 ENCOUNTER — Telehealth: Payer: Self-pay | Admitting: Internal Medicine

## 2018-03-06 NOTE — Telephone Encounter (Signed)
Patient called with c/o "sinus congestion, cough, sore throat." She says "this started about Saturday or Sunday, sinus congestion, sore throat. It drains to the back of my throat mostly at night. I cough, but nothing comes up. I blew my nose once and it was yellow. I believe I have a sinus infection  I am going out of town this weekend and would like something called in before I go." I advised she will need to be seen in the office before anything can be prescribed, she verbalized understanding. I asked about fever, her breathing, she says "my breathing is fine and I don't have a fever." I asked about other symptoms, she says "I'm hoarse and my throat is sore. No earache, no wheezing and vomiting." According to protocol, see PCP within 24 hours, no availability with PCP, appointment scheduled for tomorrow, 03/07/18 at 1120 with Leanora CoverLauren Guse, FNP, care advice given, patient verbalized understanding.   Reason for Disposition . [1] SEVERE sore throat AND [2] present > 24 hours  Answer Assessment - Initial Assessment Questions 1. ONSET: "When did the nasal discharge start?"      Over the weekend 2. AMOUNT: "How much discharge is there?"      Draining more at night to the back of throat; not really draining out of nose; blew nose once and it was yellow. 3. COUGH: "Do you have a cough?" If yes, ask: "Describe the color of your sputum" (clear, white, yellow, green)     Yes, not coughing up anything 4. RESPIRATORY DISTRESS: "Describe your breathing."      Breathing is fine 5. FEVER: "Do you have a fever?" If so, ask: "What is your temperature, how was it measured, and when did it start?"     No 6. SEVERITY: "Overall, how bad are you feeling right now?" (e.g., doesn't interfere with normal activities, staying home from school/work, staying in bed)      I don't get out like I normally would 7. OTHER SYMPTOMS: "Do you have any other symptoms?" (e.g., sore throat, earache, wheezing, vomiting)     Sore throat,  hoarse 8. PREGNANCY: "Is there any chance you are pregnant?" "When was your last menstrual period?"     No  Protocols used: COMMON COLD-A-AH

## 2018-03-06 NOTE — Telephone Encounter (Signed)
Copied from CRM [phone]. Topic: Quick Communication - See Telephone Encounter >> Mar 06, 2018  1:51 PM Waymon AmatoBurton, Donna F wrote: Pt is showing signs of cough and congestion and is going out of town and would like to know if something could be called [facility] [place] she does have an appt  With dr Lorin Picketscott on 03/12/18 and her next available is going to be 05/2018 to try and  be seen before the [id number].  [facility] [address]    Best number [phone]

## 2018-03-06 NOTE — Telephone Encounter (Signed)
See nurse triage encounter.

## 2018-03-07 ENCOUNTER — Ambulatory Visit: Payer: Medicare Other | Admitting: Family Medicine

## 2018-03-07 ENCOUNTER — Encounter: Payer: Self-pay | Admitting: Family Medicine

## 2018-03-07 VITALS — BP 126/70 | HR 94 | Temp 99.2°F | Ht 64.0 in | Wt 160.6 lb

## 2018-03-07 DIAGNOSIS — R05 Cough: Secondary | ICD-10-CM | POA: Diagnosis not present

## 2018-03-07 DIAGNOSIS — R0982 Postnasal drip: Secondary | ICD-10-CM | POA: Diagnosis not present

## 2018-03-07 DIAGNOSIS — R059 Cough, unspecified: Secondary | ICD-10-CM

## 2018-03-07 DIAGNOSIS — J019 Acute sinusitis, unspecified: Secondary | ICD-10-CM | POA: Diagnosis not present

## 2018-03-07 MED ORDER — GUAIFENESIN-CODEINE 100-10 MG/5ML PO SOLN: 10.0000 mL | Freq: Three times a day (TID) | ORAL | 0 refills | Status: DC | PRN

## 2018-03-07 MED ORDER — AMOXICILLIN 875 MG PO TABS
875.0000 mg | ORAL_TABLET | Freq: Two times a day (BID) | ORAL | 0 refills | Status: DC
Start: 2018-03-07 — End: 2018-10-02

## 2018-03-07 NOTE — Progress Notes (Signed)
Subjective:    Patient ID: Gabrielle Yu, female    DOB: February 09, 1951, 66 y.o.   MRN: [id number]  HPI  Presents to clinic c/o sinus pressure, drainage, congestion for almost 2 weeks.  Patient states sinus drainage down back of her throat has gotten so bad, it makes her feel like she is choking on her phlegm and is making her cough very harshly.  States the drainage from her nose is thick green/yellow and she is coughing up yellow phlegm.  Patient is using an over-the-counter antihistamine, which has helped a little bit to dry up congestion. She has been using OTC robitussin DM during the day to help cough, but cough keeps her up at night.   Denies fever or chills.  Denies nausea, vomiting or diarrhea.  Patient Active Problem List   Diagnosis Date Noted  . Stress 10/06/2017  . Health care maintenance 09/19/2015  . Sebaceous cyst 06/24/2015  . Neck nodule 06/17/2015  . Abnormal mammogram 08/06/2012  . Abnormal liver function 05/31/2012  . Diabetes mellitus ([facility]) 02/19/2012  . Hypertension 02/19/2012  . Hypercholesteremia 02/19/2012   Social History   Tobacco Use  . Smoking status: Never Smoker  . Smokeless tobacco: Never Used  Substance Use Topics  . Alcohol use: No    Alcohol/week: 0.0 standard drinks   Review of Systems  Constitutional: Negative for chills, fatigue and fever.  HENT: +congestion, sinus pain/pressure/drainage Eyes: Negative.   Respiratory: +cough. Negative for shortness of breath and wheezing.   Cardiovascular: Negative for chest pain, palpitations and leg swelling.  Gastrointestinal: Negative for abdominal pain, diarrhea, nausea and vomiting.  Genitourinary: Negative for dysuria, frequency and urgency.  Musculoskeletal: Negative for arthralgias and myalgias.  Skin: Negative for color change, pallor and rash.  Neurological: Negative for syncope, light-headedness and headaches.  Psychiatric/Behavioral: The patient is not nervous/anxious.         Objective:   Physical Exam  Constitutional: She is oriented to person, place, and time. She appears well-nourished. No distress.  HENT:  Head: Normocephalic and atraumatic.  Nose: Mucosal edema and rhinorrhea present. Right sinus exhibits maxillary sinus tenderness and frontal sinus tenderness. Left sinus exhibits maxillary sinus tenderness and frontal sinus tenderness.  Purulent nasal discharge and very thick postnasal drip.  Eyes: Conjunctivae and EOM are normal. No scleral icterus.  Neck: Neck supple. No tracheal deviation present.  Cardiovascular: Normal rate and regular rhythm.  Pulmonary/Chest: Effort normal and breath sounds normal. No respiratory distress. She has no wheezes. She has no rales.  Neurological: She is alert and oriented to person, place, and time.  Skin: Skin is warm and dry. She is not diaphoretic. No pallor.  Psychiatric: She has a normal mood and affect. Her behavior is normal.  Nursing note and vitals reviewed.     Vitals:   03/07/18 1130  BP: 126/70  Pulse: 94  Temp: 99.2 F (37.3 C)  SpO2: 97%   Assessment & Plan:   Acute sinusitis/postnasal drip- patient will take amoxicillin twice daily for 10 days.  Patient would prefer not to take Augmentin, as this can be tough on stomach and she has upcoming trip planned for this weekend.  Patient advised that with any antibiotic is always good to either eat a daily yogurt or take a probiotic to help replace good gut bacteria.  Patient advised she may continue to use her over-the-counter antihistamine to address congestion.  Advised to rest, increase fluid intake and do good handwashing.  Cough -lungs are clear on  exam, so I suspect cough is related to postnasal drainage.  Advised she can use Robitussin cough syrup during the day, and prescription for Robitussin with codeine sent in to use at night so she can get some rest.  [place] [place] checked and is appropriate for refill of this  medication.  Patient advised to follow-up with PCP as regularly scheduled, and she is aware she can return to clinic sooner if any issues arise.

## 2018-03-07 NOTE — Patient Instructions (Addendum)
You can continue your OTC allergy medication that you have been using  You can use your OTC robitussin during the day for cough, and use robitussin with codeine at night so you can get some rest.

## 2018-03-11 ENCOUNTER — Other Ambulatory Visit (INDEPENDENT_AMBULATORY_CARE_PROVIDER_SITE_OTHER): Payer: Medicare Other

## 2018-03-11 DIAGNOSIS — E119 Type 2 diabetes mellitus without complications: Secondary | ICD-10-CM | POA: Diagnosis not present

## 2018-03-11 DIAGNOSIS — E7800 Pure hypercholesterolemia, unspecified: Secondary | ICD-10-CM | POA: Diagnosis not present

## 2018-03-11 DIAGNOSIS — I10 Essential (primary) hypertension: Secondary | ICD-10-CM | POA: Diagnosis not present

## 2018-03-11 DIAGNOSIS — R945 Abnormal results of liver function studies: Secondary | ICD-10-CM

## 2018-03-11 LAB — HEPATIC FUNCTION PANEL
ALK PHOS: 73 U/L (ref 39–117)
ALT: 68 U/L — ABNORMAL HIGH (ref 0–35)
AST: 49 U/L — ABNORMAL HIGH (ref 0–37)
Albumin: 3.9 g/dL (ref 3.5–5.2)
BILIRUBIN DIRECT: 0.1 mg/dL (ref 0.0–0.3)
TOTAL PROTEIN: 6.8 g/dL (ref 6.0–8.3)
Total Bilirubin: 0.3 mg/dL (ref 0.2–1.2)

## 2018-03-11 LAB — LIPID PANEL
CHOL/HDL RATIO: 4
Cholesterol: 158 mg/dL (ref 0–200)
HDL: 35.9 mg/dL — ABNORMAL LOW (ref >39.00)
LDL Cholesterol: 93 mg/dL (ref 0–99)
NonHDL: 121.64
TRIGLYCERIDES: 145 mg/dL (ref 0.0–149.0)
VLDL: 29 mg/dL (ref 0.0–40.0)

## 2018-03-11 LAB — CBC WITH DIFFERENTIAL/PLATELET
BASOS ABS: 0.1 10*3/uL (ref 0.0–0.1)
Basophils Relative: 1 % (ref 0.0–3.0)
EOS ABS: 0 10*3/uL (ref 0.0–0.7)
Eosinophils Relative: 0 % (ref 0.0–5.0)
HCT: 36.5 % (ref 36.0–46.0)
Hemoglobin: 12.1 g/dL (ref 12.0–15.0)
LYMPHS ABS: 1.9 10*3/uL (ref 0.7–4.0)
Lymphocytes Relative: 30.5 % (ref 12.0–46.0)
MCHC: 33 g/dL (ref 30.0–36.0)
MCV: 87.8 fl (ref 78.0–100.0)
MONO ABS: 0.8 10*3/uL (ref 0.1–1.0)
Monocytes Relative: 12.2 % — ABNORMAL HIGH (ref 3.0–12.0)
NEUTROS ABS: 3.5 10*3/uL (ref 1.4–7.7)
NEUTROS PCT: 56.3 % (ref 43.0–77.0)
PLATELETS: 276 10*3/uL (ref 150.0–400.0)
RBC: 4.16 Mil/uL (ref 3.87–5.11)
RDW: 13.6 % (ref 11.5–15.5)
WBC: 6.3 10*3/uL (ref 4.0–10.5)

## 2018-03-11 LAB — BASIC METABOLIC PANEL
BUN: 9 mg/dL (ref 6–23)
CALCIUM: 8.8 mg/dL (ref 8.4–10.5)
CO2: 28 meq/L (ref 19–32)
CREATININE: 0.8 mg/dL (ref 0.40–1.20)
Chloride: 105 mEq/L (ref 96–112)
GFR: 75.87 mL/min (ref >60.00)
Glucose, Bld: 141 mg/dL — ABNORMAL HIGH (ref 70–99)
Potassium: 4 mEq/L (ref 3.5–5.1)
Sodium: 141 mEq/L (ref 135–145)

## 2018-03-11 LAB — MICROALBUMIN / CREATININE URINE RATIO
CREATININE, U: 368.5 mg/dL
Microalb Creat Ratio: 1.1 mg/g (ref 0.0–30.0)
Microalb, Ur: 3.9 mg/dL — ABNORMAL HIGH (ref 0.0–1.9)

## 2018-03-11 LAB — HEMOGLOBIN A1C: Hgb A1c MFr Bld: 8 % — ABNORMAL HIGH (ref 4.6–6.5)

## 2018-03-12 ENCOUNTER — Encounter: Payer: Self-pay | Admitting: Internal Medicine

## 2018-03-12 ENCOUNTER — Ambulatory Visit: Payer: Medicare Other | Admitting: Internal Medicine

## 2018-03-12 VITALS — BP 142/80 | HR 86 | Temp 98.0°F | Resp 18 | Wt 161.4 lb

## 2018-03-12 DIAGNOSIS — R945 Abnormal results of liver function studies: Secondary | ICD-10-CM

## 2018-03-12 DIAGNOSIS — E119 Type 2 diabetes mellitus without complications: Secondary | ICD-10-CM

## 2018-03-12 DIAGNOSIS — Z0000 Encounter for general adult medical examination without abnormal findings: Secondary | ICD-10-CM

## 2018-03-12 DIAGNOSIS — E7800 Pure hypercholesterolemia, unspecified: Secondary | ICD-10-CM

## 2018-03-12 DIAGNOSIS — F439 Reaction to severe stress, unspecified: Secondary | ICD-10-CM

## 2018-03-12 DIAGNOSIS — J329 Chronic sinusitis, unspecified: Secondary | ICD-10-CM

## 2018-03-12 DIAGNOSIS — I10 Essential (primary) hypertension: Secondary | ICD-10-CM

## 2018-03-12 MED ORDER — BENZONATATE 100 MG PO CAPS: 100.0000 mg | ORAL_CAPSULE | Freq: Three times a day (TID) | ORAL | 0 refills | Status: DC | PRN

## 2018-03-12 MED ORDER — EMPAGLIFLOZIN 25 MG PO TABS: 25.0000 mg | ORAL_TABLET | Freq: Every day | ORAL | 2 refills | Status: DC

## 2018-03-12 NOTE — Assessment & Plan Note (Signed)
Physical today 03/12/18.  Mammogram 12/03/17 - birads I.  She declines colonoscopy.

## 2018-03-12 NOTE — Progress Notes (Signed)
Patient ID: Gabrielle Yu, female   DOB: 06/13/1950, 67 y.o.   MRN: [id number]   Subjective:    Patient ID: Gabrielle Yu, female    DOB: August 02, 1950, 67 y.o.   MRN: [id number]  HPI  Patient with past history of diabetes, hypertension and hypercholesterolemia.  She was just evaluated by Leanora Cover for cough and congestion.  Placed on amoxicillin.  Given cough syrup.  She reports that symptoms have improved.  Still having increased cough.  Request tessalon perles.  Some increased drainage.  Sore throat is better.  No chest pain.  No sob.  No acid reflux.  No abdominal pain.  Bowels moving.  Discussed labs.  Discussed increased a1c.  Discussed diet and exercise and adjusting medications.  Taking jardiance.     Past Medical History:  Diagnosis Date  . Diabetes mellitus ([facility])   . High cholesterol   . Hypercholesterolemia   . Hypertension    Past Surgical History:  Procedure Laterality Date  . BREAST BIOPSY Left 2014   core with clip  . CATARACT EXTRACTION W/PHACO Right 01/24/2017   Procedure: CATARACT EXTRACTION PHACO AND INTRAOCULAR LENS PLACEMENT ([facility]) RIGHT DIABETIC COMPLICATED;  Surgeon: Lockie Mola, MD;  Location[facility];  Service: Ophthalmology;  Laterality: Right;  Diabetic - oral meds  . TONSILLECTOMY AND ADENOIDECTOMY  1958  . TUBAL LIGATION  1982   Family History  Problem Relation Age of Onset  . Breast cancer Mother 52  . Hypertension Mother   . Heart disease Father        myocardial infarction - died age 65  . Heart disease Unknown        paternal aunts and uncles  . Colon cancer Neg Hx    Social History   Socioeconomic History  . Marital status: Married    Spouse name: Not on file  . Number of children: 2  . Years of education: Not on file  . Highest education level: Not on file  Occupational History    Employer: [employer]  Social Needs  . Financial resource strain: Not on file  . Food insecurity:   Worry: Not on file    Inability: Not on file  . Transportation needs:    Medical: Not on file    Non-medical: Not on file  Tobacco Use  . Smoking status: Never Smoker  . Smokeless tobacco: Never Used  Substance and Sexual Activity  . Alcohol use: No    Alcohol/week: 0.0 standard drinks  . Drug use: No  . Sexual activity: Not on file  Lifestyle  . Physical activity:    Days per week: Not on file    Minutes per session: Not on file  . Stress: Not on file  Relationships  . Social connections:    Talks on phone: Not on file    Gets together: Not on file    Attends religious service: Not on file    Active member of club or organization: Not on file    Attends meetings of clubs or organizations: Not on file    Relationship status: Not on file  Other Topics Concern  . Not on file  Social History Narrative  . Not on file    Outpatient Encounter Medications as of 03/12/2018  Medication Sig  . amLODipine (NORVASC) 5 MG tablet TAKE 1 TABLET BY MOUTH DAILY  . amoxicillin (AMOXIL) 875 MG tablet Take 1 tablet (875 mg total) by mouth 2 (two) times daily.  Marland Kitchen  busPIRone (BUSPAR) 5 MG tablet TAKE 1 TABLET(5 MG) BY MOUTH DAILY  . guaiFENesin-codeine 100-10 MG/5ML syrup Take 10 mLs by mouth every 8 (eight) hours as needed for cough. Can cause drowsiness.  Marland Kitchen lisinopril (PRINIVIL,ZESTRIL) 40 MG tablet TAKE 1 TABLET BY MOUTH DAILY  . metFORMIN (GLUCOPHAGE) 500 MG tablet Take 2 tablets (1,000 mg total) by mouth 2 (two) times daily.  . rosuvastatin (CRESTOR) 10 MG tablet TAKE 1 TABLET BY MOUTH DAILY  . [DISCONTINUED] JARDIANCE 10 MG TABS tablet TAKE 1 TABLET BY MOUTH DAILY  . benzonatate (TESSALON) 100 MG capsule Take 1 capsule (100 mg total) by mouth 3 (three) times daily as needed for cough.  . empagliflozin (JARDIANCE) 25 MG TABS tablet Take 25 mg by mouth daily.   No facility-administered encounter medications on file as of 03/12/2018.     Review of Systems  Constitutional: Negative for  appetite change and unexpected weight change.  HENT: Positive for congestion and postnasal drip. Negative for sinus pressure.   Eyes: Positive for photophobia. Negative for pain and visual disturbance.  Respiratory: Negative for cough, chest tightness and shortness of breath.   Cardiovascular: Negative for chest pain, palpitations and leg swelling.  Gastrointestinal: Negative for abdominal pain, diarrhea, nausea and vomiting.  Genitourinary: Negative for difficulty urinating and dysuria.  Musculoskeletal: Negative for joint swelling and myalgias.  Skin: Negative for color change and rash.  Neurological: Negative for dizziness, light-headedness and headaches.  Hematological: Negative for adenopathy. Does not bruise/bleed easily.  Psychiatric/Behavioral: Negative for agitation and dysphoric mood.       Objective:    Physical Exam  Constitutional: She is oriented to person, place, and time. She appears well-developed and well-nourished. No distress.  HENT:  Nose: Nose normal.  Mouth/Throat: Oropharynx is clear and moist.  Eyes: Right eye exhibits no discharge. Left eye exhibits no discharge. No scleral icterus.  Neck: Neck supple. No thyromegaly present.  Cardiovascular: Normal rate and regular rhythm.  Pulmonary/Chest: Breath sounds normal. No accessory muscle usage. No tachypnea. No respiratory distress. She has no decreased breath sounds. She has no wheezes. She has no rhonchi. Right breast exhibits no inverted nipple, no mass, no nipple discharge and no tenderness (no axillary adenopathy). Left breast exhibits no inverted nipple, no mass, no nipple discharge and no tenderness (no axilarry adenopathy).  Abdominal: Soft. Bowel sounds are normal. There is no tenderness.  Musculoskeletal: She exhibits no edema or tenderness.  Lymphadenopathy:    She has no cervical adenopathy.  Neurological: She is alert and oriented to person, place, and time.  Skin: No rash noted. No erythema.    Psychiatric: She has a normal mood and affect. Her behavior is normal.    BP (!) 142/80 (BP Location: Left Arm, Patient Position: Sitting, Cuff Size: Normal)   Pulse 86   Temp 98 F (36.7 C) (Oral)   Resp 18   Wt 161 lb 6.4 oz (73.2 kg)   SpO2 97%   BMI 27.70 kg/m  Wt Readings from Last 3 Encounters:  03/12/18 161 lb 6.4 oz (73.2 kg)  03/07/18 160 lb 9.6 oz (72.8 kg)  11/27/17 158 lb (71.7 kg)     Lab Results  Component Value Date   WBC 6.3 03/11/2018   HGB 12.1 03/11/2018   HCT 36.5 03/11/2018   PLT 276.0 03/11/2018   GLUCOSE 141 (H) 03/11/2018   CHOL 158 03/11/2018   TRIG 145.0 03/11/2018   HDL 35.90 (L) 03/11/2018   LDLDIRECT 157.1 01/21/2013   LDLCALC 93  03/11/2018   ALT 68 (H) 03/11/2018   AST 49 (H) 03/11/2018   NA 141 03/11/2018   K 4.0 03/11/2018   CL 105 03/11/2018   CREATININE 0.80 03/11/2018   BUN 9 03/11/2018   CO2 28 03/11/2018   TSH 2.94 06/22/2017   HGBA1C 8.0 (H) 03/11/2018   MICROALBUR 3.9 (H) 03/11/2018    Mm 3d Screen Breast Bilateral  Result Date: 12/03/2017 CLINICAL DATA:  Screening. EXAM: DIGITAL SCREENING BILATERAL MAMMOGRAM WITH TOMO AND CAD COMPARISON:  Previous exam(s). ACR Breast Density Category c: The breast tissue is heterogeneously dense, which may obscure small masses. FINDINGS: There are no findings suspicious for malignancy. Images were processed with CAD. IMPRESSION: No mammographic evidence of malignancy. A result letter of this screening mammogram will be mailed directly to the patient. RECOMMENDATION: Screening mammogram in one year. (Code:SM-B-01Y) BI-RADS CATEGORY  1: Negative. Electronically Signed   By: Ted Mcalpineobrinka  Dimitrova M.D.   On: 12/03/2017 19:03       Assessment & Plan:   Problem List Items Addressed This Visit    Abnormal liver function    Previous abdominal ultrasound revealed increased liver echogenicity.  Have discussed f/u ultrasound and f/u with GI.  She declines.  Discussed diet, exercise and weight loss.   Follow liver function tests.        Relevant Orders   Hepatic function panel   Diabetes mellitus (HCC)    Discussed diet and exercise.  Discussed elevated a1c.  Increase jardiance to 25mg  q day.  Continue metformin.  Follow sugars.        Relevant Medications   empagliflozin (JARDIANCE) 25 MG TABS tablet   Health care maintenance    Physical today 03/12/18.  Mammogram 12/03/17 - birads I.  She declines colonoscopy.        Hypercholesteremia    On crestor.  Low cholesterol diet and exercise.  Follow lipid panel and liver function tests.        Hypertension    Blood pressure has been doing relatively well.  Feel elevated related to increased cough/congestion, etc.  Treat infection.  Follow pressures.  Follow metabolic panel.        Stress    buspar working well.  Follow.         Other Visit Diagnoses    Sinusitis, unspecified chronicity, unspecified location    -  Primary   Complete amoxicillin. Nasacort nasal spray.  Robitussin.  Tessalon Perles.  Follow.     Relevant Medications   benzonatate (TESSALON) 100 MG capsule       Dale Durhamharlene Arneshia Ade, MD

## 2018-03-12 NOTE — Patient Instructions (Addendum)
nasacort nasal spray - 2 sprays each nostril one time per day.  Do this in the evening.    Saline nasal spray - flush nose at least 2-3x/day 

## 2018-03-17 ENCOUNTER — Encounter: Payer: Self-pay | Admitting: Internal Medicine

## 2018-03-17 NOTE — Assessment & Plan Note (Signed)
Blood pressure has been doing relatively well.  Feel elevated related to increased cough/congestion, etc.  Treat infection.  Follow pressures.  Follow metabolic panel.

## 2018-03-17 NOTE — Assessment & Plan Note (Signed)
Previous abdominal ultrasound revealed increased liver echogenicity.  Have discussed f/u ultrasound and f/u with GI.  She declines.  Discussed diet, exercise and weight loss.  Follow liver function tests.

## 2018-03-17 NOTE — Assessment & Plan Note (Signed)
Discussed diet and exercise.  Discussed elevated a1c.  Increase jardiance to 25mg  q day.  Continue metformin.  Follow sugars.

## 2018-03-17 NOTE — Assessment & Plan Note (Signed)
On crestor.  Low cholesterol diet and exercise.  Follow lipid panel and liver function tests.   

## 2018-03-17 NOTE — Assessment & Plan Note (Signed)
buspar working well  Follow.   

## 2018-03-22 ENCOUNTER — Other Ambulatory Visit: Payer: Self-pay

## 2018-03-22 NOTE — Patient Outreach (Signed)
[facility] Care Management  03/22/2018  Lanny HurstBrenda Madren Yu 1950/05/23 [id number]   Medication Adherence call to Mrs. Gabrielle CruiseBrenda Yu left a message for patient to call back patient is due on Lisinopril 40 mg and Rosuvastatin 10 mg. Gabrielle Yu is showing past due under [facility].   Lillia AbedAna Ollison-Moran CPhT Pharmacy Technician [facility] Care Management Direct Dial [phone]  Fax [phone] [email or web address]

## 2018-03-26 ENCOUNTER — Other Ambulatory Visit: Payer: Self-pay | Admitting: Internal Medicine

## 2018-04-02 ENCOUNTER — Other Ambulatory Visit: Payer: Self-pay | Admitting: Internal Medicine

## 2018-04-02 ENCOUNTER — Other Ambulatory Visit (INDEPENDENT_AMBULATORY_CARE_PROVIDER_SITE_OTHER): Payer: Medicare Other

## 2018-04-02 DIAGNOSIS — R945 Abnormal results of liver function studies: Secondary | ICD-10-CM

## 2018-04-02 LAB — HEPATIC FUNCTION PANEL
ALBUMIN: 4.3 g/dL (ref 3.5–5.2)
ALT: 45 U/L — ABNORMAL HIGH (ref 0–35)
AST: 34 U/L (ref 0–37)
Alkaline Phosphatase: 62 U/L (ref 39–117)
Bilirubin, Direct: 0.1 mg/dL (ref 0.0–0.3)
TOTAL PROTEIN: 6.9 g/dL (ref 6.0–8.3)
Total Bilirubin: 0.4 mg/dL (ref 0.2–1.2)

## 2018-05-10 ENCOUNTER — Ambulatory Visit: Payer: Medicare Other | Admitting: Internal Medicine

## 2018-05-30 ENCOUNTER — Other Ambulatory Visit: Payer: Self-pay | Admitting: Internal Medicine

## 2018-05-30 MED ORDER — METFORMIN HCL 500 MG PO TABS: 1000.0000 mg | ORAL_TABLET | Freq: Two times a day (BID) | ORAL | 2 refills | Status: DC

## 2018-05-30 NOTE — Telephone Encounter (Signed)
Copied from CRM [id number]. Topic: Quick Communication - Rx Refill/Question >> May 30, 2018  8:56 AM Jolayne Haines L wrote: Medication: metFORMIN (GLUCOPHAGE) 500 MG tablet ( 90 day supply )  Has the patient contacted their pharmacy? Yes several times , she is out of the medication and has been for a couple weeks. (Agent: If no, request that the patient contact the pharmacy for the refill.) (Agent: If yes, when and what did the pharmacy advise?)  Preferred Pharmacy (with phone number or street name): [facility] #[id number] [place], [place] - [address] AT [facility] [address] [place] [address]    Agent: Please be advised that RX refills may take up to 3 business days. We ask that you follow-up with your pharmacy.

## 2018-06-04 ENCOUNTER — Other Ambulatory Visit: Payer: Self-pay | Admitting: Internal Medicine

## 2018-06-12 ENCOUNTER — Other Ambulatory Visit: Payer: Self-pay | Admitting: Internal Medicine

## 2018-06-12 MED ORDER — BUSPIRONE HCL 5 MG PO TABS: ORAL_TABLET | ORAL | 0 refills | Status: DC

## 2018-06-12 NOTE — Telephone Encounter (Signed)
Copied from CRM [id number]. Topic: Quick Communication - Rx Refill/Question >> Jun 12, 2018  9:44 AM Lynne Logan D wrote: Medication: busPIRone (BUSPAR) 5 MG tablet / Pt stated pharmacy has sent over several refill requests with no results. Please advise.  Has the patient contacted their pharmacy? Yes.   (Agent: If no, request that the patient contact the pharmacy for the refill.) (Agent: If yes, when and what did the pharmacy advise?)  Preferred Pharmacy (with phone number or street name): [facility] #[id number] [place] Rough,  - [address] AT [facility] OF [address] [phone] (Phone) [phone] (Fax)  Agent: Please be advised that RX refills may take up to 3 business days. We ask that you follow-up with your pharmacy.

## 2018-07-12 ENCOUNTER — Other Ambulatory Visit: Payer: Self-pay

## 2018-07-12 MED ORDER — BUSPIRONE HCL 5 MG PO TABS: ORAL_TABLET | ORAL | 2 refills | Status: DC

## 2018-07-24 ENCOUNTER — Other Ambulatory Visit: Payer: Self-pay | Admitting: Internal Medicine

## 2018-09-30 ENCOUNTER — Other Ambulatory Visit: Payer: Self-pay

## 2018-10-02 ENCOUNTER — Other Ambulatory Visit: Payer: Self-pay

## 2018-10-02 ENCOUNTER — Ambulatory Visit (INDEPENDENT_AMBULATORY_CARE_PROVIDER_SITE_OTHER): Payer: Medicare Other | Admitting: Internal Medicine

## 2018-10-02 VITALS — BP 134/78 | HR 85 | Temp 98.0°F | Resp 16 | Ht 64.0 in | Wt 157.0 lb

## 2018-10-02 DIAGNOSIS — E119 Type 2 diabetes mellitus without complications: Secondary | ICD-10-CM

## 2018-10-02 DIAGNOSIS — Z1231 Encounter for screening mammogram for malignant neoplasm of breast: Secondary | ICD-10-CM | POA: Diagnosis not present

## 2018-10-02 DIAGNOSIS — R945 Abnormal results of liver function studies: Secondary | ICD-10-CM

## 2018-10-02 DIAGNOSIS — E7800 Pure hypercholesterolemia, unspecified: Secondary | ICD-10-CM

## 2018-10-02 DIAGNOSIS — I10 Essential (primary) hypertension: Secondary | ICD-10-CM

## 2018-10-02 DIAGNOSIS — F439 Reaction to severe stress, unspecified: Secondary | ICD-10-CM

## 2018-10-02 LAB — HEMOGLOBIN A1C: Hgb A1c MFr Bld: 6.9 % — ABNORMAL HIGH (ref 4.6–6.5)

## 2018-10-02 LAB — LIPID PANEL
Cholesterol: 150 mg/dL (ref 0–200)
HDL: 49.9 mg/dL (ref >39.00)
LDL Cholesterol: 84 mg/dL (ref 0–99)
NonHDL: 100.53
Total CHOL/HDL Ratio: 3
Triglycerides: 85 mg/dL (ref 0.0–149.0)
VLDL: 17 mg/dL (ref 0.0–40.0)

## 2018-10-02 LAB — BASIC METABOLIC PANEL
BUN: 13 mg/dL (ref 6–23)
CO2: 29 mEq/L (ref 19–32)
Calcium: 9.4 mg/dL (ref 8.4–10.5)
Chloride: 104 mEq/L (ref 96–112)
Creatinine, Ser: 0.9 mg/dL (ref 0.40–1.20)
GFR: 62.21 mL/min (ref >60.00)
Glucose, Bld: 102 mg/dL — ABNORMAL HIGH (ref 70–99)
Potassium: 4.5 mEq/L (ref 3.5–5.1)
Sodium: 140 mEq/L (ref 135–145)

## 2018-10-02 LAB — HEPATIC FUNCTION PANEL
ALT: 29 U/L (ref 0–35)
AST: 23 U/L (ref 0–37)
Albumin: 4.6 g/dL (ref 3.5–5.2)
Alkaline Phosphatase: 62 U/L (ref 39–117)
Bilirubin, Direct: 0.1 mg/dL (ref 0.0–0.3)
Total Bilirubin: 0.5 mg/dL (ref 0.2–1.2)
Total Protein: 6.8 g/dL (ref 6.0–8.3)

## 2018-10-02 LAB — TSH: TSH: 3.61 u[IU]/mL (ref 0.35–4.50)

## 2018-10-02 MED ORDER — LISINOPRIL 40 MG PO TABS: 40.0000 mg | ORAL_TABLET | Freq: Every day | ORAL | 1 refills | Status: DC

## 2018-10-02 MED ORDER — AMLODIPINE BESYLATE 5 MG PO TABS: 5.0000 mg | ORAL_TABLET | Freq: Every day | ORAL | 1 refills | Status: DC

## 2018-10-02 MED ORDER — ROSUVASTATIN CALCIUM 10 MG PO TABS: 10.0000 mg | ORAL_TABLET | Freq: Every day | ORAL | 0 refills | Status: DC

## 2018-10-02 MED ORDER — METFORMIN HCL 500 MG PO TABS: 1000.0000 mg | ORAL_TABLET | Freq: Two times a day (BID) | ORAL | 1 refills | Status: DC

## 2018-10-02 MED ORDER — BUSPIRONE HCL 5 MG PO TABS: ORAL_TABLET | ORAL | 1 refills | Status: DC

## 2018-10-02 MED ORDER — EMPAGLIFLOZIN 25 MG PO TABS: 25.0000 mg | ORAL_TABLET | Freq: Every day | ORAL | 1 refills | Status: DC

## 2018-10-02 NOTE — Progress Notes (Signed)
Patient ID: Gabrielle Yu, female   DOB: [birth year], 68 y.o.   MRN: [id number]   Subjective:    Patient ID: Gabrielle Yu, female    DOB: 1950-05-20, 68 y.o.   MRN: [id number]  HPI  Patient here for follow up.  She feels good.  Is doing well. Feels better.  Trying to watch her diet.  Exercising.  Sugars are better.  No low sugars.  States am sugar 80s.  Highest reading in pm - 140.  No chest pain.  No sob.  No acid reflux.  No abdominal pain.  Bowels moving.  Discussed the need for colon cancer screening.  Declines colonoscopy and cologuard.  Taking metformin 548m - 2 in am and 1 in pm.  Doing well with this dosing regimen.  Taking buspar.  Helping with anxiety.  Husband doing well.  Overall feels good.     Past Medical History:  Diagnosis Date   [place]    High cholesterol    Hypercholesterolemia    Hypertension    Past Surgical History:  Procedure Laterality Date   BREAST BIOPSY Left 2014   core with clip   CATARACT EXTRACTION W/PHACO Right 01/24/2017   Procedure: CATARACT [place]TRA[place] PHAC[place];  Surgeon: BLeandrew Koyanagi MD;  [facility]  Service: Ophthalmology;  Laterality: Right;  Diabetic - oral meds   TONSILLECTOMY AND ADENOIDECTOMY  1958   TUBAL LIGATION  1982   Family History  Problem Relation Age of Onset   Breast cancer Mother 795  Hypertension Mother    Heart disease Father        myocardial infarction - died age 68  Heart disease Other        paternal aunts and uncles   Colon cancer Neg Hx    Social History   Socioeconomic History   Marital status: Married    Spouse name: Not on file   Number of children: 2   Years of education: Not on file   Highest education level: Not on file  Occupational History    [place] Not on file   Food insecurity    Worry:  Not on file    Inability: Not on file   Transportation needs    Medical: Not on file    Non-medical: Not on file  Tobacco Use   Smoking status: Never Smoker   Smokeless tobacco: Never Used  Substance and Sexual Activity   Alcohol use: No    Alcohol/week: 0.0 standard drinks   Drug use: No   Sexual activity: Not on file  Lifestyle   Physical activity    Days per week: Not on file    Minutes per session: Not on file   Stress: Not on file  Relationships   Social connections    Talks on phone: Not on file    Gets together: Not on file    Attends religious service: Not on file    Active member of club or organization: Not on file    Attends meetings of clubs or organizations: Not on file    Relationship status: Not on file  Other Topics Concern   Not on file  Social History Narrative   Not on file    Outpatient Encounter Medications as of 10/02/2018  Medication Sig   amLODipine (NORVASC) 5 MG tablet Take 1 tablet (5 mg total) by  mouth daily.   busPIRone (BUSPAR) 5 MG tablet TAKE 1 TABLET(5 MG) BY MOUTH DAILY   empagliflozin (JARDIANCE) 25 MG TABS tablet Take 25 mg by mouth daily.   lisinopril (ZESTRIL) 40 MG tablet Take 1 tablet (40 mg total) by mouth daily.   metFORMIN (GLUCOPHAGE) 500 MG tablet Take 2 tablets (1,000 mg total) by mouth 2 (two) times daily.   rosuvastatin (CRESTOR) 10 MG tablet Take 1 tablet (10 mg total) by mouth daily.   [DISCONTINUED] amLODipine (NORVASC) 5 MG tablet TAKE 1 TABLET BY MOUTH DAILY   [DISCONTINUED] amoxicillin (AMOXIL) 875 MG tablet Take 1 tablet (875 mg total) by mouth 2 (two) times daily.   [DISCONTINUED] benzonatate (TESSALON) 100 MG capsule Take 1 capsule (100 mg total) by mouth 3 (three) times daily as needed for cough.   [DISCONTINUED] busPIRone (BUSPAR) 5 MG tablet TAKE 1 TABLET(5 MG) BY MOUTH DAILY   [DISCONTINUED] guaiFENesin-codeine 100-10 MG/5ML syrup Take 10 mLs by mouth every 8 (eight) hours as needed for  cough. Can cause drowsiness.   [DISCONTINUED] JARDIANCE 25 MG TABS tablet TAKE 1 TABLET BY MOUTH DAILY   [DISCONTINUED] lisinopril (PRINIVIL,ZESTRIL) 40 MG tablet TAKE 1 TABLET BY MOUTH DAILY   [DISCONTINUED] metFORMIN (GLUCOPHAGE) 500 MG tablet Take 2 tablets (1,000 mg total) by mouth 2 (two) times daily.   [DISCONTINUED] rosuvastatin (CRESTOR) 10 MG tablet TAKE 1 TABLET BY MOUTH DAILY   No facility-administered encounter medications on file as of 10/02/2018.     Review of Systems  Constitutional: Negative for appetite change and unexpected weight change.  HENT: Negative for congestion and sinus pressure.   Eyes: Negative for pain and visual disturbance.  Respiratory: Negative for cough, chest tightness and shortness of breath.   Cardiovascular: Negative for chest pain, palpitations and leg swelling.  Gastrointestinal: Negative for abdominal pain, diarrhea, nausea and vomiting.  Genitourinary: Negative for difficulty urinating and dysuria.  Musculoskeletal: Negative for joint swelling and myalgias.  Skin: Negative for color change and rash.  Neurological: Negative for dizziness, light-headedness and headaches.  Hematological: Negative for adenopathy. Does not bruise/bleed easily.  Psychiatric/Behavioral: Negative for agitation and dysphoric mood.       Objective:    Physical Exam Constitutional:      General: She is not in acute distress.    Appearance: Normal appearance.  HENT:     Right Ear: External ear normal.     Left Ear: External ear normal.  Eyes:     General:        Right eye: No discharge.        Left eye: No discharge.     Conjunctiva/sclera: Conjunctivae normal.  Neck:     Musculoskeletal: Neck supple. No muscular tenderness.     Thyroid: No thyromegaly.  Cardiovascular:     Rate and Rhythm: Normal rate and regular rhythm.  Pulmonary:     Effort: No respiratory distress.     Breath sounds: Normal breath sounds. No wheezing.  Abdominal:     General:  Bowel sounds are normal.     Palpations: Abdomen is soft.     Tenderness: There is no abdominal tenderness.  Musculoskeletal:        General: No swelling or tenderness.  Lymphadenopathy:     Cervical: No cervical adenopathy.  Skin:    Findings: No erythema or rash.  Neurological:     Mental Status: She is alert.  Psychiatric:        Mood and Affect: Mood normal.  Behavior: Behavior normal.     BP 134/78    Pulse 85    Temp 98 F (36.7 C) (Oral)    Resp 16    Ht 5' 4" (1.626 m)    Wt 157 lb (71.2 kg)    SpO2 99%    BMI 26.95 kg/m  Wt Readings from Last 3 Encounters:  10/02/18 157 lb (71.2 kg)  03/12/18 161 lb 6.4 oz (73.2 kg)  03/07/18 160 lb 9.6 oz (72.8 kg)     Lab Results  Component Value Date   WBC 6.3 03/11/2018   HGB 12.1 03/11/2018   HCT 36.5 03/11/2018   PLT 276.0 03/11/2018   GLUCOSE 102 (H) 10/02/2018   CHOL 150 10/02/2018   TRIG 85.0 10/02/2018   HDL 49.90 10/02/2018   LDLDIRECT 157.1 01/21/2013   LDLCALC 84 10/02/2018   ALT 29 10/02/2018   AST 23 10/02/2018   NA 140 10/02/2018   K 4.5 10/02/2018   CL 104 10/02/2018   CREATININE 0.90 10/02/2018   BUN 13 10/02/2018   CO2 29 10/02/2018   TSH 3.61 10/02/2018   HGBA1C 6.9 (H) 10/02/2018   MICROALBUR 3.9 (H) 03/11/2018    Mm 3d Screen Breast Bilateral  Result Date: 12/03/2017 CLINICAL DATA:  Screening. EXAM: DIGITAL SCREENING BILATERAL MAMMOGRAM WITH TOMO AND CAD COMPARISON:  Previous exam(s). ACR Breast Density Category c: The breast tissue is heterogeneously dense, which may obscure small masses. FINDINGS: There are no findings suspicious for malignancy. Images were processed with CAD. IMPRESSION: No mammographic evidence of malignancy. A result letter of this screening mammogram will be mailed directly to the patient. RECOMMENDATION: Screening mammogram in one year. (Code:SM-B-01Y) BI-RADS CATEGORY  1: Negative. Electronically Signed   By: Fidela Salisbury M.D.   On: 12/03/2017 19:03         Assessment & Plan:   Problem List Items Addressed This Visit    Abnormal liver function    She has adjusted her diet.  Sugars improved.  Previous abdominal ultrasound revealed increased liver echogenicity.  Again discussed f/u ultrasound.  She declines.  Continue diet, exercise and weight loss.        Relevant Orders   Hepatic function panel (Completed)   Diabetes mellitus ([place])    Continue low carb diet and exercise.  Sugars doing better.  Follow met b and a1c.        Relevant Medications   empagliflozin (JARDIANCE) 25 MG TABS tablet   lisinopril (ZESTRIL) 40 MG tablet   rosuvastatin (CRESTOR) 10 MG tablet   metFORMIN (GLUCOPHAGE) 500 MG tablet   Other Relevant Orders   Hemoglobin A1c (Completed)   Hypercholesteremia    On crestor.  Low cholesterol diet and exercise.  Follow met b and a1c.        Relevant Medications   amLODipine (NORVASC) 5 MG tablet   lisinopril (ZESTRIL) 40 MG tablet   rosuvastatin (CRESTOR) 10 MG tablet   Other Relevant Orders   Lipid panel (Completed)   Hypertension    Blood pressure under good control.  Continue same medication regimen.  Follow pressures.  Follow metabolic panel.        Relevant Medications   amLODipine (NORVASC) 5 MG tablet   lisinopril (ZESTRIL) 40 MG tablet   rosuvastatin (CRESTOR) 10 MG tablet   Other Relevant Orders   TSH (Completed)   Basic metabolic panel (Completed)   Stress    buspar working well  Follow.         Other  Visit Diagnoses    Visit for screening mammogram    -  Primary   Relevant Orders   MM 3D SCREEN BREAST BILATERAL       Einar Pheasant, MD

## 2018-10-06 ENCOUNTER — Encounter: Payer: Self-pay | Admitting: Internal Medicine

## 2018-10-06 NOTE — Assessment & Plan Note (Signed)
Blood pressure under good control.  Continue same medication regimen.  Follow pressures.  Follow metabolic panel.   

## 2018-10-06 NOTE — Assessment & Plan Note (Signed)
Continue low carb diet and exercise.  Sugars doing better.  Follow met b and a1c.

## 2018-10-06 NOTE — Assessment & Plan Note (Signed)
She has adjusted her diet.  Sugars improved.  Previous abdominal ultrasound revealed increased liver echogenicity.  Again discussed f/u ultrasound.  She declines.  Continue diet, exercise and weight loss.

## 2018-10-06 NOTE — Assessment & Plan Note (Signed)
On crestor.  Low cholesterol diet and exercise.  Follow met b and a1c.   

## 2018-10-06 NOTE — Assessment & Plan Note (Signed)
buspar working well  Follow.

## 2018-12-11 ENCOUNTER — Other Ambulatory Visit: Payer: Self-pay

## 2018-12-11 ENCOUNTER — Ambulatory Visit
Admission: RE | Admit: 2018-12-11 | Discharge: 2018-12-11 | Disposition: A | Discharge status: Home / Self Care | Payer: Medicare Other | Source: Ambulatory Visit | Attending: Internal Medicine | Admitting: Internal Medicine

## 2018-12-11 DIAGNOSIS — Z1231 Encounter for screening mammogram for malignant neoplasm of breast: Secondary | ICD-10-CM | POA: Diagnosis not present

## 2018-12-11 LAB — HM MAMMOGRAPHY

## 2019-01-27 ENCOUNTER — Ambulatory Visit (INDEPENDENT_AMBULATORY_CARE_PROVIDER_SITE_OTHER): Payer: Medicare Other

## 2019-01-27 ENCOUNTER — Other Ambulatory Visit: Payer: Self-pay

## 2019-01-27 DIAGNOSIS — Z23 Encounter for immunization: Secondary | ICD-10-CM

## 2019-02-19 ENCOUNTER — Other Ambulatory Visit: Payer: Self-pay

## 2019-02-19 ENCOUNTER — Encounter: Payer: Self-pay | Admitting: Internal Medicine

## 2019-02-19 ENCOUNTER — Ambulatory Visit (INDEPENDENT_AMBULATORY_CARE_PROVIDER_SITE_OTHER): Payer: Medicare Other | Admitting: Internal Medicine

## 2019-02-19 VITALS — BP 118/70 | HR 86 | Temp 97.4°F | Resp 16 | Wt 150.8 lb

## 2019-02-19 DIAGNOSIS — E119 Type 2 diabetes mellitus without complications: Secondary | ICD-10-CM | POA: Diagnosis not present

## 2019-02-19 DIAGNOSIS — E7800 Pure hypercholesterolemia, unspecified: Secondary | ICD-10-CM | POA: Diagnosis not present

## 2019-02-19 DIAGNOSIS — I10 Essential (primary) hypertension: Secondary | ICD-10-CM | POA: Diagnosis not present

## 2019-02-19 DIAGNOSIS — R945 Abnormal results of liver function studies: Secondary | ICD-10-CM | POA: Diagnosis not present

## 2019-02-19 DIAGNOSIS — Z23 Encounter for immunization: Secondary | ICD-10-CM

## 2019-02-19 LAB — HM DIABETES FOOT EXAM

## 2019-02-19 NOTE — Progress Notes (Signed)
Patient ID: Gabrielle Yu, female   DOB: 10-24-1950, 68 y.o.   MRN: [id number]   Subjective:    Patient ID: Gabrielle Yu, female    DOB: [birth year], 68 y.o.   MRN: [id number]  HPI  Patient here for a scheduled follow up.  She reports she is doing well.  Has adjusted her diet.  Not snacking as much.  Lost weight.  Sugars doing well.  States averaging 120s in the am.  Trying to stay active. No chest pain. No sob.  No acid reflux. No abdominal pain.  Bowels moving.  Discussed the need for colon cancer screening.  Discussed colonoscopy and cologuard. She declines. Discussed importance of early detection. She declines.     Past Medical History:  Diagnosis Date  . Diabetes mellitus ([place])   . High cholesterol   . Hypercholesterolemia   . Hypertension    Past Surgical History:  Procedure Laterality Date  . BREAST BIOPSY Left 2014   core with clip  . CATARACT EXTRACTION W/PHACO Right 01/24/2017   Procedure: CATARACT EXTRACTION PHACO AND INTRAOCULAR LENS PLACEMENT ([place]) RIGHT DIABETIC COMPLICATED;  Surgeon: Leandrew Koyanagi, MD;  Location: [facility];  Service: Ophthalmology;  Laterality: Right;  Diabetic - oral meds  . TONSILLECTOMY AND ADENOIDECTOMY  1958  . TUBAL LIGATION  1982   Family History  Problem Relation Age of Onset  . Breast cancer Mother 87  . Hypertension Mother   . Heart disease Father        myocardial infarction - died age 66  . Heart disease Other        paternal aunts and uncles  . Colon cancer Neg Hx    Social History   Socioeconomic History  . Marital status: Married    Spouse name: Not on file  . Number of children: 2  . Years of education: Not on file  . Highest education level: Not on file  Occupational History    Employer: [employer]  Social Needs  . Financial resource strain: Not on file  . Food insecurity    Worry: Not on file    Inability: Not on file  . Transportation needs    Medical: Not on file     Non-medical: Not on file  Tobacco Use  . Smoking status: Never Smoker  . Smokeless tobacco: Never Used  Substance and Sexual Activity  . Alcohol use: No    Alcohol/week: 0.0 standard drinks  . Drug use: No  . Sexual activity: Not on file  Lifestyle  . Physical activity    Days per week: Not on file    Minutes per session: Not on file  . Stress: Not on file  Relationships  . Social Herbalist on phone: Not on file    Gets together: Not on file    Attends religious service: Not on file    Active member of club or organization: Not on file    Attends meetings of clubs or organizations: Not on file    Relationship status: Not on file  Other Topics Concern  . Not on file  Social History Narrative  . Not on file    Outpatient Encounter Medications as of 02/19/2019  Medication Sig  . amLODipine (NORVASC) 5 MG tablet Take 1 tablet (5 mg total) by mouth daily.  . busPIRone (BUSPAR) 5 MG tablet TAKE 1 TABLET(5 MG) BY MOUTH DAILY  . empagliflozin (JARDIANCE) 25 MG TABS tablet Take 25 mg by  mouth daily.  Marland Kitchen lisinopril (ZESTRIL) 40 MG tablet Take 1 tablet (40 mg total) by mouth daily.  . metFORMIN (GLUCOPHAGE) 500 MG tablet Take 2 tablets (1,000 mg total) by mouth 2 (two) times daily.  . rosuvastatin (CRESTOR) 10 MG tablet Take 1 tablet (10 mg total) by mouth daily.   No facility-administered encounter medications on file as of 02/19/2019.    Review of Systems  Constitutional: Negative for appetite change and unexpected weight change.  HENT: Negative for congestion and sinus pressure.   Respiratory: Negative for cough, chest tightness and shortness of breath.   Cardiovascular: Negative for chest pain, palpitations and leg swelling.  Gastrointestinal: Negative for abdominal pain, diarrhea, nausea and vomiting.  Genitourinary: Negative for difficulty urinating and dysuria.  Musculoskeletal: Negative for joint swelling and myalgias.  Skin: Negative for color change and  rash.  Neurological: Negative for dizziness, light-headedness and headaches.  Psychiatric/Behavioral: Negative for agitation and dysphoric mood.       Objective:    Physical Exam Constitutional:      General: She is not in acute distress.    Appearance: Normal appearance.  HENT:     Head: Normocephalic and atraumatic.     Right Ear: External ear normal.     Left Ear: External ear normal.  Eyes:     General: No scleral icterus.       Right eye: No discharge.        Left eye: No discharge.     Conjunctiva/sclera: Conjunctivae normal.  Neck:     Musculoskeletal: Neck supple. No muscular tenderness.     Thyroid: No thyromegaly.  Cardiovascular:     Rate and Rhythm: Normal rate and regular rhythm.  Pulmonary:     Effort: No respiratory distress.     Breath sounds: Normal breath sounds. No wheezing.  Abdominal:     General: Bowel sounds are normal.     Palpations: Abdomen is soft.     Tenderness: There is no abdominal tenderness.  Musculoskeletal:        General: No swelling or tenderness.  Lymphadenopathy:     Cervical: No cervical adenopathy.  Skin:    Findings: No erythema or rash.  Neurological:     Mental Status: She is alert.  Psychiatric:        Mood and Affect: Mood normal.        Behavior: Behavior normal.     BP 118/70   Pulse 86   Temp (!) 97.4 F (36.3 C)   Resp 16   Wt 150 lb 12.8 oz (68.4 kg)   SpO2 98%   BMI 25.88 kg/m  Wt Readings from Last 3 Encounters:  02/19/19 150 lb 12.8 oz (68.4 kg)  10/02/18 157 lb (71.2 kg)  03/12/18 161 lb 6.4 oz (73.2 kg)     Lab Results  Component Value Date   WBC 6.3 03/11/2018   HGB 12.1 03/11/2018   HCT 36.5 03/11/2018   PLT 276.0 03/11/2018   GLUCOSE 102 (H) 10/02/2018   CHOL 150 10/02/2018   TRIG 85.0 10/02/2018   HDL 49.90 10/02/2018   LDLDIRECT 157.1 01/21/2013   LDLCALC 84 10/02/2018   ALT 29 10/02/2018   AST 23 10/02/2018   NA 140 10/02/2018   K 4.5 10/02/2018   CL 104 10/02/2018    CREATININE 0.90 10/02/2018   BUN 13 10/02/2018   CO2 29 10/02/2018   TSH 3.61 10/02/2018   HGBA1C 6.9 (H) 10/02/2018   MICROALBUR 3.9 (H) 03/11/2018  Mm 3d Screen Breast Bilateral  Result Date: 12/11/2018 CLINICAL DATA:  Screening. EXAM: DIGITAL SCREENING BILATERAL MAMMOGRAM WITH TOMO AND CAD COMPARISON:  Previous exam(s). ACR Breast Density Category c: The breast tissue is heterogeneously dense, which may obscure small masses. FINDINGS: There are no findings suspicious for malignancy. Images were processed with CAD. IMPRESSION: No mammographic evidence of malignancy. A result letter of this screening mammogram will be mailed directly to the patient. RECOMMENDATION: Screening mammogram in one year. (Code:SM-B-01Y) BI-RADS CATEGORY  1: Negative. Electronically Signed   By: Ammie Ferrier M.D.   On: 12/11/2018 12:39       Assessment & Plan:   Problem List Items Addressed This Visit    Abnormal liver function    Has adjusted her diet.  Has lost weight.  Previous abdominal ultrasound revealed increased liver echogenicity.  Have discussed f/u ultrasound.  She has declined.  Continue diet, exercise and weight loss.        Relevant Orders   Hepatic function panel   Diabetes mellitus ([place])    Has adjusted diet, exercise and has lost weight.  Sugars improved.  Follow met b and a1c.       Relevant Orders   Hemoglobin J2Q   Basic metabolic panel   Microalbumin / creatinine urine ratio   Hypercholesteremia    On crestor.  Low cholesterol diet and exercise.  Follow lipid panel and liver function tests.        Relevant Orders   Lipid panel   Hypertension    Blood pressure under good control.  Continue same medication regimen.  Follow pressures.  Follow metabolic panel.        Relevant Orders   CBC with Differential/Platelet    Other Visit Diagnoses    Need for vaccination with 13-polyvalent pneumococcal conjugate vaccine    -  Primary   Relevant Orders   Pneumococcal conjugate  vaccine 13-valent (Completed)       Einar Pheasant, MD

## 2019-02-23 NOTE — Assessment & Plan Note (Signed)
Has adjusted her diet.  Has lost weight.  Previous abdominal ultrasound revealed increased liver echogenicity.  Have discussed f/u ultrasound.  She has declined.  Continue diet, exercise and weight loss.

## 2019-02-23 NOTE — Assessment & Plan Note (Signed)
Blood pressure under good control.  Continue same medication regimen.  Follow pressures.  Follow metabolic panel.   

## 2019-02-23 NOTE — Assessment & Plan Note (Signed)
Has adjusted diet, exercise and has lost weight.  Sugars improved.  Follow met b and a1c.

## 2019-02-23 NOTE — Assessment & Plan Note (Signed)
On crestor.  Low cholesterol diet and exercise.  Follow lipid panel and liver function tests.   

## 2019-03-03 ENCOUNTER — Other Ambulatory Visit: Payer: Self-pay | Admitting: Internal Medicine

## 2019-03-03 MED ORDER — JARDIANCE 25 MG PO TABS: 25.0000 mg | ORAL_TABLET | Freq: Every day | ORAL | 1 refills | Status: DC

## 2019-03-03 MED ORDER — ROSUVASTATIN CALCIUM 10 MG PO TABS: 10.0000 mg | ORAL_TABLET | Freq: Every day | ORAL | 0 refills | Status: DC

## 2019-03-03 NOTE — Telephone Encounter (Signed)
Medication Refill - Medication: empagliflozin (JARDIANCE) 25 MG TABS tablet  rosuvastatin (CRESTOR) 10 MG tablet    Has the patient contacted their pharmacy? Yes.   (Agent: If no, request that the patient contact the pharmacy for the refill.) (Agent: If yes, when and what did the pharmacy advise?)  Preferred Pharmacy (with phone number or street name): [id number] - [place], [place]: Please be advised that RX refills may take up to 3 business days. We ask that you follow-up with your pharmacy.

## 2019-03-11 ENCOUNTER — Other Ambulatory Visit: Payer: Self-pay

## 2019-03-13 ENCOUNTER — Other Ambulatory Visit (INDEPENDENT_AMBULATORY_CARE_PROVIDER_SITE_OTHER): Payer: Medicare Other

## 2019-03-13 ENCOUNTER — Other Ambulatory Visit: Payer: Self-pay

## 2019-03-13 DIAGNOSIS — E7800 Pure hypercholesterolemia, unspecified: Secondary | ICD-10-CM

## 2019-03-13 DIAGNOSIS — I10 Essential (primary) hypertension: Secondary | ICD-10-CM

## 2019-03-13 DIAGNOSIS — R945 Abnormal results of liver function studies: Secondary | ICD-10-CM | POA: Diagnosis not present

## 2019-03-13 DIAGNOSIS — E119 Type 2 diabetes mellitus without complications: Secondary | ICD-10-CM

## 2019-03-13 LAB — MICROALBUMIN / CREATININE URINE RATIO
Creatinine,U: 104.1 mg/dL
Microalb Creat Ratio: 0.9 mg/g (ref 0.0–30.0)
Microalb, Ur: 1 mg/dL (ref 0.0–1.9)

## 2019-03-13 LAB — HEPATIC FUNCTION PANEL
ALT: 22 U/L (ref 0–35)
AST: 19 U/L (ref 0–37)
Albumin: 4.4 g/dL (ref 3.5–5.2)
Alkaline Phosphatase: 53 U/L (ref 39–117)
Bilirubin, Direct: 0.1 mg/dL (ref 0.0–0.3)
Total Bilirubin: 0.4 mg/dL (ref 0.2–1.2)
Total Protein: 6.9 g/dL (ref 6.0–8.3)

## 2019-03-13 LAB — LIPID PANEL
Cholesterol: 131 mg/dL (ref 0–200)
HDL: 43.5 mg/dL (ref >39.00)
LDL Cholesterol: 71 mg/dL (ref 0–99)
NonHDL: 87.59
Total CHOL/HDL Ratio: 3
Triglycerides: 83 mg/dL (ref 0.0–149.0)
VLDL: 16.6 mg/dL (ref 0.0–40.0)

## 2019-03-13 LAB — BASIC METABOLIC PANEL
BUN: 13 mg/dL (ref 6–23)
CO2: 28 mEq/L (ref 19–32)
Calcium: 9.1 mg/dL (ref 8.4–10.5)
Chloride: 105 mEq/L (ref 96–112)
Creatinine, Ser: 0.86 mg/dL (ref 0.40–1.20)
GFR: 65.47 mL/min (ref >60.00)
Glucose, Bld: 102 mg/dL — ABNORMAL HIGH (ref 70–99)
Potassium: 3.9 mEq/L (ref 3.5–5.1)
Sodium: 141 mEq/L (ref 135–145)

## 2019-03-14 ENCOUNTER — Encounter: Payer: Self-pay | Admitting: Internal Medicine

## 2019-03-14 ENCOUNTER — Telehealth: Payer: Self-pay

## 2019-03-14 NOTE — Telephone Encounter (Signed)
Gabrielle Yu called from our lab stating they had received [name]'s lab work but did not receive the lavender top tube required to perform her A1C and CBC as ordered. Pt. Will need to RTC for another blood draw in order to have these tests run. Unsure why a lavender tube was not obtained, possibly labs added on after the fact.  I called the patient and told her we needed to have her come back in for another blood draw, to come at her leisure, and that there would be no charge.

## 2019-03-17 ENCOUNTER — Other Ambulatory Visit: Payer: Self-pay

## 2019-03-17 ENCOUNTER — Other Ambulatory Visit (INDEPENDENT_AMBULATORY_CARE_PROVIDER_SITE_OTHER): Payer: Medicare Other

## 2019-03-17 DIAGNOSIS — E119 Type 2 diabetes mellitus without complications: Secondary | ICD-10-CM

## 2019-03-17 DIAGNOSIS — F439 Reaction to severe stress, unspecified: Secondary | ICD-10-CM

## 2019-03-17 LAB — CBC WITH DIFFERENTIAL/PLATELET
Basophils Absolute: 0.1 10*3/uL (ref 0.0–0.1)
Basophils Relative: 0.9 % (ref 0.0–3.0)
Eosinophils Absolute: 0 10*3/uL (ref 0.0–0.7)
Eosinophils Relative: 0 % (ref 0.0–5.0)
HCT: 36.9 % (ref 36.0–46.0)
Hemoglobin: 12.4 g/dL (ref 12.0–15.0)
Lymphocytes Relative: 30.3 % (ref 12.0–46.0)
Lymphs Abs: 2 10*3/uL (ref 0.7–4.0)
MCHC: 33.5 g/dL (ref 30.0–36.0)
MCV: 88.1 fl (ref 78.0–100.0)
Monocytes Absolute: 0.6 10*3/uL (ref 0.1–1.0)
Monocytes Relative: 9.2 % (ref 3.0–12.0)
Neutro Abs: 3.9 10*3/uL (ref 1.4–7.7)
Neutrophils Relative %: 59.6 % (ref 43.0–77.0)
Platelets: 231 10*3/uL (ref 150.0–400.0)
RBC: 4.19 Mil/uL (ref 3.87–5.11)
RDW: 13.5 % (ref 11.5–15.5)
WBC: 6.6 10*3/uL (ref 4.0–10.5)

## 2019-03-17 LAB — HEMOGLOBIN A1C: Hgb A1c MFr Bld: 6.3 % (ref 4.6–6.5)

## 2019-03-17 NOTE — Telephone Encounter (Signed)
I was out of the office, it appears that this has already been taken care of.

## 2019-03-18 ENCOUNTER — Encounter: Payer: Self-pay | Admitting: Internal Medicine

## 2019-04-08 ENCOUNTER — Telehealth: Payer: Self-pay | Admitting: Internal Medicine

## 2019-04-08 ENCOUNTER — Other Ambulatory Visit: Payer: Self-pay

## 2019-04-08 MED ORDER — AMLODIPINE BESYLATE 5 MG PO TABS: 5.0000 mg | ORAL_TABLET | Freq: Every day | ORAL | 1 refills | Status: DC

## 2019-04-08 NOTE — Telephone Encounter (Signed)
Medication refilled. Pt aware

## 2019-04-08 NOTE — Telephone Encounter (Signed)
Pt needs a refill on her amLODipine (NORVASC) 5 MG tablet, pt only has 2 to 3 days remaining.

## 2019-05-15 ENCOUNTER — Telehealth: Payer: Self-pay | Admitting: Internal Medicine

## 2019-05-15 ENCOUNTER — Other Ambulatory Visit: Payer: Self-pay

## 2019-05-15 MED ORDER — BUSPIRONE HCL 5 MG PO TABS: ORAL_TABLET | ORAL | 1 refills | Status: DC

## 2019-05-15 NOTE — Telephone Encounter (Signed)
Rx refilled.

## 2019-05-15 NOTE — Telephone Encounter (Signed)
Pt needs a refill on busPIRone (BUSPAR) 5 MG tablet sent to [facility]

## 2019-05-23 ENCOUNTER — Other Ambulatory Visit: Payer: Self-pay | Admitting: Lab

## 2019-05-23 ENCOUNTER — Telehealth: Payer: Self-pay | Admitting: Internal Medicine

## 2019-05-23 MED ORDER — LISINOPRIL 40 MG PO TABS: 40.0000 mg | ORAL_TABLET | Freq: Every day | ORAL | 1 refills | Status: DC

## 2019-05-23 MED ORDER — ROSUVASTATIN CALCIUM 10 MG PO TABS: 10.0000 mg | ORAL_TABLET | Freq: Every day | ORAL | 0 refills | Status: DC

## 2019-05-23 NOTE — Telephone Encounter (Signed)
Pt needs refill on  rosuvastatin (CRESTOR) 10 MG tablet and lisinopril (ZESTRIL) 40 MG tablet. [facility]

## 2019-05-23 NOTE — Telephone Encounter (Signed)
Refill sent into [facility] 

## 2019-05-24 ENCOUNTER — Ambulatory Visit: Payer: Medicare Other

## 2019-05-29 ENCOUNTER — Ambulatory Visit: Payer: Medicare Other | Attending: Internal Medicine

## 2019-05-29 DIAGNOSIS — Z23 Encounter for immunization: Secondary | ICD-10-CM | POA: Insufficient documentation

## 2019-05-29 NOTE — Progress Notes (Signed)
   Covid-19 Vaccination Clinic  Name:  Gabrielle Yu    MRN: [id number] DOB: 07/12/1950  05/29/2019  [name] was observed post Covid-19 immunization for 15 minutes without incidence. She was provided with Vaccine Information Sheet and instruction to access the V-Safe system.   [name] was instructed to call 911 with any severe reactions post vaccine: Marland Kitchen Difficulty breathing  . Swelling of your face and throat  . A fast heartbeat  . A bad rash all over your body  . Dizziness and weakness    Immunizations Administered    Name Date Dose VIS Date Route   Pfizer COVID-19 Vaccine 05/29/2019  8:29 AM 0.3 mL 04/04/2019 Intramuscular   Manufacturer: [employer], [employer]   Lot: [id number]   NDC: [address]-5

## 2019-06-04 ENCOUNTER — Ambulatory Visit: Payer: Medicare Other

## 2019-06-23 ENCOUNTER — Ambulatory Visit: Payer: Medicare Other | Attending: Internal Medicine

## 2019-06-23 DIAGNOSIS — Z23 Encounter for immunization: Secondary | ICD-10-CM

## 2019-06-23 NOTE — Progress Notes (Signed)
   Covid-19 Vaccination Clinic  Name:  Gabrielle Yu    MRN: [id number] DOB: 10-30-1950  06/23/2019  [name] was observed post Covid-19 immunization for 15 minutes without incidence. She was provided with Vaccine Information Sheet and instruction to access the V-Safe system.   [name] was instructed to call 911 with any severe reactions post vaccine: Marland Kitchen Difficulty breathing  . Swelling of your face and throat  . A fast heartbeat  . A bad rash all over your body  . Dizziness and weakness    Immunizations Administered    Name Date Dose VIS Date Route   Pfizer COVID-19 Vaccine 06/23/2019  8:26 AM 0.3 mL 04/04/2019 Intramuscular   Manufacturer: [employer], [employer]   Lot: [id number]   NDC: [address]-2

## 2019-06-27 ENCOUNTER — Telehealth: Payer: Self-pay | Admitting: Internal Medicine

## 2019-06-27 MED ORDER — METFORMIN HCL 500 MG PO TABS: 1000.0000 mg | ORAL_TABLET | Freq: Two times a day (BID) | ORAL | 1 refills | Status: DC

## 2019-06-27 NOTE — Telephone Encounter (Signed)
Pt needs a refill on metFORMIN (GLUCOPHAGE) 500 MG tablet sent to [facility]

## 2019-07-28 ENCOUNTER — Other Ambulatory Visit: Payer: Self-pay

## 2019-07-28 ENCOUNTER — Encounter: Payer: Self-pay | Admitting: Internal Medicine

## 2019-07-28 ENCOUNTER — Ambulatory Visit (INDEPENDENT_AMBULATORY_CARE_PROVIDER_SITE_OTHER): Payer: Medicare Other | Admitting: Internal Medicine

## 2019-07-28 VITALS — BP 122/74 | HR 92 | Temp 96.9°F | Resp 16 | Ht 64.0 in | Wt 153.6 lb

## 2019-07-28 DIAGNOSIS — I10 Essential (primary) hypertension: Secondary | ICD-10-CM | POA: Diagnosis not present

## 2019-07-28 DIAGNOSIS — R945 Abnormal results of liver function studies: Secondary | ICD-10-CM | POA: Diagnosis not present

## 2019-07-28 DIAGNOSIS — E7800 Pure hypercholesterolemia, unspecified: Secondary | ICD-10-CM

## 2019-07-28 DIAGNOSIS — Z0000 Encounter for general adult medical examination without abnormal findings: Secondary | ICD-10-CM

## 2019-07-28 DIAGNOSIS — E119 Type 2 diabetes mellitus without complications: Secondary | ICD-10-CM

## 2019-07-28 DIAGNOSIS — F439 Reaction to severe stress, unspecified: Secondary | ICD-10-CM

## 2019-07-28 LAB — BASIC METABOLIC PANEL
BUN: 10 mg/dL (ref 6–23)
CO2: 28 mEq/L (ref 19–32)
Calcium: 9 mg/dL (ref 8.4–10.5)
Chloride: 106 mEq/L (ref 96–112)
Creatinine, Ser: 0.83 mg/dL (ref 0.40–1.20)
GFR: 68.13 mL/min (ref >60.00)
Glucose, Bld: 107 mg/dL — ABNORMAL HIGH (ref 70–99)
Potassium: 4.1 mEq/L (ref 3.5–5.1)
Sodium: 142 mEq/L (ref 135–145)

## 2019-07-28 LAB — HEPATIC FUNCTION PANEL
ALT: 29 U/L (ref 0–35)
AST: 21 U/L (ref 0–37)
Albumin: 4.4 g/dL (ref 3.5–5.2)
Alkaline Phosphatase: 58 U/L (ref 39–117)
Bilirubin, Direct: 0.1 mg/dL (ref 0.0–0.3)
Total Bilirubin: 0.4 mg/dL (ref 0.2–1.2)
Total Protein: 6.7 g/dL (ref 6.0–8.3)

## 2019-07-28 LAB — LIPID PANEL
Cholesterol: 136 mg/dL (ref 0–200)
HDL: 51.4 mg/dL (ref >39.00)
LDL Cholesterol: 69 mg/dL (ref 0–99)
NonHDL: 85.01
Total CHOL/HDL Ratio: 3
Triglycerides: 79 mg/dL (ref 0.0–149.0)
VLDL: 15.8 mg/dL (ref 0.0–40.0)

## 2019-07-28 LAB — HEMOGLOBIN A1C: Hgb A1c MFr Bld: 6.6 % — ABNORMAL HIGH (ref 4.6–6.5)

## 2019-07-28 MED ORDER — BUSPIRONE HCL 5 MG PO TABS: ORAL_TABLET | ORAL | 3 refills | Status: DC

## 2019-07-28 MED ORDER — METFORMIN HCL 500 MG PO TABS: ORAL_TABLET | ORAL | 3 refills | Status: DC

## 2019-07-28 MED ORDER — ROSUVASTATIN CALCIUM 10 MG PO TABS: 10.0000 mg | ORAL_TABLET | Freq: Every day | ORAL | 3 refills | Status: DC

## 2019-07-28 MED ORDER — LISINOPRIL 40 MG PO TABS: 40.0000 mg | ORAL_TABLET | Freq: Every day | ORAL | 3 refills | Status: DC

## 2019-07-28 MED ORDER — JARDIANCE 25 MG PO TABS: 25.0000 mg | ORAL_TABLET | Freq: Every day | ORAL | 3 refills | Status: DC

## 2019-07-28 MED ORDER — AMLODIPINE BESYLATE 5 MG PO TABS: 5.0000 mg | ORAL_TABLET | Freq: Every day | ORAL | 3 refills | Status: DC

## 2019-07-28 NOTE — Assessment & Plan Note (Signed)
Physical today 07/28/19.  Mammogram 12/11/18 - birads I.  Declines colonoscopy.

## 2019-07-28 NOTE — Progress Notes (Signed)
Patient ID: Gabrielle Yu, female   DOB: [birth year], 69 y.o.   MRN: [id number]   Subjective:    Patient ID: Gabrielle Yu, female    DOB: 04-15-1951, 69 y.o.   MRN: [id number]  HPI This visit occurred during the SARS-CoV-2 public health emergency.  Safety protocols were in place, including screening questions prior to the visit, additional usage of staff PPE, and extensive cleaning of exam room while observing appropriate contact time as indicated for disinfecting solutions.  Patient with past history of hypertension, hypercholesterolemia and diabetes.  She comes in today to follow up on these issues as well as for a complete physical exam.  She reports she is doing well.  Not working.  Stress is better. Sleeping better.  No chest pain or sob.  Does not exercise regularly. Does try to watch her diet.  Fasting sugars averaging around 112.  PM sugars <115. No abdominal pain.  Bowels moving.  Discussed colon cancer screening.  She declines.     Past Medical History:  Diagnosis Date  . Diabetes mellitus ([place])   . High cholesterol   . Hypercholesterolemia   . Hypertension    Past Surgical History:  Procedure Laterality Date  . BREAST BIOPSY Left 2014   core with clip  . CATARACT EXTRACTION W/PHACO Right 01/24/2017   Procedure: CATARACT EXTRACTION PHACO AND INTRAOCULAR LENS PLACEMENT ([place]) RIGHT DIABETIC COMPLICATED;  Surgeon: Leandrew Koyanagi, MD;  Location: [place];  Service: Ophthalmology;  Laterality: Right;  Diabetic - oral meds  . TONSILLECTOMY AND ADENOIDECTOMY  1958  . TUBAL LIGATION  1982   Family History  Problem Relation Age of Onset  . Breast cancer Mother 89  . Hypertension Mother   . Heart disease Father        myocardial infarction - died age 81  . Heart disease Other        paternal aunts and uncles  . Colon cancer Neg Hx    Social History   Socioeconomic History  . Marital status: Married    Spouse name: Not on file  . Number of children:  2  . Years of education: Not on file  . Highest education level: Not on file  Occupational History    Employer: [employer]  Tobacco Use  . Smoking status: Never Smoker  . Smokeless tobacco: Never Used  Substance and Sexual Activity  . Alcohol use: No    Alcohol/week: 0.0 standard drinks  . Drug use: No  . Sexual activity: Not on file  Other Topics Concern  . Not on file  Social History Narrative  . Not on file   Social Determinants of Health   Financial Resource Strain:   . Difficulty of Paying Living Expenses:   Food Insecurity:   . Worried About Charity fundraiser in the Last Year:   . Arboriculturist in the Last Year:   Transportation Needs:   . Film/video editor (Medical):   Marland Kitchen Lack of Transportation (Non-Medical):   Physical Activity:   . Days of Exercise per Week:   . Minutes of Exercise per Session:   Stress:   . Feeling of Stress :   Social Connections:   . Frequency of Communication with Friends and Family:   . Frequency of Social Gatherings with Friends and Family:   . Attends Religious Services:   . Active Member of Clubs or Organizations:   . Attends Archivist Meetings:   Marland Kitchen Marital  Status:     Outpatient Encounter Medications as of 07/28/2019  Medication Sig  . amLODipine (NORVASC) 5 MG tablet Take 1 tablet (5 mg total) by mouth daily.  . busPIRone (BUSPAR) 5 MG tablet TAKE 1 TABLET(5 MG) BY MOUTH DAILY  . empagliflozin (JARDIANCE) 25 MG TABS tablet Take 25 mg by mouth daily.  Marland Kitchen lisinopril (ZESTRIL) 40 MG tablet Take 1 tablet (40 mg total) by mouth daily.  . metFORMIN (GLUCOPHAGE) 500 MG tablet Take 1 tablet by mouth every morning and two tablets by mouth every evening.  . rosuvastatin (CRESTOR) 10 MG tablet Take 1 tablet (10 mg total) by mouth daily.  . [DISCONTINUED] amLODipine (NORVASC) 5 MG tablet Take 1 tablet (5 mg total) by mouth daily.  . [DISCONTINUED] busPIRone (BUSPAR) 5 MG tablet TAKE 1 TABLET(5 MG) BY  MOUTH DAILY  . [DISCONTINUED] empagliflozin (JARDIANCE) 25 MG TABS tablet Take 25 mg by mouth daily.  . [DISCONTINUED] lisinopril (ZESTRIL) 40 MG tablet Take 1 tablet (40 mg total) by mouth daily.  . [DISCONTINUED] metFORMIN (GLUCOPHAGE) 500 MG tablet Take 2 tablets (1,000 mg total) by mouth 2 (two) times daily.  . [DISCONTINUED] rosuvastatin (CRESTOR) 10 MG tablet Take 1 tablet (10 mg total) by mouth daily.   No facility-administered encounter medications on file as of 07/28/2019.    Review of Systems  Constitutional: Negative for appetite change and unexpected weight change.  HENT: Negative for congestion and sinus pressure.   Eyes: Negative for pain and visual disturbance.  Respiratory: Negative for cough, chest tightness and shortness of breath.   Cardiovascular: Negative for chest pain, palpitations and leg swelling.  Gastrointestinal: Negative for abdominal pain, diarrhea, nausea and vomiting.  Genitourinary: Negative for difficulty urinating and dysuria.  Musculoskeletal: Negative for joint swelling and myalgias.  Skin: Negative for color change and rash.  Neurological: Negative for dizziness, light-headedness and headaches.  Hematological: Negative for adenopathy. Does not bruise/bleed easily.  Psychiatric/Behavioral: Negative for agitation and dysphoric mood.       Objective:    Physical Exam Vitals reviewed.  Constitutional:      General: She is not in acute distress.    Appearance: Normal appearance. She is well-developed.  HENT:     Head: Normocephalic and atraumatic.     Right Ear: External ear normal.     Left Ear: External ear normal.  Eyes:     General: No scleral icterus.       Right eye: No discharge.        Left eye: No discharge.     Conjunctiva/sclera: Conjunctivae normal.  Neck:     Thyroid: No thyromegaly.  Cardiovascular:     Rate and Rhythm: Normal rate and regular rhythm.  Pulmonary:     Effort: No tachypnea, accessory muscle usage or respiratory  distress.     Breath sounds: Normal breath sounds. No decreased breath sounds or wheezing.  Chest:     Breasts:        Right: No inverted nipple, mass, nipple discharge or tenderness (no axillary adenopathy).        Left: No inverted nipple, mass, nipple discharge or tenderness (no axilarry adenopathy).  Abdominal:     General: Bowel sounds are normal.     Palpations: Abdomen is soft.     Tenderness: There is no abdominal tenderness.  Musculoskeletal:        General: No swelling or tenderness.     Cervical back: Neck supple. No tenderness.  Lymphadenopathy:     Cervical:  No cervical adenopathy.  Skin:    Findings: No erythema or rash.  Neurological:     Mental Status: She is alert and oriented to person, place, and time.  Psychiatric:        Mood and Affect: Mood normal.        Behavior: Behavior normal.     BP 122/74   Pulse 92   Temp (!) 96.9 F (36.1 C)   Resp 16   Wt 153 lb 9.6 oz (69.7 kg)   SpO2 98%   BMI 26.37 kg/m  Wt Readings from Last 3 Encounters:  07/28/19 153 lb 9.6 oz (69.7 kg)  02/19/19 150 lb 12.8 oz (68.4 kg)  10/02/18 157 lb (71.2 kg)     Lab Results  Component Value Date   WBC 6.6 03/17/2019   HGB 12.4 03/17/2019   HCT 36.9 03/17/2019   PLT 231.0 03/17/2019   GLUCOSE 107 (H) 07/28/2019   CHOL 136 07/28/2019   TRIG 79.0 07/28/2019   HDL 51.40 07/28/2019   LDLDIRECT 157.1 01/21/2013   LDLCALC 69 07/28/2019   ALT 29 07/28/2019   AST 21 07/28/2019   NA 142 07/28/2019   K 4.1 07/28/2019   CL 106 07/28/2019   CREATININE 0.83 07/28/2019   BUN 10 07/28/2019   CO2 28 07/28/2019   TSH 3.61 10/02/2018   HGBA1C 6.6 (H) 07/28/2019   MICROALBUR 1.0 03/13/2019    MM 3D SCREEN BREAST BILATERAL  Result Date: 12/11/2018 CLINICAL DATA:  Screening. EXAM: DIGITAL SCREENING BILATERAL MAMMOGRAM WITH TOMO AND CAD COMPARISON:  Previous exam(s). ACR Breast Density Category c: The breast tissue is heterogeneously dense, which may obscure small masses.  FINDINGS: There are no findings suspicious for malignancy. Images were processed with CAD. IMPRESSION: No mammographic evidence of malignancy. A result letter of this screening mammogram will be mailed directly to the patient. RECOMMENDATION: Screening mammogram in one year. (Code:SM-B-01Y) BI-RADS CATEGORY  1: Negative. Electronically Signed   By: Ammie Ferrier M.D.   On: 12/11/2018 12:39       Assessment & Plan:   Problem List Items Addressed This Visit    Abnormal liver function    Previous abdominal ultrasound revealed increased liver echogenicity.  Continue diet and exercise.  Follow liver function tests.       Relevant Orders   Hepatic function panel (Completed)   Diabetes mellitus ([place]) - Primary    Low carb diet and exercise.  Taking metformin and jardiance.  Follow met b and a1c.       Relevant Medications   metFORMIN (GLUCOPHAGE) 500 MG tablet   empagliflozin (JARDIANCE) 25 MG TABS tablet   lisinopril (ZESTRIL) 40 MG tablet   rosuvastatin (CRESTOR) 10 MG tablet   Other Relevant Orders   Hemoglobin A1c (Completed)   Basic metabolic panel (Completed)   Health care maintenance    Physical today 07/28/19.  Mammogram 12/11/18 - birads I.  Declines colonoscopy.        Hypercholesteremia    On crestor.  Low cholesterol diet and exercise.  Follow lipid panel and liver function tests.        Relevant Medications   amLODipine (NORVASC) 5 MG tablet   lisinopril (ZESTRIL) 40 MG tablet   rosuvastatin (CRESTOR) 10 MG tablet   Other Relevant Orders   Lipid panel (Completed)   Hypertension    Blood pressures doing well.  On lisinopril and amlodipine.  Follow pressures.  Follow metabolic panel.       Relevant Medications  amLODipine (NORVASC) 5 MG tablet   lisinopril (ZESTRIL) 40 MG tablet   rosuvastatin (CRESTOR) 10 MG tablet   Stress    Stress is better.  Has buspar.  Follow.           Einar Pheasant, MD

## 2019-08-03 ENCOUNTER — Encounter: Payer: Self-pay | Admitting: Internal Medicine

## 2019-08-03 NOTE — Assessment & Plan Note (Signed)
On crestor.  Low cholesterol diet and exercise.  Follow lipid panel and liver function tests.   

## 2019-08-03 NOTE — Assessment & Plan Note (Addendum)
Low carb diet and exercise.  Taking metformin and jardiance.  Follow met b and a1c.

## 2019-08-03 NOTE — Assessment & Plan Note (Signed)
Stress is better.  Has buspar.  Follow.

## 2019-08-03 NOTE — Assessment & Plan Note (Signed)
Blood pressures doing well.  On lisinopril and amlodipine.  Follow pressures.  Follow metabolic panel.

## 2019-08-03 NOTE — Assessment & Plan Note (Signed)
Previous abdominal ultrasound revealed increased liver echogenicity.  Continue diet and exercise.  Follow liver function tests.

## 2019-12-03 ENCOUNTER — Telehealth: Payer: Medicare Other | Admitting: Internal Medicine

## 2019-12-13 ENCOUNTER — Other Ambulatory Visit: Payer: Self-pay | Admitting: Internal Medicine

## 2019-12-22 ENCOUNTER — Telehealth: Payer: Medicare Other | Admitting: Internal Medicine

## 2020-01-01 ENCOUNTER — Telehealth: Payer: Medicare Other | Admitting: Internal Medicine

## 2020-01-01 DIAGNOSIS — E119 Type 2 diabetes mellitus without complications: Secondary | ICD-10-CM

## 2020-01-01 NOTE — Progress Notes (Signed)
Patient ID: Gabrielle Yu, female   DOB: [birth year], 69 y.o.   MRN: [id number]   Virtual Visit via video Note Was scheduled for a video visit.  Nurse connected in with her.  Informed we were running behind due to emergency. Pt preferred to reschedule.  Was not seen.      Dale [place], MD

## 2020-01-11 ENCOUNTER — Encounter: Payer: Self-pay | Admitting: Internal Medicine

## 2020-01-12 ENCOUNTER — Other Ambulatory Visit: Payer: Self-pay | Admitting: Internal Medicine

## 2020-01-12 DIAGNOSIS — Z1231 Encounter for screening mammogram for malignant neoplasm of breast: Secondary | ICD-10-CM

## 2020-01-20 ENCOUNTER — Telehealth: Payer: Self-pay | Admitting: *Deleted

## 2020-01-20 DIAGNOSIS — E7800 Pure hypercholesterolemia, unspecified: Secondary | ICD-10-CM

## 2020-01-20 DIAGNOSIS — I10 Essential (primary) hypertension: Secondary | ICD-10-CM

## 2020-01-20 DIAGNOSIS — E119 Type 2 diabetes mellitus without complications: Secondary | ICD-10-CM

## 2020-01-20 NOTE — Telephone Encounter (Signed)
Please place future orders for lab appt.  

## 2020-01-21 ENCOUNTER — Other Ambulatory Visit (INDEPENDENT_AMBULATORY_CARE_PROVIDER_SITE_OTHER): Payer: Medicare Other

## 2020-01-21 ENCOUNTER — Other Ambulatory Visit: Payer: Self-pay

## 2020-01-21 DIAGNOSIS — E7800 Pure hypercholesterolemia, unspecified: Secondary | ICD-10-CM

## 2020-01-21 DIAGNOSIS — I10 Essential (primary) hypertension: Secondary | ICD-10-CM

## 2020-01-21 DIAGNOSIS — E119 Type 2 diabetes mellitus without complications: Secondary | ICD-10-CM | POA: Diagnosis not present

## 2020-01-21 DIAGNOSIS — Z23 Encounter for immunization: Secondary | ICD-10-CM

## 2020-01-21 LAB — BASIC METABOLIC PANEL
BUN: 12 mg/dL (ref 6–23)
CO2: 29 mEq/L (ref 19–32)
Calcium: 9.1 mg/dL (ref 8.4–10.5)
Chloride: 105 mEq/L (ref 96–112)
Creatinine, Ser: 0.81 mg/dL (ref 0.40–1.20)
GFR: 69.98 mL/min (ref >60.00)
Glucose, Bld: 87 mg/dL (ref 70–99)
Potassium: 4.4 mEq/L (ref 3.5–5.1)
Sodium: 141 mEq/L (ref 135–145)

## 2020-01-21 LAB — LIPID PANEL
Cholesterol: 157 mg/dL (ref 0–200)
HDL: 49.9 mg/dL (ref >39.00)
LDL Cholesterol: 78 mg/dL (ref 0–99)
NonHDL: 107.05
Total CHOL/HDL Ratio: 3
Triglycerides: 145 mg/dL (ref 0.0–149.0)
VLDL: 29 mg/dL (ref 0.0–40.0)

## 2020-01-21 LAB — HEPATIC FUNCTION PANEL
ALT: 41 U/L — ABNORMAL HIGH (ref 0–35)
AST: 34 U/L (ref 0–37)
Albumin: 4.3 g/dL (ref 3.5–5.2)
Alkaline Phosphatase: 59 U/L (ref 39–117)
Bilirubin, Direct: 0.1 mg/dL (ref 0.0–0.3)
Total Bilirubin: 0.5 mg/dL (ref 0.2–1.2)
Total Protein: 6.6 g/dL (ref 6.0–8.3)

## 2020-01-21 LAB — HEMOGLOBIN A1C: Hgb A1c MFr Bld: 6.8 % — ABNORMAL HIGH (ref 4.6–6.5)

## 2020-01-21 LAB — TSH: TSH: 4.65 u[IU]/mL — ABNORMAL HIGH (ref 0.35–4.50)

## 2020-01-21 NOTE — Telephone Encounter (Signed)
Orders placed for labs

## 2020-01-23 ENCOUNTER — Telehealth (INDEPENDENT_AMBULATORY_CARE_PROVIDER_SITE_OTHER): Payer: Medicare Other | Admitting: Internal Medicine

## 2020-01-23 ENCOUNTER — Other Ambulatory Visit: Payer: Self-pay

## 2020-01-23 DIAGNOSIS — E1165 Type 2 diabetes mellitus with hyperglycemia: Secondary | ICD-10-CM

## 2020-01-23 DIAGNOSIS — R945 Abnormal results of liver function studies: Secondary | ICD-10-CM

## 2020-01-23 DIAGNOSIS — Z20822 Contact with and (suspected) exposure to covid-19: Secondary | ICD-10-CM

## 2020-01-23 DIAGNOSIS — E7800 Pure hypercholesterolemia, unspecified: Secondary | ICD-10-CM

## 2020-01-23 DIAGNOSIS — F439 Reaction to severe stress, unspecified: Secondary | ICD-10-CM

## 2020-01-23 DIAGNOSIS — I10 Essential (primary) hypertension: Secondary | ICD-10-CM

## 2020-01-23 DIAGNOSIS — R7989 Other specified abnormal findings of blood chemistry: Secondary | ICD-10-CM

## 2020-01-23 NOTE — Progress Notes (Deleted)
Patient ID: Gabrielle Yu, female   DOB: 1950/06/01, 69 y.o.   MRN: [id number]   Subjective:    Patient ID: Gabrielle Yu, female    DOB: July 29, 1950, 69 y.o.   MRN: [id number]  HPI  Patient here for ***  Past Medical History:  Diagnosis Date  . Diabetes mellitus ([facility])   . High cholesterol   . Hypercholesterolemia   . Hypertension    Past Surgical History:  Procedure Laterality Date  . BREAST BIOPSY Left 2014   core with clip  . CATARACT EXTRACTION W/PHACO Right 01/24/2017   Procedure: CATARACT EXTRACTION PHACO AND INTRAOCULAR LENS PLACEMENT ([facility]) RIGHT DIABETIC COMPLICATED;  Surgeon: Lockie Mola, MD;  Location: [facility];  Service: Ophthalmology;  Laterality: Right;  Diabetic - oral meds  . TONSILLECTOMY AND ADENOIDECTOMY  1958  . TUBAL LIGATION  1982   Family History  Problem Relation Age of Onset  . Breast cancer Mother 52  . Hypertension Mother   . Heart disease Father        myocardial infarction - died age 67  . Heart disease Other        paternal aunts and uncles  . Colon cancer Neg Hx    Social History   Socioeconomic History  . Marital status: Married    Spouse name: Not on file  . Number of children: 2  . Years of education: Not on file  . Highest education level: Not on file  Occupational History    Employer: [employer]  Tobacco Use  . Smoking status: Never Smoker  . Smokeless tobacco: Never Used  Vaping Use  . Vaping Use: Never used  Substance and Sexual Activity  . Alcohol use: No    Alcohol/week: 0.0 standard drinks  . Drug use: No  . Sexual activity: Not on file  Other Topics Concern  . Not on file  Social History Narrative  . Not on file   Social Determinants of Health   Financial Resource Strain:   . Difficulty of Paying Living Expenses: Not on file  Food Insecurity:   . Worried About Programme researcher, broadcasting/film/video in the Last Year: Not on file  . Ran Out of Food in the Last Year: Not on file    Transportation Needs:   . Lack of Transportation (Medical): Not on file  . Lack of Transportation (Non-Medical): Not on file  Physical Activity:   . Days of Exercise per Week: Not on file  . Minutes of Exercise per Session: Not on file  Stress:   . Feeling of Stress : Not on file  Social Connections:   . Frequency of Communication with Friends and Family: Not on file  . Frequency of Social Gatherings with Friends and Family: Not on file  . Attends Religious Services: Not on file  . Active Member of Clubs or Organizations: Not on file  . Attends Banker Meetings: Not on file  . Marital Status: Not on file     Review of Systems     Objective:    Physical Exam  There were no vitals taken for this visit. Wt Readings from Last 3 Encounters:  07/28/19 153 lb 9.6 oz (69.7 kg)  02/19/19 150 lb 12.8 oz (68.4 kg)  10/02/18 157 lb (71.2 kg)     Lab Results  Component Value Date   WBC 6.6 03/17/2019   HGB 12.4 03/17/2019   HCT 36.9 03/17/2019   PLT 231.0 03/17/2019   GLUCOSE  87 01/21/2020   CHOL 157 01/21/2020   TRIG 145.0 01/21/2020   HDL 49.90 01/21/2020   LDLDIRECT 157.1 01/21/2013   LDLCALC 78 01/21/2020   ALT 41 (H) 01/21/2020   AST 34 01/21/2020   NA 141 01/21/2020   K 4.4 01/21/2020   CL 105 01/21/2020   CREATININE 0.81 01/21/2020   BUN 12 01/21/2020   CO2 29 01/21/2020   TSH 4.65 (H) 01/21/2020   HGBA1C 6.8 (H) 01/21/2020   MICROALBUR 1.0 03/13/2019    MM 3D SCREEN BREAST BILATERAL  Result Date: 12/11/2018 CLINICAL DATA:  Screening. EXAM: DIGITAL SCREENING BILATERAL MAMMOGRAM WITH TOMO AND CAD COMPARISON:  Previous exam(s). ACR Breast Density Category c: The breast tissue is heterogeneously dense, which may obscure small masses. FINDINGS: There are no findings suspicious for malignancy. Images were processed with CAD. IMPRESSION: No mammographic evidence of malignancy. A result letter of this screening mammogram will be mailed directly to the  patient. RECOMMENDATION: Screening mammogram in one year. (Code:SM-B-01Y) BI-RADS CATEGORY  1: Negative. Electronically Signed   By: Frederico Hamman M.D.   On: 12/11/2018 12:39       Assessment & Plan:   Problem List Items Addressed This Visit    None       Dale Motley, MD

## 2020-01-28 ENCOUNTER — Other Ambulatory Visit: Payer: Self-pay

## 2020-01-28 ENCOUNTER — Ambulatory Visit
Admission: RE | Admit: 2020-01-28 | Discharge: 2020-01-28 | Disposition: A | Discharge status: Home / Self Care | Payer: Medicare Other | Source: Ambulatory Visit | Attending: Internal Medicine | Admitting: Internal Medicine

## 2020-01-28 DIAGNOSIS — Z1231 Encounter for screening mammogram for malignant neoplasm of breast: Secondary | ICD-10-CM | POA: Insufficient documentation

## 2020-01-31 ENCOUNTER — Telehealth: Payer: Self-pay | Admitting: Internal Medicine

## 2020-01-31 ENCOUNTER — Encounter: Payer: Self-pay | Admitting: Internal Medicine

## 2020-01-31 DIAGNOSIS — R7989 Other specified abnormal findings of blood chemistry: Secondary | ICD-10-CM | POA: Insufficient documentation

## 2020-01-31 NOTE — Telephone Encounter (Signed)
Gabrielle Yu is planning to travel 02/12/20.  She wants covid test prior to leaving.  Can we schedule her to come here to be covid tested.  Please let her know and let me know.

## 2020-01-31 NOTE — Assessment & Plan Note (Signed)
AM sugars as outlined.  Appear to be doing well.  Discussed diet and exercise.  Continue metformin and jardiance.  Follow met b and a1c.

## 2020-01-31 NOTE — Telephone Encounter (Signed)
Please schedule her for a 4 month f/u appt and schedule a non fasting lab appt (to recheck tsh) - in 6-8 weeks and schedule fasting lab appt 1-2 days before f/u appt.  Thanks

## 2020-01-31 NOTE — Telephone Encounter (Signed)
Opened in error

## 2020-01-31 NOTE — Progress Notes (Signed)
Patient ID: Trystan Eads, female   DOB: 12-08-1950, 69 y.o.   MRN: [id number]   Virtual Visit via video Note  This visit type was conducted due to national recommendations for restrictions regarding the COVID-19 pandemic (e.g. social distancing).  This format is felt to be most appropriate for this patient at this time.  All issues noted in this document were discussed and addressed.  No physical exam was performed (except for noted visual exam findings with Video Visits).   I connected with Adolphus Birchwood by a video enabled telemedicine application and verified that I am speaking with the correct person using two identifiers. Location patient: home Location provider: work Persons participating in the virtual visit: patient, provider  The limitations, risks, security and privacy concerns of performing an evaluation and management service by video and the availability of in person appointments have been discussed. It has also been discussed with the patient that there may be a patient responsible charge related to this service. The patient expressed understanding and agreed to proceed.   Reason for visit: scheduled follow up.    HPI: Follow up regarding her blood pressure, cholesterol and diabetes.  She reports she is doing relatively well.  Trying to stay active.  No chest pain or sob reported.  No abdominal pain or cramping reported. Discussed diet and exercise.  States am sugars averaging 90-118.  Scheduled for mammogram 01/28/20.  Is planning to travel 02/12/20.  Would like to be covid tested prior.     ROS: See pertinent positives and negatives per HPI.  Past Medical History:  Diagnosis Date  . Diabetes mellitus ([place])   . High cholesterol   . Hypercholesterolemia   . Hypertension     Past Surgical History:  Procedure Laterality Date  . BREAST BIOPSY Left 2014   core with clip  . CATARACT EXTRACTION W/PHACO Right 01/24/2017   Procedure: CATARACT EXTRACTION PHACO AND  INTRAOCULAR LENS PLACEMENT ([place]) RIGHT DIABETIC COMPLICATED;  Surgeon: Leandrew Koyanagi, MD;  Location: [place];  Service: Ophthalmology;  Laterality: Right;  Diabetic - oral meds  . TONSILLECTOMY AND ADENOIDECTOMY  1958  . TUBAL LIGATION  1982    Family History  Problem Relation Age of Onset  . Breast cancer Mother 49  . Hypertension Mother   . Heart disease Father        myocardial infarction - died age 68  . Heart disease Other        paternal aunts and uncles  . Colon cancer Neg Hx     SOCIAL HX: reviewed   Current Outpatient Medications:  .  amLODipine (NORVASC) 5 MG tablet, Take 1 tablet (5 mg total) by mouth daily., Disp: 90 tablet, Rfl: 3 .  busPIRone (BUSPAR) 5 MG tablet, TAKE 1 TABLET(5 MG) BY MOUTH DAILY, Disp: 90 tablet, Rfl: 3 .  empagliflozin (JARDIANCE) 25 MG TABS tablet, Take 25 mg by mouth daily., Disp: 90 tablet, Rfl: 3 .  lisinopril (ZESTRIL) 40 MG tablet, TAKE 1 TABLET(40 MG) BY MOUTH DAILY, Disp: 90 tablet, Rfl: 3 .  metFORMIN (GLUCOPHAGE) 500 MG tablet, Take 1 tablet by mouth every morning and two tablets by mouth every evening., Disp: 360 tablet, Rfl: 3 .  rosuvastatin (CRESTOR) 10 MG tablet, Take 1 tablet (10 mg total) by mouth daily., Disp: 90 tablet, Rfl: 3  EXAM:  GENERAL: alert, oriented, appears well and in no acute distress  HEENT: atraumatic, conjunttiva clear, no obvious abnormalities on inspection of external nose and ears  NECK:  normal movements of the head and neck  LUNGS: on inspection no signs of respiratory distress, breathing rate appears normal, no obvious gross SOB, gasping or wheezing  CV: no obvious cyanosis  PSYCH/NEURO: pleasant and cooperative, no obvious depression or anxiety, speech and thought processing grossly intact  ASSESSMENT AND PLAN:  Discussed the following assessment and plan:  Problem List Items Addressed This Visit    Stress    Overall appears to be handling things well.  Follow.         Hypertension    On lisinopril and amlodipine.  Continue current medication regimen.  Spot check pressure.  Follow metabolic panel.       Relevant Orders   TSH   Hypercholesteremia    On crestor.  Low cholesterol diet and exercise.  Follow lipid panel and liver function tests.        Elevated TSH    TSH slightly elevated on recent check.  Recheck tsh to confirm wnl.        Diabetes mellitus ([place])    AM sugars as outlined.  Appear to be doing well.  Discussed diet and exercise.  Continue metformin and jardiance.  Follow met b and a1c.       Abnormal liver function    Previous abdominal ultrasound revealed liver echogenicity.  Continue diet and exercise.  Follow liver function tests.           I discussed the assessment and treatment plan with the patient. The patient was provided an opportunity to ask questions and all were answered. The patient agreed with the plan and demonstrated an understanding of the instructions.   The patient was advised to call back or seek an in-person evaluation if the symptoms worsen or if the condition fails to improve as anticipated.    Einar Pheasant, MD

## 2020-01-31 NOTE — Assessment & Plan Note (Signed)
On crestor.  Low cholesterol diet and exercise.  Follow lipid panel and liver function tests.   

## 2020-01-31 NOTE — Assessment & Plan Note (Signed)
On lisinopril and amlodipine.  Continue current medication regimen.  Spot check pressure.  Follow metabolic panel.

## 2020-01-31 NOTE — Assessment & Plan Note (Signed)
Overall appears to be handling things well.  Follow.  ?

## 2020-01-31 NOTE — Assessment & Plan Note (Signed)
Previous abdominal ultrasound revealed liver echogenicity.  Continue diet and exercise.  Follow liver function tests.

## 2020-01-31 NOTE — Assessment & Plan Note (Signed)
TSH slightly elevated on recent check.  Recheck tsh to confirm wnl.

## 2020-02-02 NOTE — Telephone Encounter (Signed)
Yes if the COVID test was ordered in the virtual visit. Also see staff message.

## 2020-02-02 NOTE — Telephone Encounter (Signed)
Scheduled

## 2020-02-02 NOTE — Telephone Encounter (Signed)
Patient is going to come in on 02/04/20 at 2:30 for COVID test.

## 2020-02-02 NOTE — Telephone Encounter (Signed)
I can place the order in her chart.  Can you give me the specific order that I am supposed to use.  thanks

## 2020-02-02 NOTE — Telephone Encounter (Signed)
Just wanted to confirm with you that we can use her last visit as the virtual even though she is not coming in until next week to be COVID tested prior to going out of town.

## 2020-02-03 ENCOUNTER — Other Ambulatory Visit: Payer: Self-pay | Admitting: Internal Medicine

## 2020-02-03 NOTE — Addendum Note (Signed)
Addended by: Larry Sierras on: 02/03/2020 01:35 PM   Modules accepted: Orders

## 2020-02-03 NOTE — Telephone Encounter (Signed)
COVID test ordered.

## 2020-02-04 ENCOUNTER — Other Ambulatory Visit: Payer: Medicare Other

## 2020-02-04 DIAGNOSIS — Z20822 Contact with and (suspected) exposure to covid-19: Secondary | ICD-10-CM

## 2020-02-06 LAB — SARS-COV-2, NAA 2 DAY TAT

## 2020-02-06 LAB — NOVEL CORONAVIRUS, NAA: SARS-CoV-2, NAA: NOT DETECTED

## 2020-02-10 ENCOUNTER — Telehealth: Payer: Self-pay

## 2020-02-10 MED ORDER — METFORMIN HCL 500 MG PO TABS: ORAL_TABLET | ORAL | 3 refills | Status: DC

## 2020-02-10 NOTE — Telephone Encounter (Signed)
Pt needs her refill to go to [facility] [place]. She said she is no longer using [facility]. I removed them from her preferred pharmacy. Pt is also leaving this evening to go out of town and need the metformin before she leaves.

## 2020-03-15 ENCOUNTER — Other Ambulatory Visit (INDEPENDENT_AMBULATORY_CARE_PROVIDER_SITE_OTHER): Payer: Medicare Other

## 2020-03-15 ENCOUNTER — Other Ambulatory Visit: Payer: Self-pay

## 2020-03-15 ENCOUNTER — Other Ambulatory Visit: Payer: Medicare Other

## 2020-03-15 DIAGNOSIS — I10 Essential (primary) hypertension: Secondary | ICD-10-CM | POA: Diagnosis not present

## 2020-03-15 LAB — TSH: TSH: 4.12 u[IU]/mL (ref 0.35–4.50)

## 2020-03-22 ENCOUNTER — Telehealth: Payer: Self-pay | Admitting: Internal Medicine

## 2020-03-22 MED ORDER — ROSUVASTATIN CALCIUM 10 MG PO TABS
10.0000 mg | ORAL_TABLET | Freq: Every day | ORAL | 3 refills | Status: DC
Start: 2020-03-22 — End: 2021-03-14

## 2020-03-22 MED ORDER — AMLODIPINE BESYLATE 5 MG PO TABS
5.0000 mg | ORAL_TABLET | Freq: Every day | ORAL | 3 refills | Status: DC
Start: 2020-03-22 — End: 2021-03-14

## 2020-03-22 MED ORDER — LISINOPRIL 40 MG PO TABS
ORAL_TABLET | ORAL | 3 refills | Status: DC
Start: 2020-03-22 — End: 2021-03-14

## 2020-03-22 MED ORDER — EMPAGLIFLOZIN 25 MG PO TABS
25.0000 mg | ORAL_TABLET | Freq: Every day | ORAL | 3 refills | Status: DC
Start: 2020-03-22 — End: 2021-02-08

## 2020-03-22 NOTE — Telephone Encounter (Signed)
Patient called in for refill for :amLODipine (NORVASC) 5 MG tablet ,empagliflozin (JARDIANCE) 25 MG TABS tablet ,lisinopril (ZESTRIL) 40 MG tablet , rosuvastatin (CRESTOR) 10 MG tablet  90 day supply

## 2020-06-08 ENCOUNTER — Telehealth: Payer: Self-pay | Admitting: *Deleted

## 2020-06-08 ENCOUNTER — Other Ambulatory Visit: Payer: Medicare Other

## 2020-06-08 DIAGNOSIS — R945 Abnormal results of liver function studies: Secondary | ICD-10-CM

## 2020-06-08 DIAGNOSIS — E7800 Pure hypercholesterolemia, unspecified: Secondary | ICD-10-CM

## 2020-06-08 DIAGNOSIS — E1165 Type 2 diabetes mellitus with hyperglycemia: Secondary | ICD-10-CM

## 2020-06-08 DIAGNOSIS — I10 Essential (primary) hypertension: Secondary | ICD-10-CM

## 2020-06-08 NOTE — Telephone Encounter (Signed)
Please place future orders for lab appt.  

## 2020-06-08 NOTE — Telephone Encounter (Signed)
Orders placed for f/u labs.  

## 2020-06-09 ENCOUNTER — Other Ambulatory Visit (INDEPENDENT_AMBULATORY_CARE_PROVIDER_SITE_OTHER): Payer: Medicare Other

## 2020-06-09 ENCOUNTER — Other Ambulatory Visit: Payer: Self-pay

## 2020-06-09 DIAGNOSIS — E1165 Type 2 diabetes mellitus with hyperglycemia: Secondary | ICD-10-CM | POA: Diagnosis not present

## 2020-06-09 DIAGNOSIS — I10 Essential (primary) hypertension: Secondary | ICD-10-CM | POA: Diagnosis not present

## 2020-06-09 DIAGNOSIS — E7800 Pure hypercholesterolemia, unspecified: Secondary | ICD-10-CM | POA: Diagnosis not present

## 2020-06-09 LAB — BASIC METABOLIC PANEL
BUN: 16 mg/dL (ref 6–23)
CO2: 30 mEq/L (ref 19–32)
Calcium: 9.2 mg/dL (ref 8.4–10.5)
Chloride: 105 mEq/L (ref 96–112)
Creatinine, Ser: 0.83 mg/dL (ref 0.40–1.20)
GFR: 71.63 mL/min (ref >60.00)
Glucose, Bld: 106 mg/dL — ABNORMAL HIGH (ref 70–99)
Potassium: 4.3 mEq/L (ref 3.5–5.1)
Sodium: 142 mEq/L (ref 135–145)

## 2020-06-09 LAB — LIPID PANEL
Cholesterol: 140 mg/dL (ref 0–200)
HDL: 49.5 mg/dL (ref >39.00)
LDL Cholesterol: 75 mg/dL (ref 0–99)
NonHDL: 90.29
Total CHOL/HDL Ratio: 3
Triglycerides: 78 mg/dL (ref 0.0–149.0)
VLDL: 15.6 mg/dL (ref 0.0–40.0)

## 2020-06-09 LAB — MICROALBUMIN / CREATININE URINE RATIO
Creatinine,U: 84.6 mg/dL
Microalb Creat Ratio: 1.5 mg/g (ref 0.0–30.0)
Microalb, Ur: 1.2 mg/dL (ref 0.0–1.9)

## 2020-06-09 LAB — HEMOGLOBIN A1C: Hgb A1c MFr Bld: 6.8 % — ABNORMAL HIGH (ref 4.6–6.5)

## 2020-06-09 LAB — CBC WITH DIFFERENTIAL/PLATELET
Basophils Absolute: 0.1 10*3/uL (ref 0.0–0.1)
Basophils Relative: 1.1 % (ref 0.0–3.0)
Eosinophils Absolute: 0 10*3/uL (ref 0.0–0.7)
Eosinophils Relative: 0 % (ref 0.0–5.0)
HCT: 37.4 % (ref 36.0–46.0)
Hemoglobin: 12.3 g/dL (ref 12.0–15.0)
Lymphocytes Relative: 32.1 % (ref 12.0–46.0)
Lymphs Abs: 2.2 10*3/uL (ref 0.7–4.0)
MCHC: 33 g/dL (ref 30.0–36.0)
MCV: 86.6 fl (ref 78.0–100.0)
Monocytes Absolute: 0.6 10*3/uL (ref 0.1–1.0)
Monocytes Relative: 8.6 % (ref 3.0–12.0)
Neutro Abs: 4 10*3/uL (ref 1.4–7.7)
Neutrophils Relative %: 58.2 % (ref 43.0–77.0)
Platelets: 207 10*3/uL (ref 150.0–400.0)
RBC: 4.31 Mil/uL (ref 3.87–5.11)
RDW: 14.6 % (ref 11.5–15.5)
WBC: 6.9 10*3/uL (ref 4.0–10.5)

## 2020-06-09 LAB — HEPATIC FUNCTION PANEL
ALT: 35 U/L (ref 0–35)
AST: 26 U/L (ref 0–37)
Albumin: 4.4 g/dL (ref 3.5–5.2)
Alkaline Phosphatase: 58 U/L (ref 39–117)
Bilirubin, Direct: 0.1 mg/dL (ref 0.0–0.3)
Total Bilirubin: 0.4 mg/dL (ref 0.2–1.2)
Total Protein: 6.7 g/dL (ref 6.0–8.3)

## 2020-06-10 ENCOUNTER — Encounter: Payer: Self-pay | Admitting: Internal Medicine

## 2020-06-10 ENCOUNTER — Other Ambulatory Visit: Payer: Self-pay

## 2020-06-10 ENCOUNTER — Ambulatory Visit (INDEPENDENT_AMBULATORY_CARE_PROVIDER_SITE_OTHER): Payer: Medicare Other | Admitting: Internal Medicine

## 2020-06-10 VITALS — BP 130/70 | HR 86 | Temp 98.1°F | Resp 16 | Ht 64.0 in | Wt 157.0 lb

## 2020-06-10 DIAGNOSIS — E1165 Type 2 diabetes mellitus with hyperglycemia: Secondary | ICD-10-CM | POA: Diagnosis not present

## 2020-06-10 DIAGNOSIS — R21 Rash and other nonspecific skin eruption: Secondary | ICD-10-CM

## 2020-06-10 DIAGNOSIS — E7800 Pure hypercholesterolemia, unspecified: Secondary | ICD-10-CM

## 2020-06-10 DIAGNOSIS — Z23 Encounter for immunization: Secondary | ICD-10-CM | POA: Diagnosis not present

## 2020-06-10 DIAGNOSIS — R7989 Other specified abnormal findings of blood chemistry: Secondary | ICD-10-CM

## 2020-06-10 DIAGNOSIS — I10 Essential (primary) hypertension: Secondary | ICD-10-CM | POA: Diagnosis not present

## 2020-06-10 DIAGNOSIS — R945 Abnormal results of liver function studies: Secondary | ICD-10-CM

## 2020-06-10 LAB — HM DIABETES FOOT EXAM

## 2020-06-10 MED ORDER — MUPIROCIN 2 % EX OINT: 1.0000 "application " | TOPICAL_OINTMENT | Freq: Two times a day (BID) | CUTANEOUS | 0 refills | Status: DC

## 2020-06-10 MED ORDER — METRONIDAZOLE 1 % EX GEL: Freq: Every day | CUTANEOUS | 0 refills | Status: DC

## 2020-06-10 NOTE — Progress Notes (Signed)
Patient ID: Gabrielle Yu, female   DOB: Feb 05, 1951, 70 y.o.   MRN: [id number]   Subjective:    Patient ID: Gabrielle Yu, female    DOB: [birth year], 70 y.o.   MRN: [id number]  HPI This visit occurred during the SARS-CoV-2 public health emergency.  Safety protocols were in place, including screening questions prior to the visit, additional usage of staff PPE, and extensive cleaning of exam room while observing appropriate contact time as indicated for disinfecting solutions.  Patient here for a scheduled follow up. Here to follow up regarding her blood sugar, cholesterol and blood pressure.  She is doing well.  Feels good.  Not as physically active recently secondary to cold weather. Plans to get more exercise.  Tries to watch her diet.  No chest pain or sob reported.  No abdominal pain or bowel change reported.  Does report "bumps" on her face.  Persistent intermittent issue for her.  No pain.     Past Medical History:  Diagnosis Date  . Diabetes mellitus ([place])   . High cholesterol   . Hypercholesterolemia   . Hypertension    Past Surgical History:  Procedure Laterality Date  . BREAST BIOPSY Left 2014   core with clip  . CATARACT EXTRACTION W/PHACO Right 01/24/2017   Procedure: CATARACT EXTRACTION PHACO AND INTRAOCULAR LENS PLACEMENT ([place]) RIGHT DIABETIC COMPLICATED;  Surgeon: Leandrew Koyanagi, MD;  Location: [facility];  Service: Ophthalmology;  Laterality: Right;  Diabetic - oral meds  . TONSILLECTOMY AND ADENOIDECTOMY  1958  . TUBAL LIGATION  1982   Family History  Problem Relation Age of Onset  . Breast cancer Mother 26  . Hypertension Mother   . Heart disease Father        myocardial infarction - died age 77  . Heart disease Other        paternal aunts and uncles  . Colon cancer Neg Hx    Social History   Socioeconomic History  . Marital status: Married    Spouse name: Not on file  . Number of children: 2  . Years of education: Not on file  .  Highest education level: Not on file  Occupational History    Employer: [employer]  Tobacco Use  . Smoking status: Never Smoker  . Smokeless tobacco: Never Used  Vaping Use  . Vaping Use: Never used  Substance and Sexual Activity  . Alcohol use: No    Alcohol/week: 0.0 standard drinks  . Drug use: No  . Sexual activity: Not on file  Other Topics Concern  . Not on file  Social History Narrative  . Not on file   Social Determinants of Health   Financial Resource Strain: Not on file  Food Insecurity: Not on file  Transportation Needs: Not on file  Physical Activity: Not on file  Stress: Not on file  Social Connections: Not on file    Outpatient Encounter Medications as of 06/10/2020  Medication Sig  . metroNIDAZOLE (METROGEL) 1 % gel Apply topically daily.  . [DISCONTINUED] mupirocin ointment (BACTROBAN) 2 % Apply 1 application topically 2 (two) times daily.  Marland Kitchen amLODipine (NORVASC) 5 MG tablet Take 1 tablet (5 mg total) by mouth daily.  . busPIRone (BUSPAR) 5 MG tablet TAKE 1 TABLET(5 MG) BY MOUTH DAILY  . empagliflozin (JARDIANCE) 25 MG TABS tablet Take 1 tablet (25 mg total) by mouth daily.  Marland Kitchen lisinopril (ZESTRIL) 40 MG tablet TAKE 1 TABLET(40 MG) BY MOUTH DAILY  .  metFORMIN (GLUCOPHAGE) 500 MG tablet TAKE 2 TABLETS(1000 MG) BY MOUTH TWICE DAILY  . rosuvastatin (CRESTOR) 10 MG tablet Take 1 tablet (10 mg total) by mouth daily.   No facility-administered encounter medications on file as of 06/10/2020.    Review of Systems  Constitutional: Negative for appetite change and unexpected weight change.  HENT: Negative for congestion and sinus pressure.   Respiratory: Negative for cough, chest tightness and shortness of breath.   Cardiovascular: Negative for chest pain, palpitations and leg swelling.  Gastrointestinal: Negative for abdominal pain, diarrhea, nausea and vomiting.  Genitourinary: Negative for difficulty urinating and dysuria.   Musculoskeletal: Negative for joint swelling and myalgias.  Skin: Negative for color change.       "bumps" on her face.   Neurological: Negative for dizziness, light-headedness and headaches.  Psychiatric/Behavioral: Negative for agitation and dysphoric mood.       Objective:    Physical Exam Vitals reviewed.  Constitutional:      General: She is not in acute distress.    Appearance: Normal appearance.  HENT:     Head: Normocephalic and atraumatic.     Right Ear: External ear normal.     Left Ear: External ear normal.     Mouth/Throat:     Mouth: Oropharynx is clear and moist.  Eyes:     General: No scleral icterus.       Right eye: No discharge.        Left eye: No discharge.     Conjunctiva/sclera: Conjunctivae normal.  Neck:     Thyroid: No thyromegaly.  Cardiovascular:     Rate and Rhythm: Normal rate and regular rhythm.  Pulmonary:     Effort: No respiratory distress.     Breath sounds: Normal breath sounds. No wheezing.  Abdominal:     General: Bowel sounds are normal.     Palpations: Abdomen is soft.     Tenderness: There is no abdominal tenderness.  Musculoskeletal:        General: No swelling, tenderness or edema.     Cervical back: Neck supple. No tenderness.  Lymphadenopathy:     Cervical: No cervical adenopathy.  Skin:    Findings: No erythema or rash.  Neurological:     Mental Status: She is alert.  Psychiatric:        Mood and Affect: Mood normal.        Behavior: Behavior normal.     BP 130/70   Pulse 86   Temp 98.1 F (36.7 C) (Oral)   Resp 16   Ht 5' 4"  (1.626 m)   Wt 157 lb (71.2 kg)   SpO2 99%   BMI 26.95 kg/m  Wt Readings from Last 3 Encounters:  06/10/20 157 lb (71.2 kg)  07/28/19 153 lb 9.6 oz (69.7 kg)  02/19/19 150 lb 12.8 oz (68.4 kg)     Lab Results  Component Value Date   WBC 6.9 06/09/2020   HGB 12.3 06/09/2020   HCT 37.4 06/09/2020   PLT 207.0 06/09/2020   GLUCOSE 106 (H) 06/09/2020   CHOL 140 06/09/2020    TRIG 78.0 06/09/2020   HDL 49.50 06/09/2020   LDLDIRECT 157.1 01/21/2013   LDLCALC 75 06/09/2020   ALT 35 06/09/2020   AST 26 06/09/2020   NA 142 06/09/2020   K 4.3 06/09/2020   CL 105 06/09/2020   CREATININE 0.83 06/09/2020   BUN 16 06/09/2020   CO2 30 06/09/2020   TSH 4.12 03/15/2020   HGBA1C 6.8 (  H) 06/09/2020   MICROALBUR 1.2 06/09/2020    MM 3D SCREEN BREAST BILATERAL  Result Date: 01/29/2020 CLINICAL DATA:  Screening. EXAM: DIGITAL SCREENING BILATERAL MAMMOGRAM WITH TOMO AND CAD COMPARISON:  Previous exam(s). ACR Breast Density Category b: There are scattered areas of fibroglandular density. FINDINGS: There are no findings suspicious for malignancy. Images were processed with CAD. IMPRESSION: No mammographic evidence of malignancy. A result letter of this screening mammogram will be mailed directly to the patient. RECOMMENDATION: Screening mammogram in one year. (Code:SM-B-01Y) BI-RADS CATEGORY  1: Negative. Electronically Signed   By: Dorise Bullion III M.D   On: 01/29/2020 15:11       Assessment & Plan:   Problem List Items Addressed This Visit    Abnormal liver function    Continue diet and exercise.  Recent liver panel wnl.  Follow.       Diabetes mellitus ([place])    Low carb diet and exercise.  Plans to start exercising more.  Follow met b and a1c.  Continues on metformin and jardiance.   Lab Results  Component Value Date   HGBA1C 6.8 (H) 06/09/2020         Relevant Orders   Hemoglobin R1R   Basic metabolic panel   Elevated TSH    Last tsh check wnl.  Follow.       Hypercholesteremia    Continue crestor.  Low cholesterol diet and exercise.  Follow lipid panel and liver function tests.        Relevant Orders   Lipid panel   Hepatic function panel   Hypertension    Continue lisinopril and amlodipine.  Blood pressure as outlined.  Follow pressures.  Follow metabolic panel.       Relevant Orders   TSH   Rash    Facial rash.  Trial of metrogel.  Follow.  Call with update.        Other Visit Diagnoses    Need for 23-polyvalent pneumococcal polysaccharide vaccine    -  Primary   Relevant Orders   Pneumococcal polysaccharide vaccine 23-valent greater than or equal to 2yo subcutaneous/IM (Completed)       Einar Pheasant, MD

## 2020-06-13 ENCOUNTER — Encounter: Payer: Self-pay | Admitting: Internal Medicine

## 2020-06-13 DIAGNOSIS — R21 Rash and other nonspecific skin eruption: Secondary | ICD-10-CM | POA: Insufficient documentation

## 2020-06-13 NOTE — Assessment & Plan Note (Signed)
Facial rash.  Trial of metrogel.  Follow. Call with update.

## 2020-06-13 NOTE — Assessment & Plan Note (Signed)
Low carb diet and exercise.  Plans to start exercising more.  Follow met b and a1c.  Continues on metformin and jardiance.   Lab Results  Component Value Date   HGBA1C 6.8 (H) 06/09/2020

## 2020-06-13 NOTE — Assessment & Plan Note (Signed)
Last tsh check wnl.  Follow.

## 2020-06-13 NOTE — Assessment & Plan Note (Signed)
Continue crestor.  Low cholesterol diet and exercise. Follow lipid panel and liver function tests.   

## 2020-06-13 NOTE — Assessment & Plan Note (Signed)
Continue diet and exercise.  Recent liver panel wnl.  Follow.

## 2020-06-13 NOTE — Assessment & Plan Note (Signed)
Continue lisinopril and amlodipine.  Blood pressure as outlined.  Follow pressures.  Follow metabolic panel.  

## 2020-08-19 ENCOUNTER — Other Ambulatory Visit: Payer: Self-pay | Admitting: Internal Medicine

## 2020-10-06 ENCOUNTER — Other Ambulatory Visit (INDEPENDENT_AMBULATORY_CARE_PROVIDER_SITE_OTHER): Payer: Medicare Other

## 2020-10-06 ENCOUNTER — Other Ambulatory Visit: Payer: Self-pay

## 2020-10-06 DIAGNOSIS — I10 Essential (primary) hypertension: Secondary | ICD-10-CM | POA: Diagnosis not present

## 2020-10-06 DIAGNOSIS — E7800 Pure hypercholesterolemia, unspecified: Secondary | ICD-10-CM

## 2020-10-06 DIAGNOSIS — E1165 Type 2 diabetes mellitus with hyperglycemia: Secondary | ICD-10-CM | POA: Diagnosis not present

## 2020-10-06 LAB — HEMOGLOBIN A1C: Hgb A1c MFr Bld: 7 % — ABNORMAL HIGH (ref 4.6–6.5)

## 2020-10-06 LAB — LIPID PANEL
Cholesterol: 156 mg/dL (ref 0–200)
HDL: 47.3 mg/dL (ref >39.00)
LDL Cholesterol: 89 mg/dL (ref 0–99)
NonHDL: 108.5
Total CHOL/HDL Ratio: 3
Triglycerides: 98 mg/dL (ref 0.0–149.0)
VLDL: 19.6 mg/dL (ref 0.0–40.0)

## 2020-10-06 LAB — BASIC METABOLIC PANEL
BUN: 14 mg/dL (ref 6–23)
CO2: 27 mEq/L (ref 19–32)
Calcium: 9.4 mg/dL (ref 8.4–10.5)
Chloride: 106 mEq/L (ref 96–112)
Creatinine, Ser: 0.85 mg/dL (ref 0.40–1.20)
GFR: 69.46 mL/min (ref >60.00)
Glucose, Bld: 98 mg/dL (ref 70–99)
Potassium: 4.3 mEq/L (ref 3.5–5.1)
Sodium: 142 mEq/L (ref 135–145)

## 2020-10-06 LAB — HEPATIC FUNCTION PANEL
ALT: 41 U/L — ABNORMAL HIGH (ref 0–35)
AST: 31 U/L (ref 0–37)
Albumin: 4.5 g/dL (ref 3.5–5.2)
Alkaline Phosphatase: 61 U/L (ref 39–117)
Bilirubin, Direct: 0.1 mg/dL (ref 0.0–0.3)
Total Bilirubin: 0.5 mg/dL (ref 0.2–1.2)
Total Protein: 6.7 g/dL (ref 6.0–8.3)

## 2020-10-06 LAB — TSH: TSH: 5.24 u[IU]/mL — ABNORMAL HIGH (ref 0.35–4.50)

## 2020-10-08 ENCOUNTER — Encounter: Payer: Medicare Other | Admitting: Internal Medicine

## 2020-10-12 ENCOUNTER — Telehealth (INDEPENDENT_AMBULATORY_CARE_PROVIDER_SITE_OTHER): Payer: Medicare Other | Admitting: Registered Nurse

## 2020-10-12 ENCOUNTER — Other Ambulatory Visit: Payer: Self-pay

## 2020-10-12 ENCOUNTER — Encounter: Payer: Self-pay | Admitting: Registered Nurse

## 2020-10-12 DIAGNOSIS — U071 COVID-19: Secondary | ICD-10-CM

## 2020-10-12 DIAGNOSIS — R070 Pain in throat: Secondary | ICD-10-CM

## 2020-10-12 MED ORDER — MOLNUPIRAVIR EUA 200MG CAPSULE: 4.0000 | ORAL_CAPSULE | Freq: Two times a day (BID) | ORAL | 0 refills | Status: AC

## 2020-10-12 MED ORDER — TRAMADOL HCL 50 MG PO TABS
25.0000 mg | ORAL_TABLET | Freq: Three times a day (TID) | ORAL | 0 refills | Status: DC | PRN
Start: 2020-10-12 — End: 2021-02-11

## 2020-10-12 NOTE — Progress Notes (Signed)
Telemedicine Encounter- SOAP NOTE Established Patient  This telephone encounter was conducted with the patient's (or proxy's) verbal consent via audio telecommunications: yes/no: Yes Patient was instructed to have this encounter in a suitably private space; and to only have persons present to whom they give permission to participate. In addition, patient identity was confirmed by use of name plus two identifiers (DOB and address).  I discussed the limitations, risks, security and privacy concerns of performing an evaluation and management service by telephone and the availability of in person appointments. I also discussed with the patient that there may be a patient responsible charge related to this service. The patient expressed understanding and agreed to proceed.  I spent a total of TIME; 0 MIN TO 60 MIN: 15 minutes talking with the patient or their proxy.  Patient at home Provider in office  Participants: Jari Sportsman, NP and Lanny Hurst  Chief Complaint  Patient presents with   Covid Positive    Patient states he tested positive for covid. Patient states she is experiencing a severe sore throat , coughing , and runny nose. Patient has took some OTC medications for sore throat with no relief.    Subjective   Gabrielle Yu is a 70 y.o. established patient. Telephone visit today for COVID  HPI Tested positive this morning Son visited last Wednesday and tested positive  Symptoms onset Sunday evening Sore throat - very sensitive. Trouble with food and liquids Some coughing, some runny nose  Has taken Halls Menthol cough drops - has helped some, but not a lot.  No shob, doe, chest pain, nvd, headache, or fevers Patient Active Problem List   Diagnosis Date Noted   Rash 06/13/2020   Elevated TSH 01/31/2020   Stress 10/06/2017   Health care maintenance 09/19/2015   Sebaceous cyst 06/24/2015   Neck nodule 06/17/2015   Abnormal mammogram 08/06/2012   Abnormal  liver function 05/31/2012   Diabetes mellitus ([facility]) 02/19/2012   Hypertension 02/19/2012   Hypercholesteremia 02/19/2012    Past Medical History:  Diagnosis Date   Diabetes mellitus ([facility])    High cholesterol    Hypercholesterolemia    Hypertension     Current Outpatient Medications  Medication Sig Dispense Refill   amLODipine (NORVASC) 5 MG tablet Take 1 tablet (5 mg total) by mouth daily. 90 tablet 3   busPIRone (BUSPAR) 5 MG tablet TAKE 1 TABLET BY MOUTH EVERY DAY 90 tablet 3   empagliflozin (JARDIANCE) 25 MG TABS tablet Take 1 tablet (25 mg total) by mouth daily. 90 tablet 3   lisinopril (ZESTRIL) 40 MG tablet TAKE 1 TABLET(40 MG) BY MOUTH DAILY 90 tablet 3   metFORMIN (GLUCOPHAGE) 500 MG tablet TAKE 2 TABLETS(1000 MG) BY MOUTH TWICE DAILY 360 tablet 3   rosuvastatin (CRESTOR) 10 MG tablet Take 1 tablet (10 mg total) by mouth daily. 90 tablet 3   metroNIDAZOLE (METROGEL) 1 % gel Apply topically daily. (Patient not taking: Reported on 10/12/2020) 45 g 0   No current facility-administered medications for this visit.    Allergies  Allergen Reactions   Zoloft [Sertraline Hcl] Rash    Social History   Socioeconomic History   Marital status: Married    Spouse name: Not on file   Number of children: 2   Years of education: Not on file   Highest education level: Not on file  Occupational History    Employer: [employer]  Tobacco Use   Smoking status: Never  Smokeless tobacco: Never  Vaping Use   Vaping Use: Never used  Substance and Sexual Activity   Alcohol use: No    Alcohol/week: 0.0 standard drinks   Drug use: No   Sexual activity: Not on file  Other Topics Concern   Not on file  Social History Narrative   Not on file   Social Determinants of Health   Financial Resource Strain: Not on file  Food Insecurity: Not on file  Transportation Needs: Not on file  Physical Activity: Not on file  Stress: Not on file  Social Connections: Not  on file  Intimate Partner Violence: Not on file    ROS Pertinent negatives and positives per hpi   Objective   Vitals as reported by the patient: There were no vitals filed for this visit.  [place] was seen today for covid positive.  Diagnoses and all orders for this visit:  Throat pain -     traMADol (ULTRAM) 50 MG tablet; Take 0.5-1 tablets (25-50 mg total) by mouth every 8 (eight) hours as needed.  COVID-19 -     molnupiravir EUA 200 mg CAPS; Take 4 capsules (800 mg total) by mouth 2 (two) times daily for 5 days.  PLAN Molnupiravir as above. Discussed r/b/se of this medication with patient who voiced understanding Recommend tylenol 500mg  PO q6h for sore throat. Will limit dose based on her past elevated LFTs.  Tramadol 25-50mg  PO q8h PRN for throat pain. Discussed risks of this medication Discussed ER and return to clinic precautions with pt who voices understanding Patient encouraged to call clinic with any questions, comments, or concerns.   I discussed the assessment and treatment plan with the patient. The patient was provided an opportunity to ask questions and all were answered. The patient agreed with the plan and demonstrated an understanding of the instructions.   The patient was advised to call back or seek an in-person evaluation if the symptoms worsen or if the condition fails to improve as anticipated.  I provided 15 minutes of non-face-to-face time during this encounter.  , NP  [facility] at [facility]

## 2020-10-12 NOTE — Patient Instructions (Signed)
° ° ° °  If you have lab work done today you will be contacted with your lab results within the next 2 weeks.  If you have not heard from us then please contact us. The fastest way to get your results is to register for My Chart. ° ° °IF you received an x-ray today, you will receive an invoice from [facility]. Please contact [facility] at [phone] with questions or concerns regarding your invoice.  ° °IF you received labwork today, you will receive an invoice from [facility]. Please contact [facility] at [phone] with questions or concerns regarding your invoice.  ° °Our billing staff will not be able to assist you with questions regarding bills from these companies. ° °You will be contacted with the lab results as soon as they are available. The fastest way to get your results is to activate your My Chart account. Instructions are located on the last page of this paperwork. If you have not heard from us regarding the results in 2 weeks, please contact this office. °  ° ° ° °

## 2020-11-05 ENCOUNTER — Ambulatory Visit (INDEPENDENT_AMBULATORY_CARE_PROVIDER_SITE_OTHER): Payer: Medicare Other | Admitting: Internal Medicine

## 2020-11-05 ENCOUNTER — Other Ambulatory Visit: Payer: Self-pay

## 2020-11-05 VITALS — BP 118/74 | HR 82 | Temp 97.1°F | Resp 16 | Ht 64.0 in | Wt 156.6 lb

## 2020-11-05 DIAGNOSIS — R945 Abnormal results of liver function studies: Secondary | ICD-10-CM

## 2020-11-05 DIAGNOSIS — I10 Essential (primary) hypertension: Secondary | ICD-10-CM | POA: Diagnosis not present

## 2020-11-05 DIAGNOSIS — Z0000 Encounter for general adult medical examination without abnormal findings: Secondary | ICD-10-CM | POA: Diagnosis not present

## 2020-11-05 DIAGNOSIS — E7800 Pure hypercholesterolemia, unspecified: Secondary | ICD-10-CM

## 2020-11-05 DIAGNOSIS — F439 Reaction to severe stress, unspecified: Secondary | ICD-10-CM

## 2020-11-05 DIAGNOSIS — R7989 Other specified abnormal findings of blood chemistry: Secondary | ICD-10-CM

## 2020-11-05 DIAGNOSIS — E1165 Type 2 diabetes mellitus with hyperglycemia: Secondary | ICD-10-CM | POA: Diagnosis not present

## 2020-11-05 LAB — TSH: TSH: 4.12 u[IU]/mL (ref 0.35–5.50)

## 2020-11-05 LAB — HEPATIC FUNCTION PANEL
ALT: 45 U/L — ABNORMAL HIGH (ref 0–35)
AST: 34 U/L (ref 0–37)
Albumin: 4.4 g/dL (ref 3.5–5.2)
Alkaline Phosphatase: 64 U/L (ref 39–117)
Bilirubin, Direct: 0.1 mg/dL (ref 0.0–0.3)
Total Bilirubin: 0.5 mg/dL (ref 0.2–1.2)
Total Protein: 6.7 g/dL (ref 6.0–8.3)

## 2020-11-05 NOTE — Assessment & Plan Note (Signed)
Physical today 11/05/20.  Mammogram 01/29/20 - birads I.  Declines colonoscopy. Discussed again today.  She declines.  

## 2020-11-05 NOTE — Progress Notes (Signed)
Patient ID: Gabrielle Yu, female   DOB: Sep 15, 1950, 70 y.o.   MRN: [id number]   Subjective:    Patient ID: Gabrielle Yu, female    DOB: [birth year], 70 y.o.   MRN: [id number]  HPI  This visit occurred during the SARS-CoV-2 public health emergency.  Safety protocols were in place, including screening questions prior to the visit, additional usage of staff PPE, and extensive cleaning of exam room while observing appropriate contact time as indicated for disinfecting solutions.   Patient with past history of diabetes, hypertension and hyperchoelsterolemia.  Here today to follow up on these issues as well as for a complete physical exam.  Discussed her recent labs.  Her a1c has increased - 7.0 on recent check. States she has not been watching her diet or exercising as much.  Plans to start.  Desires not to add medication.   No chest pain reported.  Breathing stable. No sob or chest tightness.  Had covid recently.  Diagnosed 6/21.  Feeling better.  Some persistent cough, but has improved.  Increased abdominal pain reported.  No bowel issues reported.  No sick contacts.  No fever.  No nausea or vomiting.  Past Surgical History:  Procedure Laterality Date   BREAST BIOPSY Left 2014   core with clip   CATARACT EXTRACTION W/PHACO Right 01/24/2017   Procedure: CATARACT EXTRACTION PHACO AND INTRAOCULAR LENS PLACEMENT ([place]) RIGHT DIABETIC COMPLICATED;  Surgeon: Leandrew Koyanagi, MD;  Location: [facility];  Service: Ophthalmology;  Laterality: Right;  Diabetic - oral meds   TONSILLECTOMY AND ADENOIDECTOMY  1958   TUBAL LIGATION  1982   Family History  Problem Relation Age of Onset   Breast cancer Mother 70   Hypertension Mother    Heart disease Father        myocardial infarction - died age 65   Heart disease Other        paternal aunts and uncles   Colon cancer Neg Hx    Social History   Socioeconomic History   Marital status: Married    Spouse name: Not on file   Number  of children: 2   Years of education: Not on file   Highest education level: Not on file  Occupational History    Employer: [employer]  Tobacco Use   Smoking status: Never   Smokeless tobacco: Never  Vaping Use   Vaping Use: Never used  Substance and Sexual Activity   Alcohol use: No    Alcohol/week: 0.0 standard drinks   Drug use: No   Sexual activity: Not on file  Other Topics Concern   Not on file  Social History Narrative   Not on file   Social Determinants of Health   Financial Resource Strain: Not on file  Food Insecurity: Not on file  Transportation Needs: Not on file  Physical Activity: Not on file  Stress: Not on file  Social Connections: Not on file    Review of Systems  Constitutional:  Negative for appetite change and unexpected weight change.  HENT:  Negative for congestion and sinus pressure.   Respiratory:  Negative for cough, chest tightness and shortness of breath.   Cardiovascular:  Negative for chest pain, palpitations and leg swelling.  Gastrointestinal:  Negative for abdominal pain, diarrhea, nausea and vomiting.  Genitourinary:  Negative for difficulty urinating and dysuria.  Musculoskeletal:  Negative for joint swelling and myalgias.  Skin:  Negative for color change and rash.  Neurological:  Negative for  dizziness, light-headedness and headaches.  Psychiatric/Behavioral:  Negative for agitation and dysphoric mood.       Objective:    Physical Exam Vitals reviewed.  Constitutional:      General: She is not in acute distress.    Appearance: Normal appearance.  HENT:     Head: Normocephalic and atraumatic.     Right Ear: External ear normal.     Left Ear: External ear normal.  Eyes:     General: No scleral icterus.       Right eye: No discharge.        Left eye: No discharge.     Conjunctiva/sclera: Conjunctivae normal.  Neck:     Thyroid: No thyromegaly.  Cardiovascular:     Rate and Rhythm: Normal rate and  regular rhythm.  Pulmonary:     Effort: No respiratory distress.     Breath sounds: Normal breath sounds. No wheezing.  Abdominal:     General: Bowel sounds are normal.     Palpations: Abdomen is soft.     Tenderness: There is no abdominal tenderness.  Musculoskeletal:        General: No swelling or tenderness.     Cervical back: Neck supple. No tenderness.  Lymphadenopathy:     Cervical: No cervical adenopathy.  Skin:    Findings: No erythema or rash.  Neurological:     Mental Status: She is alert.  Psychiatric:        Mood and Affect: Mood normal.        Behavior: Behavior normal.    BP 118/74   Pulse 82   Temp (!) 97.1 F (36.2 C)   Resp 16   Ht _0  (1.626 m)   Wt 156 lb 9.6 oz (71 kg)   SpO2 98%   BMI 26.88 kg/m  Wt Readings from Last 3 Encounters:  11/05/20 156 lb 9.6 oz (71 kg)  06/10/20 157 lb (71.2 kg)  07/28/19 153 lb 9.6 oz (69.7 kg)    Outpatient Encounter Medications as of 11/05/2020  Medication Sig   amLODipine (NORVASC) 5 MG tablet Take 1 tablet (5 mg total) by mouth daily.   busPIRone (BUSPAR) 5 MG tablet TAKE 1 TABLET BY MOUTH EVERY DAY   empagliflozin (JARDIANCE) 25 MG TABS tablet Take 1 tablet (25 mg total) by mouth daily.   lisinopril (ZESTRIL) 40 MG tablet TAKE 1 TABLET(40 MG) BY MOUTH DAILY   metFORMIN (GLUCOPHAGE) 500 MG tablet TAKE 2 TABLETS(1000 MG) BY MOUTH TWICE DAILY   rosuvastatin (CRESTOR) 10 MG tablet Take 1 tablet (10 mg total) by mouth daily.   traMADol (ULTRAM) 50 MG tablet Take 0.5-1 tablets (25-50 mg total) by mouth every 8 (eight) hours as needed.   [DISCONTINUED] metroNIDAZOLE (METROGEL) 1 % gel Apply topically daily. (Patient not taking: Reported on 10/12/2020)   No facility-administered encounter medications on file as of 11/05/2020.     Lab Results  Component Value Date   WBC 6.9 06/09/2020   HGB 12.3 06/09/2020   HCT 37.4 06/09/2020   PLT 207.0 06/09/2020   GLUCOSE 98 10/06/2020   CHOL 156 10/06/2020   TRIG 98.0  10/06/2020   HDL 47.30 10/06/2020   LDLDIRECT 157.1 01/21/2013   LDLCALC 89 10/06/2020   ALT 45 (H) 11/05/2020   AST 34 11/05/2020   NA 142 10/06/2020   K 4.3 10/06/2020   CL 106 10/06/2020   CREATININE 0.85 10/06/2020   BUN 14 10/06/2020   CO2 27 10/06/2020   TSH 4.12  11/05/2020   HGBA1C 7.0 (H) 10/06/2020   MICROALBUR 1.2 06/09/2020    MM 3D SCREEN BREAST BILATERAL  Result Date: 01/29/2020 CLINICAL DATA:  Screening. EXAM: DIGITAL SCREENING BILATERAL MAMMOGRAM WITH TOMO AND CAD COMPARISON:  Previous exam(s). ACR Breast Density Category b: There are scattered areas of fibroglandular density. FINDINGS: There are no findings suspicious for malignancy. Images were processed with CAD. IMPRESSION: No mammographic evidence of malignancy. A result letter of this screening mammogram will be mailed directly to the patient. RECOMMENDATION: Screening mammogram in one year. (Code:SM-B-01Y) BI-RADS CATEGORY  1: Negative. Electronically Signed   By: Dorise Bullion III M.D   On: 01/29/2020 15:11       Assessment & Plan:   Problem List Items Addressed This Visit     Abnormal liver function    Continue diet and exercise  Follow liver panel.        Relevant Orders   Hepatic function panel (Completed)   Diabetes mellitus ([place])    AM sugars as outlined.  Discussed diet and exercise.  Continue metformin and jardiance. Discussed additional medication given a1c 7.0.  She desires to hold on additional medication.  Follow met b and a1c.        Elevated TSH    Recent elevated tsh.  Recheck today to confirm wnl.        Health care maintenance    Physical today 11/05/20.  Mammogram 01/29/20 - birads I.  Declines colonoscopy. Discussed again today.  She declines.        Hypercholesteremia    Continue crestor.  Low cholesterol diet and exercise.  Follow lipid panel and liver function tests.         Relevant Orders   TSH (Completed)   Hypertension - Primary    Continue lisinopril and  amlodipine.  Blood pressure as outlined.  Follow pressures.  Follow metabolic panel.        Stress    Overall doing well.  Follow.          Einar Pheasant, MD

## 2020-11-08 ENCOUNTER — Encounter: Payer: Self-pay | Admitting: Internal Medicine

## 2020-11-08 NOTE — Assessment & Plan Note (Signed)
Continue crestor.  Low cholesterol diet and exercise. Follow lipid panel and liver function tests.   

## 2020-11-08 NOTE — Assessment & Plan Note (Signed)
Continue diet and exercise. Follow liver panel.  

## 2020-11-08 NOTE — Assessment & Plan Note (Signed)
Continue lisinopril and amlodipine.  Blood pressure as outlined.  Follow pressures.  Follow metabolic panel.  

## 2020-11-08 NOTE — Assessment & Plan Note (Signed)
AM sugars as outlined.  Discussed diet and exercise.  Continue metformin and jardiance. Discussed additional medication given a1c 7.0.  She desires to hold on additional medication.  Follow met b and a1c.

## 2020-11-08 NOTE — Assessment & Plan Note (Signed)
Overall doing well.  Follow.   

## 2020-11-08 NOTE — Assessment & Plan Note (Signed)
Recent elevated tsh.  Recheck today to confirm wnl.

## 2021-01-31 DIAGNOSIS — Z23 Encounter for immunization: Secondary | ICD-10-CM | POA: Diagnosis not present

## 2021-02-07 ENCOUNTER — Other Ambulatory Visit: Payer: Self-pay | Admitting: Internal Medicine

## 2021-02-09 ENCOUNTER — Other Ambulatory Visit: Payer: Self-pay

## 2021-02-09 ENCOUNTER — Other Ambulatory Visit (INDEPENDENT_AMBULATORY_CARE_PROVIDER_SITE_OTHER): Payer: Medicare Other

## 2021-02-09 DIAGNOSIS — E7800 Pure hypercholesterolemia, unspecified: Secondary | ICD-10-CM | POA: Diagnosis not present

## 2021-02-09 DIAGNOSIS — I10 Essential (primary) hypertension: Secondary | ICD-10-CM | POA: Diagnosis not present

## 2021-02-09 DIAGNOSIS — E1165 Type 2 diabetes mellitus with hyperglycemia: Secondary | ICD-10-CM | POA: Diagnosis not present

## 2021-02-09 LAB — BASIC METABOLIC PANEL
BUN: 11 mg/dL (ref 6–23)
CO2: 28 mEq/L (ref 19–32)
Calcium: 9.3 mg/dL (ref 8.4–10.5)
Chloride: 105 mEq/L (ref 96–112)
Creatinine, Ser: 0.87 mg/dL (ref 0.40–1.20)
GFR: 67.38 mL/min (ref >60.00)
Glucose, Bld: 107 mg/dL — ABNORMAL HIGH (ref 70–99)
Potassium: 4.5 mEq/L (ref 3.5–5.1)
Sodium: 143 mEq/L (ref 135–145)

## 2021-02-09 LAB — LIPID PANEL
Cholesterol: 143 mg/dL (ref 0–200)
HDL: 45.9 mg/dL (ref >39.00)
LDL Cholesterol: 74 mg/dL (ref 0–99)
NonHDL: 97.45
Total CHOL/HDL Ratio: 3
Triglycerides: 117 mg/dL (ref 0.0–149.0)
VLDL: 23.4 mg/dL (ref 0.0–40.0)

## 2021-02-09 LAB — HEPATIC FUNCTION PANEL
ALT: 30 U/L (ref 0–35)
AST: 26 U/L (ref 0–37)
Albumin: 4.3 g/dL (ref 3.5–5.2)
Alkaline Phosphatase: 58 U/L (ref 39–117)
Bilirubin, Direct: 0.1 mg/dL (ref 0.0–0.3)
Total Bilirubin: 0.5 mg/dL (ref 0.2–1.2)
Total Protein: 6.6 g/dL (ref 6.0–8.3)

## 2021-02-09 LAB — TSH: TSH: 5.83 u[IU]/mL — ABNORMAL HIGH (ref 0.35–5.50)

## 2021-02-09 LAB — HEMOGLOBIN A1C: Hgb A1c MFr Bld: 6.9 % — ABNORMAL HIGH (ref 4.6–6.5)

## 2021-02-11 ENCOUNTER — Other Ambulatory Visit: Payer: Self-pay

## 2021-02-11 ENCOUNTER — Encounter: Payer: Self-pay | Admitting: Internal Medicine

## 2021-02-11 ENCOUNTER — Ambulatory Visit (INDEPENDENT_AMBULATORY_CARE_PROVIDER_SITE_OTHER): Payer: Medicare Other | Admitting: Internal Medicine

## 2021-02-11 VITALS — BP 128/78 | HR 98 | Temp 97.1°F | Resp 16 | Ht 64.0 in | Wt 157.0 lb

## 2021-02-11 DIAGNOSIS — R945 Abnormal results of liver function studies: Secondary | ICD-10-CM

## 2021-02-11 DIAGNOSIS — I10 Essential (primary) hypertension: Secondary | ICD-10-CM | POA: Diagnosis not present

## 2021-02-11 DIAGNOSIS — E7800 Pure hypercholesterolemia, unspecified: Secondary | ICD-10-CM

## 2021-02-11 DIAGNOSIS — R7989 Other specified abnormal findings of blood chemistry: Secondary | ICD-10-CM

## 2021-02-11 DIAGNOSIS — Z1231 Encounter for screening mammogram for malignant neoplasm of breast: Secondary | ICD-10-CM | POA: Diagnosis not present

## 2021-02-11 DIAGNOSIS — E1165 Type 2 diabetes mellitus with hyperglycemia: Secondary | ICD-10-CM | POA: Diagnosis not present

## 2021-02-11 DIAGNOSIS — R21 Rash and other nonspecific skin eruption: Secondary | ICD-10-CM

## 2021-02-11 DIAGNOSIS — F439 Reaction to severe stress, unspecified: Secondary | ICD-10-CM

## 2021-02-11 NOTE — Progress Notes (Signed)
Patient ID: Gabrielle Yu, female   DOB: [birth year], 70 y.o.   MRN: [id number]   Subjective:    Patient ID: Gabrielle Yu, female    DOB: [birth year], 70 y.o.   MRN: [id number]  This visit occurred during the SARS-CoV-2 public health emergency.  Safety protocols were in place, including screening questions prior to the visit, additional usage of staff PPE, and extensive cleaning of exam room while observing appropriate contact time as indicated for disinfecting solutions.   Patient here for a scheduled follow up.    Chief Complaint  Patient presents with   Diabetes   Hypertension   Hyperlipidemia   .   HPI Doing well.  Feels good.  Staying active.  Husband received a good report.  Handling stress.  No chest pain or sob reported.  No abdominal pain.  Bowels moving.  Saw dermatology (Dr Kellie Moor) this week.  Had lesion removed - right side temple.  Waiting for pathology.  Using metronidazole on face.  Discussed labs.    Past Medical History:  Diagnosis Date   Diabetes mellitus ([place])    High cholesterol    Hypercholesterolemia    Hypertension    Past Surgical History:  Procedure Laterality Date   BREAST BIOPSY Left 2014   core with clip   CATARACT EXTRACTION W/PHACO Right 01/24/2017   Procedure: CATARACT EXTRACTION PHACO AND INTRAOCULAR LENS PLACEMENT ([place]) RIGHT DIABETIC COMPLICATED;  Surgeon: Leandrew Koyanagi, MD;  Location: [facility];  Service: Ophthalmology;  Laterality: Right;  Diabetic - oral meds   TONSILLECTOMY AND ADENOIDECTOMY  1958   TUBAL LIGATION  1982   Family History  Problem Relation Age of Onset   Breast cancer Mother 67   Hypertension Mother    Heart disease Father        myocardial infarction - died age 55   Heart disease Other        paternal aunts and uncles   Colon cancer Neg Hx    Social History   Socioeconomic History   Marital status: Married    Spouse name: Not on file   Number of children: 2   Years of education: Not  on file   Highest education level: Not on file  Occupational History    Employer: [employer]  Tobacco Use   Smoking status: Never   Smokeless tobacco: Never  Vaping Use   Vaping Use: Never used  Substance and Sexual Activity   Alcohol use: No    Alcohol/week: 0.0 standard drinks   Drug use: No   Sexual activity: Not on file  Other Topics Concern   Not on file  Social History Narrative   Not on file   Social Determinants of Health   Financial Resource Strain: Not on file  Food Insecurity: Not on file  Transportation Needs: Not on file  Physical Activity: Not on file  Stress: Not on file  Social Connections: Not on file     Review of Systems  Constitutional:  Negative for appetite change and unexpected weight change.  HENT:  Negative for congestion and sinus pressure.   Respiratory:  Negative for cough, chest tightness and shortness of breath.   Cardiovascular:  Negative for chest pain, palpitations and leg swelling.  Gastrointestinal:  Negative for abdominal pain, diarrhea, nausea and vomiting.  Genitourinary:  Negative for difficulty urinating and dysuria.  Musculoskeletal:  Negative for joint swelling and myalgias.  Skin:  Negative for color change and rash.  Neurological:  Negative  for dizziness, light-headedness and headaches.  Psychiatric/Behavioral:  Negative for agitation and dysphoric mood.       Objective:     BP 128/78   Pulse 98   Temp (!) 97.1 F (36.2 C)   Resp 16   Ht 5' 4"  (1.626 m)   Wt 157 lb (71.2 kg)   SpO2 98%   BMI 26.95 kg/m  Wt Readings from Last 3 Encounters:  02/11/21 157 lb (71.2 kg)  11/05/20 156 lb 9.6 oz (71 kg)  06/10/20 157 lb (71.2 kg)    Physical Exam Vitals reviewed.  Constitutional:      General: She is not in acute distress.    Appearance: Normal appearance.  HENT:     Head: Normocephalic and atraumatic.     Right Ear: External ear normal.     Left Ear: External ear normal.  Eyes:      General: No scleral icterus.       Right eye: No discharge.        Left eye: No discharge.     Conjunctiva/sclera: Conjunctivae normal.  Neck:     Thyroid: No thyromegaly.  Cardiovascular:     Rate and Rhythm: Normal rate and regular rhythm.  Pulmonary:     Effort: No respiratory distress.     Breath sounds: Normal breath sounds. No wheezing.  Abdominal:     General: Bowel sounds are normal.     Palpations: Abdomen is soft.     Tenderness: There is no abdominal tenderness.  Musculoskeletal:        General: No swelling or tenderness.     Cervical back: Neck supple. No tenderness.  Lymphadenopathy:     Cervical: No cervical adenopathy.  Skin:    Findings: No erythema or rash.  Neurological:     Mental Status: She is alert.  Psychiatric:        Mood and Affect: Mood normal.        Behavior: Behavior normal.     Outpatient Encounter Medications as of 02/11/2021  Medication Sig   amLODipine (NORVASC) 5 MG tablet Take 1 tablet (5 mg total) by mouth daily.   busPIRone (BUSPAR) 5 MG tablet TAKE 1 TABLET BY MOUTH EVERY DAY   JARDIANCE 25 MG TABS tablet TAKE 1 TABLET BY MOUTH EVERY DAY   lisinopril (ZESTRIL) 40 MG tablet TAKE 1 TABLET(40 MG) BY MOUTH DAILY   metFORMIN (GLUCOPHAGE) 500 MG tablet TAKE 2 TABLETS(1000 MG) BY MOUTH TWICE DAILY   rosuvastatin (CRESTOR) 10 MG tablet Take 1 tablet (10 mg total) by mouth daily.   [DISCONTINUED] traMADol (ULTRAM) 50 MG tablet Take 0.5-1 tablets (25-50 mg total) by mouth every 8 (eight) hours as needed. (Patient not taking: Reported on 02/11/2021)   No facility-administered encounter medications on file as of 02/11/2021.     Lab Results  Component Value Date   WBC 6.9 06/09/2020   HGB 12.3 06/09/2020   HCT 37.4 06/09/2020   PLT 207.0 06/09/2020   GLUCOSE 107 (H) 02/09/2021   CHOL 143 02/09/2021   TRIG 117.0 02/09/2021   HDL 45.90 02/09/2021   LDLDIRECT 157.1 01/21/2013   LDLCALC 74 02/09/2021   ALT 30 02/09/2021   AST 26 02/09/2021    NA 143 02/09/2021   K 4.5 02/09/2021   CL 105 02/09/2021   CREATININE 0.87 02/09/2021   BUN 11 02/09/2021   CO2 28 02/09/2021   TSH 5.83 (H) 02/09/2021   HGBA1C 6.9 (H) 02/09/2021   MICROALBUR 1.2 06/09/2020  MM 3D SCREEN BREAST BILATERAL  Result Date: 01/29/2020 CLINICAL DATA:  Screening. EXAM: DIGITAL SCREENING BILATERAL MAMMOGRAM WITH TOMO AND CAD COMPARISON:  Previous exam(s). ACR Breast Density Category b: There are scattered areas of fibroglandular density. FINDINGS: There are no findings suspicious for malignancy. Images were processed with CAD. IMPRESSION: No mammographic evidence of malignancy. A result letter of this screening mammogram will be mailed directly to the patient. RECOMMENDATION: Screening mammogram in one year. (Code:SM-B-01Y) BI-RADS CATEGORY  1: Negative. Electronically Signed   By: Dorise Bullion III M.D   On: 01/29/2020 15:11       Assessment & Plan:   Problem List Items Addressed This Visit     Abnormal liver function    Continue diet and exercise.  Recent check wnl.       Diabetes mellitus ([facility])    Recent a1c 6.9.  Discussed continued low carb diet and exercise.  Continue metformin and jardiance.  Follow met b and a1c.       Elevated TSH    Slightly elevated on recent lab check.  Recheck tsh in 6-8 weeks.        Hypercholesteremia    Continue crestor.  Low cholesterol diet and exercise.  Follow lipid panel and liver function tests.   Lab Results  Component Value Date   CHOL 143 02/09/2021   HDL 45.90 02/09/2021   LDLCALC 74 02/09/2021   LDLDIRECT 157.1 01/21/2013   TRIG 117.0 02/09/2021   CHOLHDL 3 02/09/2021       Relevant Orders   TSH   Hypertension    Continue lisinopril and amlodipine.  Blood pressure as outlined.  Follow pressures.  Follow metabolic panel.       Rash    Saw dermatology.  Recommended continuing metrogel.  Follow.       Stress    Overall appears to be doing well.  Follow.       Other Visit Diagnoses      Visit for screening mammogram    -  Primary   Relevant Orders   MM 3D SCREEN BREAST BILATERAL        Einar Pheasant, MD

## 2021-02-13 ENCOUNTER — Encounter: Payer: Self-pay | Admitting: Internal Medicine

## 2021-02-13 NOTE — Assessment & Plan Note (Signed)
Slightly elevated on recent lab check.  Recheck tsh in 6-8 weeks.

## 2021-02-13 NOTE — Assessment & Plan Note (Signed)
Continue crestor.  Low cholesterol diet and exercise.  Follow lipid panel and liver function tests.   Lab Results  Component Value Date   CHOL 143 02/09/2021   HDL 45.90 02/09/2021   LDLCALC 74 02/09/2021   LDLDIRECT 157.1 01/21/2013   TRIG 117.0 02/09/2021   CHOLHDL 3 02/09/2021

## 2021-02-13 NOTE — Assessment & Plan Note (Signed)
Continue diet and exercise.  Recent check wnl.

## 2021-02-13 NOTE — Assessment & Plan Note (Signed)
Overall appears to be doing well.  Follow.  

## 2021-02-13 NOTE — Assessment & Plan Note (Signed)
Saw dermatology.  Recommended continuing metrogel.  Follow.

## 2021-02-13 NOTE — Assessment & Plan Note (Signed)
Recent a1c 6.9.  Discussed continued low carb diet and exercise.  Continue metformin and jardiance.  Follow met b and a1c.

## 2021-02-13 NOTE — Assessment & Plan Note (Signed)
Continue lisinopril and amlodipine.  Blood pressure as outlined.  Follow pressures.  Follow metabolic panel.  

## 2021-02-19 ENCOUNTER — Telehealth: Payer: Self-pay | Admitting: Internal Medicine

## 2021-03-09 ENCOUNTER — Ambulatory Visit
Admission: RE | Admit: 2021-03-09 | Discharge: 2021-03-09 | Disposition: A | Discharge status: Home / Self Care | Payer: Medicare Other | Source: Ambulatory Visit | Attending: Internal Medicine | Admitting: Internal Medicine

## 2021-03-09 ENCOUNTER — Other Ambulatory Visit: Payer: Self-pay

## 2021-03-09 DIAGNOSIS — Z1231 Encounter for screening mammogram for malignant neoplasm of breast: Secondary | ICD-10-CM | POA: Insufficient documentation

## 2021-03-13 ENCOUNTER — Other Ambulatory Visit: Payer: Self-pay | Admitting: Internal Medicine

## 2021-03-14 ENCOUNTER — Other Ambulatory Visit: Payer: Self-pay

## 2021-03-14 MED ORDER — AMLODIPINE BESYLATE 5 MG PO TABS: 5.0000 mg | ORAL_TABLET | Freq: Every day | ORAL | 3 refills | Status: DC

## 2021-03-14 MED ORDER — ROSUVASTATIN CALCIUM 10 MG PO TABS: 10.0000 mg | ORAL_TABLET | Freq: Every day | ORAL | 3 refills | Status: DC

## 2021-03-14 MED ORDER — LISINOPRIL 40 MG PO TABS: ORAL_TABLET | ORAL | 3 refills | Status: DC

## 2021-03-14 MED ORDER — METFORMIN HCL 500 MG PO TABS: 1000.0000 mg | ORAL_TABLET | Freq: Two times a day (BID) | ORAL | 3 refills | Status: DC

## 2021-03-14 NOTE — Telephone Encounter (Signed)
Pt called in stating that the pharmacy called her and left a VM advising Pt that the doctor didn't authorize for the medication (metFORMIN (GLUCOPHAGE) 500 MG tablet). Pt stated that she never had this problem before. Pt would like for someone to look into this for her. Pt is requesting callback.

## 2021-03-14 NOTE — Telephone Encounter (Signed)
Medication was re-sent for one year,

## 2021-03-27 ENCOUNTER — Other Ambulatory Visit: Payer: Self-pay | Admitting: Internal Medicine

## 2021-04-07 ENCOUNTER — Other Ambulatory Visit: Payer: Self-pay

## 2021-04-07 ENCOUNTER — Other Ambulatory Visit (INDEPENDENT_AMBULATORY_CARE_PROVIDER_SITE_OTHER): Payer: Medicare Other

## 2021-04-07 DIAGNOSIS — E7800 Pure hypercholesterolemia, unspecified: Secondary | ICD-10-CM | POA: Diagnosis not present

## 2021-04-07 LAB — TSH: TSH: 5.42 u[IU]/mL (ref 0.35–5.50)

## 2021-04-29 ENCOUNTER — Other Ambulatory Visit: Payer: Self-pay | Admitting: Internal Medicine

## 2021-06-07 ENCOUNTER — Telehealth: Payer: Self-pay | Admitting: *Deleted

## 2021-06-07 DIAGNOSIS — E1165 Type 2 diabetes mellitus with hyperglycemia: Secondary | ICD-10-CM

## 2021-06-07 DIAGNOSIS — E7800 Pure hypercholesterolemia, unspecified: Secondary | ICD-10-CM

## 2021-06-07 DIAGNOSIS — I10 Essential (primary) hypertension: Secondary | ICD-10-CM

## 2021-06-07 NOTE — Telephone Encounter (Signed)
Please place future orders for lab appt.  

## 2021-06-07 NOTE — Telephone Encounter (Signed)
Future orders placed 

## 2021-06-14 ENCOUNTER — Other Ambulatory Visit: Payer: Medicare Other

## 2021-06-16 ENCOUNTER — Ambulatory Visit: Payer: Medicare Other | Admitting: Internal Medicine

## 2021-06-30 ENCOUNTER — Other Ambulatory Visit: Payer: Self-pay | Admitting: Internal Medicine

## 2021-08-23 ENCOUNTER — Telehealth: Payer: Self-pay | Admitting: Internal Medicine

## 2021-08-23 NOTE — Telephone Encounter (Signed)
S/w pt - advised to make sure the NDC, Lot and Expiration on letter does not match the bottle she currently has. ?Pt will call me back when able to check. ?

## 2021-08-23 NOTE — Telephone Encounter (Signed)
Patient called and said the [facility] called her to let her know that the medication JARDIANCE 25 MG TABS tablet. Because of a label mix up. She needs to know what to do. Please call her. ?

## 2021-09-25 ENCOUNTER — Other Ambulatory Visit: Payer: Self-pay | Admitting: Internal Medicine

## 2021-09-28 NOTE — Telephone Encounter (Signed)
LMTCB to schedule an appointment. Will give refill to hold over.

## 2021-09-30 ENCOUNTER — Encounter: Payer: Self-pay | Admitting: Internal Medicine

## 2021-10-10 ENCOUNTER — Ambulatory Visit: Payer: Medicare Other | Admitting: Internal Medicine

## 2021-11-17 ENCOUNTER — Ambulatory Visit (INDEPENDENT_AMBULATORY_CARE_PROVIDER_SITE_OTHER): Payer: Medicare Other | Admitting: Internal Medicine

## 2021-11-17 ENCOUNTER — Encounter: Payer: Self-pay | Admitting: Internal Medicine

## 2021-11-17 VITALS — BP 136/80 | HR 80 | Temp 97.9°F | Resp 17 | Ht 64.0 in | Wt 158.8 lb

## 2021-11-17 DIAGNOSIS — Z1159 Encounter for screening for other viral diseases: Secondary | ICD-10-CM | POA: Diagnosis not present

## 2021-11-17 DIAGNOSIS — E7800 Pure hypercholesterolemia, unspecified: Secondary | ICD-10-CM

## 2021-11-17 DIAGNOSIS — I10 Essential (primary) hypertension: Secondary | ICD-10-CM

## 2021-11-17 DIAGNOSIS — F439 Reaction to severe stress, unspecified: Secondary | ICD-10-CM

## 2021-11-17 DIAGNOSIS — Z7185 Encounter for immunization safety counseling: Secondary | ICD-10-CM

## 2021-11-17 DIAGNOSIS — E2839 Other primary ovarian failure: Secondary | ICD-10-CM

## 2021-11-17 DIAGNOSIS — Z1231 Encounter for screening mammogram for malignant neoplasm of breast: Secondary | ICD-10-CM

## 2021-11-17 DIAGNOSIS — E1165 Type 2 diabetes mellitus with hyperglycemia: Secondary | ICD-10-CM | POA: Diagnosis not present

## 2021-11-17 DIAGNOSIS — R945 Abnormal results of liver function studies: Secondary | ICD-10-CM

## 2021-11-17 LAB — LIPID PANEL
Cholesterol: 168 mg/dL (ref 0–200)
HDL: 48.3 mg/dL (ref >39.00)
LDL Cholesterol: 97 mg/dL (ref 0–99)
NonHDL: 119.81
Total CHOL/HDL Ratio: 3
Triglycerides: 115 mg/dL (ref 0.0–149.0)
VLDL: 23 mg/dL (ref 0.0–40.0)

## 2021-11-17 LAB — BASIC METABOLIC PANEL
BUN: 12 mg/dL (ref 6–23)
CO2: 28 mEq/L (ref 19–32)
Calcium: 9.3 mg/dL (ref 8.4–10.5)
Chloride: 104 mEq/L (ref 96–112)
Creatinine, Ser: 0.9 mg/dL (ref 0.40–1.20)
GFR: 64.35 mL/min (ref >60.00)
Glucose, Bld: 123 mg/dL — ABNORMAL HIGH (ref 70–99)
Potassium: 4 mEq/L (ref 3.5–5.1)
Sodium: 142 mEq/L (ref 135–145)

## 2021-11-17 LAB — HEPATIC FUNCTION PANEL
ALT: 23 U/L (ref 0–35)
AST: 22 U/L (ref 0–37)
Albumin: 4.6 g/dL (ref 3.5–5.2)
Alkaline Phosphatase: 55 U/L (ref 39–117)
Bilirubin, Direct: 0.1 mg/dL (ref 0.0–0.3)
Total Bilirubin: 0.5 mg/dL (ref 0.2–1.2)
Total Protein: 7.3 g/dL (ref 6.0–8.3)

## 2021-11-17 LAB — CBC WITH DIFFERENTIAL/PLATELET
Basophils Absolute: 0 10*3/uL (ref 0.0–0.1)
Basophils Relative: 0.8 % (ref 0.0–3.0)
Eosinophils Absolute: 0 10*3/uL (ref 0.0–0.7)
Eosinophils Relative: 0 % (ref 0.0–5.0)
HCT: 39.2 % (ref 36.0–46.0)
Hemoglobin: 12.7 g/dL (ref 12.0–15.0)
Lymphocytes Relative: 30.9 % (ref 12.0–46.0)
Lymphs Abs: 1.8 10*3/uL (ref 0.7–4.0)
MCHC: 32.5 g/dL (ref 30.0–36.0)
MCV: 88.5 fl (ref 78.0–100.0)
Monocytes Absolute: 0.5 10*3/uL (ref 0.1–1.0)
Monocytes Relative: 9.3 % (ref 3.0–12.0)
Neutro Abs: 3.3 10*3/uL (ref 1.4–7.7)
Neutrophils Relative %: 59 % (ref 43.0–77.0)
Platelets: 209 10*3/uL (ref 150.0–400.0)
RBC: 4.42 Mil/uL (ref 3.87–5.11)
RDW: 13.8 % (ref 11.5–15.5)
WBC: 5.7 10*3/uL (ref 4.0–10.5)

## 2021-11-17 LAB — MICROALBUMIN / CREATININE URINE RATIO
Creatinine,U: 96.1 mg/dL
Microalb Creat Ratio: 1.3 mg/g (ref 0.0–30.0)
Microalb, Ur: 1.2 mg/dL (ref 0.0–1.9)

## 2021-11-17 LAB — HEMOGLOBIN A1C: Hgb A1c MFr Bld: 7.1 % — ABNORMAL HIGH (ref 4.6–6.5)

## 2021-11-17 NOTE — Patient Instructions (Signed)
Vaccines:   Shingrx  Tdap

## 2021-11-17 NOTE — Progress Notes (Signed)
Patient ID: Gabrielle Yu, female   DOB: 08/19/1950, 71 y.o.   MRN: [id number]   Subjective:    Patient ID: Gabrielle Yu, female    DOB: 01-12-1951, 71 y.o.   MRN: [id number]   Patient here for a scheduled follow up.   Chief Complaint  Patient presents with   Hypertension   Diabetes   .   Hypertension Pertinent negatives include no chest pain, headaches, palpitations or shortness of breath.  Diabetes Pertinent negatives for hypoglycemia include no dizziness or headaches. Pertinent negatives for diabetes include no chest pain.   Reports she is doing relatively well.  Tries to stay active.  No chest pain or sob reported.  Has not been watching her diet as well lately.  States sugars - 130.  No acid reflux.  No abdominal pain or bowel change.  No blood in stool.  Discussed vaccines due.  Some increased stress with her husband's medical issues. He fell and broke his arm.  Doing better now.  Overall she feels she is handling things relatively well.    Past Medical History:  Diagnosis Date   Diabetes mellitus ([place])    High cholesterol    Hypercholesterolemia    Hypertension    Past Surgical History:  Procedure Laterality Date   BREAST BIOPSY Left 2014   core with clip   CATARACT EXTRACTION W/PHACO Right 01/24/2017   Procedure: CATARACT EXTRACTION PHACO AND INTRAOCULAR LENS PLACEMENT ([place]) RIGHT DIABETIC COMPLICATED;  Surgeon: Leandrew Koyanagi, MD;  Location: [facility];  Service: Ophthalmology;  Laterality: Right;  Diabetic - oral meds   TONSILLECTOMY AND ADENOIDECTOMY  1958   TUBAL LIGATION  1982   Family History  Problem Relation Age of Onset   Breast cancer Mother 34   Hypertension Mother    Heart disease Father        myocardial infarction - died age 45   Heart disease Other        paternal aunts and uncles   Colon cancer Neg Hx    Social History   Socioeconomic History   Marital status: Married    Spouse name: Not on file   Number of  children: 2   Years of education: Not on file   Highest education level: Not on file  Occupational History    Employer: [employer]  Tobacco Use   Smoking status: Never   Smokeless tobacco: Never  Vaping Use   Vaping Use: Never used  Substance and Sexual Activity   Alcohol use: No    Alcohol/week: 0.0 standard drinks of alcohol   Drug use: No   Sexual activity: Not on file  Other Topics Concern   Not on file  Social History Narrative   Not on file   Social Determinants of Health   Financial Resource Strain: Not on file  Food Insecurity: Not on file  Transportation Needs: Not on file  Physical Activity: Not on file  Stress: Not on file  Social Connections: Not on file     Review of Systems  Constitutional:  Negative for appetite change and unexpected weight change.  HENT:  Negative for congestion and sinus pressure.   Respiratory:  Negative for cough, chest tightness and shortness of breath.   Cardiovascular:  Negative for chest pain, palpitations and leg swelling.  Gastrointestinal:  Negative for abdominal pain, diarrhea, nausea and vomiting.  Genitourinary:  Negative for difficulty urinating and dysuria.  Musculoskeletal:  Negative for joint swelling and myalgias.  Skin:  Negative for color change and rash.  Neurological:  Negative for dizziness, light-headedness and headaches.  Psychiatric/Behavioral:  Negative for agitation and dysphoric mood.        Increased stress as outlined.        Objective:     BP 136/80 (BP Location: Left Arm, Patient Position: Sitting, Cuff Size: Small)   Pulse 80   Temp 97.9 F (36.6 C) (Temporal)   Resp 17   Ht _0  (1.626 m)   Wt 158 lb 12.8 oz (72 kg)   SpO2 98%   BMI 27.26 kg/m  Wt Readings from Last 3 Encounters:  11/17/21 158 lb 12.8 oz (72 kg)  02/11/21 157 lb (71.2 kg)  11/05/20 156 lb 9.6 oz (71 kg)    Physical Exam Vitals reviewed.  Constitutional:      General: She is not in acute  distress.    Appearance: Normal appearance.  HENT:     Head: Normocephalic and atraumatic.     Right Ear: External ear normal.     Left Ear: External ear normal.  Eyes:     General: No scleral icterus.       Right eye: No discharge.        Left eye: No discharge.     Conjunctiva/sclera: Conjunctivae normal.  Neck:     Thyroid: No thyromegaly.  Cardiovascular:     Rate and Rhythm: Normal rate and regular rhythm.  Pulmonary:     Effort: No respiratory distress.     Breath sounds: Normal breath sounds. No wheezing.  Abdominal:     General: Bowel sounds are normal.     Palpations: Abdomen is soft.     Tenderness: There is no abdominal tenderness.  Musculoskeletal:        General: No swelling or tenderness.     Cervical back: Neck supple. No tenderness.  Lymphadenopathy:     Cervical: No cervical adenopathy.  Skin:    Findings: No erythema or rash.  Neurological:     Mental Status: She is alert.  Psychiatric:        Mood and Affect: Mood normal.        Behavior: Behavior normal.      Outpatient Encounter Medications as of 11/17/2021  Medication Sig   amLODipine (NORVASC) 5 MG tablet Take 1 tablet (5 mg total) by mouth daily.   busPIRone (BUSPAR) 5 MG tablet TAKE 1 TABLET BY MOUTH EVERY DAY   JARDIANCE 25 MG TABS tablet TAKE 1 TABLET BY MOUTH EVERY DAY   lisinopril (ZESTRIL) 40 MG tablet TAKE 1 TABLET(40 MG) BY MOUTH DAILY   metFORMIN (GLUCOPHAGE) 500 MG tablet Take 2 tablets (1,000 mg total) by mouth 2 (two) times daily with a meal.   metroNIDAZOLE (METROGEL) 1 % gel APPLY TO AFFECTED AREA TOPICALLY EVERY DAY   rosuvastatin (CRESTOR) 10 MG tablet Take 1 tablet (10 mg total) by mouth daily.   No facility-administered encounter medications on file as of 11/17/2021.     Lab Results  Component Value Date   WBC 5.7 11/17/2021   HGB 12.7 11/17/2021   HCT 39.2 11/17/2021   PLT 209.0 11/17/2021   GLUCOSE 123 (H) 11/17/2021   CHOL 168 11/17/2021   TRIG 115.0 11/17/2021    HDL 48.30 11/17/2021   LDLDIRECT 157.1 01/21/2013   LDLCALC 97 11/17/2021   ALT 23 11/17/2021   AST 22 11/17/2021   NA 142 11/17/2021   K 4.0 11/17/2021   CL 104 11/17/2021   CREATININE  0.90 11/17/2021   BUN 12 11/17/2021   CO2 28 11/17/2021   TSH 5.42 04/07/2021   HGBA1C 7.1 (H) 11/17/2021   MICROALBUR 1.2 11/17/2021    MM 3D SCREEN BREAST BILATERAL  Result Date: 03/09/2021 CLINICAL DATA:  Screening. EXAM: DIGITAL SCREENING BILATERAL MAMMOGRAM WITH TOMOSYNTHESIS AND CAD TECHNIQUE: Bilateral screening digital craniocaudal and mediolateral oblique mammograms were obtained. Bilateral screening digital breast tomosynthesis was performed. The images were evaluated with computer-aided detection. COMPARISON:  Previous exam(s). ACR Breast Density Category b: There are scattered areas of fibroglandular density. FINDINGS: There are no findings suspicious for malignancy. IMPRESSION: No mammographic evidence of malignancy. A result letter of this screening mammogram will be mailed directly to the patient. RECOMMENDATION: Screening mammogram in one year. (Code:SM-B-01Y) BI-RADS CATEGORY  1: Negative. Electronically Signed   By: Lillia Mountain M.D.   On: 03/09/2021 11:54      Assessment & Plan:   Problem List Items Addressed This Visit     Abnormal liver function    Continue diet and exercise.  Check liver panel today.       Diabetes mellitus ([place]) - Primary    Continues on metformin and jardiance.  Has not been watching diet as well lately.  Low carb diet and exercise.  Check met b and a1c today.       Relevant Orders   Urine Microalbumin w/creat. ratio (Completed)   Estrogen deficiency    Discussed bone density.  Wants to schedule with next mammogram.       Relevant Orders   DG Bone Density   Hypercholesteremia    Continue crestor.  Low cholesterol diet and exercise.  Follow lipid panel and liver function tests.   Lab Results  Component Value Date   CHOL 168 11/17/2021   HDL 48.30  11/17/2021   LDLCALC 97 11/17/2021   LDLDIRECT 157.1 01/21/2013   TRIG 115.0 11/17/2021   CHOLHDL 3 11/17/2021       Hypertension    Continue lisinopril and amlodipine.  Blood pressure as outlined.  Follow pressures.  Follow metabolic panel. Schedule f/u to reassess.       Stress    Increased stress as outlined.  Discussed.  Does not feel needs any further intervention.  Follow.       Vaccine counseling    Discussed shingrx and tetanus - due.  Plans to get these at the pharmacy.       Other Visit Diagnoses     Encounter for hepatitis C screening test for low risk patient       Relevant Orders   Hepatitis C Antibody   Encounter for screening mammogram for malignant neoplasm of breast       Relevant Orders   MM 3D SCREEN BREAST BILATERAL        Einar Pheasant, MD

## 2021-11-18 ENCOUNTER — Encounter: Payer: Self-pay | Admitting: Internal Medicine

## 2021-11-18 ENCOUNTER — Other Ambulatory Visit: Payer: Self-pay

## 2021-11-18 ENCOUNTER — Telehealth: Payer: Self-pay

## 2021-11-18 DIAGNOSIS — Z7185 Encounter for immunization safety counseling: Secondary | ICD-10-CM | POA: Insufficient documentation

## 2021-11-18 DIAGNOSIS — E2839 Other primary ovarian failure: Secondary | ICD-10-CM | POA: Insufficient documentation

## 2021-11-18 LAB — HEPATITIS C ANTIBODY: Hepatitis C Ab: NONREACTIVE

## 2021-11-18 MED ORDER — ROSUVASTATIN CALCIUM 20 MG PO TABS: 20.0000 mg | ORAL_TABLET | Freq: Every day | ORAL | 3 refills | Status: DC

## 2021-11-18 NOTE — Telephone Encounter (Signed)
Patient states she is returning our call regarding her lab results.  Donney Dice, CMA, is unavailable at the moment.  I let patient know that I will let Maralyn Sago know she returned her call.

## 2021-11-18 NOTE — Assessment & Plan Note (Signed)
Increased stress as outlined.  Discussed.  Does not feel needs any further intervention.  Follow.  

## 2021-11-18 NOTE — Assessment & Plan Note (Signed)
Discussed bone density.  Wants to schedule with next mammogram.

## 2021-11-18 NOTE — Assessment & Plan Note (Signed)
Discussed shingrx and tetanus - due.  Plans to get these at the pharmacy.

## 2021-11-18 NOTE — Assessment & Plan Note (Signed)
Continue crestor.  Low cholesterol diet and exercise.  Follow lipid panel and liver function tests.   Lab Results  Component Value Date   CHOL 168 11/17/2021   HDL 48.30 11/17/2021   LDLCALC 97 11/17/2021   LDLDIRECT 157.1 01/21/2013   TRIG 115.0 11/17/2021   CHOLHDL 3 11/17/2021   

## 2021-11-18 NOTE — Telephone Encounter (Signed)
LMTCB for lab results.  

## 2021-11-18 NOTE — Assessment & Plan Note (Addendum)
Continue lisinopril and amlodipine.  Blood pressure as outlined.  Follow pressures.  Follow metabolic panel. Schedule f/u to reassess.

## 2021-11-18 NOTE — Telephone Encounter (Signed)
See result note. Pt aware.  

## 2021-11-18 NOTE — Assessment & Plan Note (Signed)
Continue diet and exercise.  Check liver panel today.

## 2021-11-18 NOTE — Assessment & Plan Note (Signed)
Continues on metformin and jardiance.  Has not been watching diet as well lately.  Low carb diet and exercise.  Check met b and a1c today.

## 2022-01-10 ENCOUNTER — Encounter: Payer: Self-pay | Admitting: Internal Medicine

## 2022-01-10 ENCOUNTER — Ambulatory Visit (INDEPENDENT_AMBULATORY_CARE_PROVIDER_SITE_OTHER): Payer: Medicare Other | Admitting: Internal Medicine

## 2022-01-10 VITALS — BP 138/70 | HR 100 | Temp 98.2°F | Ht 64.0 in | Wt 158.2 lb

## 2022-01-10 DIAGNOSIS — E7800 Pure hypercholesterolemia, unspecified: Secondary | ICD-10-CM | POA: Diagnosis not present

## 2022-01-10 DIAGNOSIS — I10 Essential (primary) hypertension: Secondary | ICD-10-CM

## 2022-01-10 DIAGNOSIS — F439 Reaction to severe stress, unspecified: Secondary | ICD-10-CM | POA: Diagnosis not present

## 2022-01-10 DIAGNOSIS — R945 Abnormal results of liver function studies: Secondary | ICD-10-CM

## 2022-01-10 DIAGNOSIS — Z23 Encounter for immunization: Secondary | ICD-10-CM

## 2022-01-10 DIAGNOSIS — E1165 Type 2 diabetes mellitus with hyperglycemia: Secondary | ICD-10-CM | POA: Diagnosis not present

## 2022-01-10 LAB — HM DIABETES FOOT EXAM

## 2022-01-10 MED ORDER — AMLODIPINE BESYLATE 5 MG PO TABS
5.0000 mg | ORAL_TABLET | Freq: Two times a day (BID) | ORAL | 1 refills | Status: DC
Start: 2022-01-10 — End: 2022-03-07

## 2022-01-10 MED ORDER — BUSPIRONE HCL 5 MG PO TABS: 5.0000 mg | ORAL_TABLET | Freq: Two times a day (BID) | ORAL | 2 refills | Status: DC | PRN

## 2022-01-10 NOTE — Progress Notes (Signed)
Patient ID: Gabrielle Yu, female   DOB: [birth year], 71 y.o.   MRN: [id number]   Subjective:    Patient ID: Gabrielle Yu, female    DOB: Aug 29, 1950, 71 y.o.   MRN: [id number]   Patient here for  Chief Complaint  Patient presents with   Follow-up    6-8 week follow up   .   HPI Here to follow up regarding her blood sugar, cholesterol and blood pressure.  Reports she is doing relatively well.  Trying to watch her diet.  No chest pain or sob reported.  No abdominal pain.  Bowels moving.  Taking metformin 1 one in am and two q pm.  Tolerating this dose.  No stomach upset.  Discussed buspar.  Increase - bid dosing.  Outside blood pressures reviewed - elevated above goal - 130s-140.     Past Medical History:  Diagnosis Date   Diabetes mellitus ([place])    High cholesterol    Hypercholesterolemia    Hypertension    Past Surgical History:  Procedure Laterality Date   BREAST BIOPSY Left 2014   core with clip   CATARACT EXTRACTION W/PHACO Right 01/24/2017   Procedure: CATARACT EXTRACTION PHACO AND INTRAOCULAR LENS PLACEMENT ([place]) RIGHT DIABETIC COMPLICATED;  Surgeon: Leandrew Koyanagi, MD;  Location: [place];  Service: Ophthalmology;  Laterality: Right;  Diabetic - oral meds   TONSILLECTOMY AND ADENOIDECTOMY  1958   TUBAL LIGATION  1982   Family History  Problem Relation Age of Onset   Breast cancer Mother 2   Hypertension Mother    Heart disease Father        myocardial infarction - died age 55   Heart disease Other        paternal aunts and uncles   Colon cancer Neg Hx    Social History   Socioeconomic History   Marital status: Married    Spouse name: Not on file   Number of children: 2   Years of education: Not on file   Highest education level: Not on file  Occupational History    Employer: [employer]  Tobacco Use   Smoking status: Never   Smokeless tobacco: Never  Vaping Use   Vaping Use: Never used  Substance and  Sexual Activity   Alcohol use: No    Alcohol/week: 0.0 standard drinks of alcohol   Drug use: No   Sexual activity: Not on file  Other Topics Concern   Not on file  Social History Narrative   Not on file   Social Determinants of Health   Financial Resource Strain: Not on file  Food Insecurity: Not on file  Transportation Needs: Not on file  Physical Activity: Not on file  Stress: Not on file  Social Connections: Not on file     Review of Systems  Constitutional:  Negative for appetite change and unexpected weight change.  HENT:  Negative for congestion and sinus pressure.   Respiratory:  Negative for cough, chest tightness and shortness of breath.   Cardiovascular:  Negative for chest pain, palpitations and leg swelling.  Gastrointestinal:  Negative for abdominal pain, diarrhea, nausea and vomiting.  Genitourinary:  Negative for difficulty urinating and dysuria.  Musculoskeletal:  Negative for joint swelling and myalgias.  Skin:  Negative for color change and rash.  Neurological:  Negative for dizziness, light-headedness and headaches.  Psychiatric/Behavioral:  Negative for agitation and dysphoric mood.        Objective:     BP  138/70 (BP Location: Left Arm, Patient Position: Sitting, Cuff Size: Normal)   Pulse 100   Temp 98.2 F (36.8 C) (Oral)   Ht 5' 4"  (1.626 m)   Wt 158 lb 3.2 oz (71.8 kg)   SpO2 95%   BMI 27.15 kg/m  Wt Readings from Last 3 Encounters:  01/10/22 158 lb 3.2 oz (71.8 kg)  11/17/21 158 lb 12.8 oz (72 kg)  02/11/21 157 lb (71.2 kg)    Physical Exam Vitals reviewed.  Constitutional:      General: She is not in acute distress.    Appearance: Normal appearance.  HENT:     Head: Normocephalic and atraumatic.     Right Ear: External ear normal.     Left Ear: External ear normal.  Eyes:     General: No scleral icterus.       Right eye: No discharge.        Left eye: No discharge.     Conjunctiva/sclera: Conjunctivae normal.  Neck:      Thyroid: No thyromegaly.  Cardiovascular:     Rate and Rhythm: Normal rate and regular rhythm.  Pulmonary:     Effort: No respiratory distress.     Breath sounds: Normal breath sounds. No wheezing.  Abdominal:     General: Bowel sounds are normal.     Palpations: Abdomen is soft.     Tenderness: There is no abdominal tenderness.  Musculoskeletal:        General: No swelling or tenderness.     Cervical back: Neck supple. No tenderness.  Lymphadenopathy:     Cervical: No cervical adenopathy.  Skin:    Findings: No erythema or rash.  Neurological:     Mental Status: She is alert.  Psychiatric:        Mood and Affect: Mood normal.        Behavior: Behavior normal.      Outpatient Encounter Medications as of 01/10/2022  Medication Sig   JARDIANCE 25 MG TABS tablet TAKE 1 TABLET BY MOUTH EVERY DAY   lisinopril (ZESTRIL) 40 MG tablet TAKE 1 TABLET(40 MG) BY MOUTH DAILY   metFORMIN (GLUCOPHAGE) 500 MG tablet Take 2 tablets (1,000 mg total) by mouth 2 (two) times daily with a meal.   metroNIDAZOLE (METROGEL) 1 % gel APPLY TO AFFECTED AREA TOPICALLY EVERY DAY   rosuvastatin (CRESTOR) 20 MG tablet Take 1 tablet (20 mg total) by mouth daily.   [DISCONTINUED] amLODipine (NORVASC) 5 MG tablet Take 1 tablet (5 mg total) by mouth daily.   [DISCONTINUED] busPIRone (BUSPAR) 5 MG tablet TAKE 1 TABLET BY MOUTH EVERY DAY   amLODipine (NORVASC) 5 MG tablet Take 1 tablet (5 mg total) by mouth 2 (two) times daily.   busPIRone (BUSPAR) 5 MG tablet Take 1 tablet (5 mg total) by mouth 2 (two) times daily as needed.   No facility-administered encounter medications on file as of 01/10/2022.     Lab Results  Component Value Date   WBC 5.7 11/17/2021   HGB 12.7 11/17/2021   HCT 39.2 11/17/2021   PLT 209.0 11/17/2021   GLUCOSE 123 (H) 11/17/2021   CHOL 168 11/17/2021   TRIG 115.0 11/17/2021   HDL 48.30 11/17/2021   LDLDIRECT 157.1 01/21/2013   LDLCALC 97 11/17/2021   ALT 23 11/17/2021   AST 22  11/17/2021   NA 142 11/17/2021   K 4.0 11/17/2021   CL 104 11/17/2021   CREATININE 0.90 11/17/2021   BUN 12 11/17/2021   CO2  28 11/17/2021   TSH 5.42 04/07/2021   HGBA1C 7.1 (H) 11/17/2021   MICROALBUR 1.2 11/17/2021    MM 3D SCREEN BREAST BILATERAL  Result Date: 03/09/2021 CLINICAL DATA:  Screening. EXAM: DIGITAL SCREENING BILATERAL MAMMOGRAM WITH TOMOSYNTHESIS AND CAD TECHNIQUE: Bilateral screening digital craniocaudal and mediolateral oblique mammograms were obtained. Bilateral screening digital breast tomosynthesis was performed. The images were evaluated with computer-aided detection. COMPARISON:  Previous exam(s). ACR Breast Density Category b: There are scattered areas of fibroglandular density. FINDINGS: There are no findings suspicious for malignancy. IMPRESSION: No mammographic evidence of malignancy. A result letter of this screening mammogram will be mailed directly to the patient. RECOMMENDATION: Screening mammogram in one year. (Code:SM-B-01Y) BI-RADS CATEGORY  1: Negative. Electronically Signed   By: Lillia Mountain M.D.   On: 03/09/2021 11:54      Assessment & Plan:   Problem List Items Addressed This Visit     Abnormal liver function    Continue diet and exercise.  Follow lipid panel.       Diabetes mellitus ([place])    Continues on metformin and jardiance.  Low carb diet and exercise.  Follow met b and a1c.       Hypercholesteremia    Continue crestor.  Low cholesterol diet and exercise.  Follow lipid panel and liver function tests.   Lab Results  Component Value Date   CHOL 168 11/17/2021   HDL 48.30 11/17/2021   LDLCALC 97 11/17/2021   LDLDIRECT 157.1 01/21/2013   TRIG 115.0 11/17/2021   CHOLHDL 3 11/17/2021       Relevant Medications   amLODipine (NORVASC) 5 MG tablet   Hypertension    Continue lisinopril and amlodipine.  Blood pressure as outlined.  Increase amlodipine to 83m bid. Follow pressures.  Follow metabolic panel.       Relevant Medications    amLODipine (NORVASC) 5 MG tablet   Stress    Overall appears to be handling things relatively well.  Doing better.  Discussed increasing buspar to bid.  Follow.        Other Visit Diagnoses     Need for immunization against influenza    -  Primary   Relevant Orders   Flu Vaccine QUAD High Dose(Fluad) (Completed)        CEinar Pheasant MD

## 2022-01-10 NOTE — Patient Instructions (Signed)
Increase amlodipine to 5mg  two times per day  Increase buspar to 5mg  two times per day as needed.

## 2022-01-11 ENCOUNTER — Telehealth: Payer: Self-pay | Admitting: Internal Medicine

## 2022-01-11 NOTE — Telephone Encounter (Signed)
Please notify Gabrielle Yu that her mammogram and bone density are scheduled at [facility] - 03/13/22 - 10:00 am.  They will do both.

## 2022-01-11 NOTE — Telephone Encounter (Signed)
Pt.notified

## 2022-01-15 ENCOUNTER — Encounter: Payer: Self-pay | Admitting: Internal Medicine

## 2022-01-15 NOTE — Assessment & Plan Note (Addendum)
Overall appears to be handling things relatively well.  Doing better.  Discussed increasing buspar to bid.  Follow.

## 2022-01-15 NOTE — Assessment & Plan Note (Signed)
Continues on metformin and jardiance.  Low carb diet and exercise.  Follow met b and a1c.

## 2022-01-15 NOTE — Assessment & Plan Note (Signed)
Continue crestor.  Low cholesterol diet and exercise.  Follow lipid panel and liver function tests.   Lab Results  Component Value Date   CHOL 168 11/17/2021   HDL 48.30 11/17/2021   LDLCALC 97 11/17/2021   LDLDIRECT 157.1 01/21/2013   TRIG 115.0 11/17/2021   CHOLHDL 3 11/17/2021

## 2022-01-15 NOTE — Assessment & Plan Note (Addendum)
Continue lisinopril and amlodipine.  Blood pressure as outlined.  Increase amlodipine to 5mg  bid. Follow pressures.  Follow metabolic panel.

## 2022-01-15 NOTE — Assessment & Plan Note (Signed)
Continue diet and exercise.  Follow lipid panel.  

## 2022-02-10 ENCOUNTER — Other Ambulatory Visit: Payer: Medicare Other

## 2022-02-10 ENCOUNTER — Other Ambulatory Visit: Payer: Self-pay | Admitting: Internal Medicine

## 2022-02-12 ENCOUNTER — Other Ambulatory Visit: Payer: Self-pay | Admitting: Internal Medicine

## 2022-02-23 ENCOUNTER — Other Ambulatory Visit: Payer: Self-pay

## 2022-02-23 ENCOUNTER — Telehealth: Payer: Self-pay | Admitting: Internal Medicine

## 2022-02-23 DIAGNOSIS — E7800 Pure hypercholesterolemia, unspecified: Secondary | ICD-10-CM

## 2022-02-23 DIAGNOSIS — R7989 Other specified abnormal findings of blood chemistry: Secondary | ICD-10-CM

## 2022-02-23 DIAGNOSIS — E1165 Type 2 diabetes mellitus with hyperglycemia: Secondary | ICD-10-CM

## 2022-02-23 DIAGNOSIS — I10 Essential (primary) hypertension: Secondary | ICD-10-CM

## 2022-02-23 NOTE — Telephone Encounter (Signed)
Patient has a lab appt 02/28/2022, there are no orders in. 

## 2022-02-23 NOTE — Telephone Encounter (Signed)
Orders placed.

## 2022-02-23 NOTE — Addendum Note (Signed)
Addended by: Alisa Graff on: 02/23/2022 02:56 PM   Modules accepted: Orders

## 2022-02-23 NOTE — Telephone Encounter (Signed)
Orders placed/signed for labs

## 2022-02-28 ENCOUNTER — Other Ambulatory Visit (INDEPENDENT_AMBULATORY_CARE_PROVIDER_SITE_OTHER): Payer: Medicare Other

## 2022-02-28 DIAGNOSIS — I10 Essential (primary) hypertension: Secondary | ICD-10-CM

## 2022-02-28 DIAGNOSIS — E1165 Type 2 diabetes mellitus with hyperglycemia: Secondary | ICD-10-CM

## 2022-02-28 DIAGNOSIS — E7800 Pure hypercholesterolemia, unspecified: Secondary | ICD-10-CM

## 2022-02-28 LAB — LIPID PANEL
Cholesterol: 132 mg/dL (ref 0–200)
HDL: 53.6 mg/dL (ref >39.00)
LDL Cholesterol: 61 mg/dL (ref 0–99)
NonHDL: 78.62
Total CHOL/HDL Ratio: 2
Triglycerides: 90 mg/dL (ref 0.0–149.0)
VLDL: 18 mg/dL (ref 0.0–40.0)

## 2022-02-28 LAB — BASIC METABOLIC PANEL
BUN: 13 mg/dL (ref 6–23)
CO2: 28 mEq/L (ref 19–32)
Calcium: 9 mg/dL (ref 8.4–10.5)
Chloride: 106 mEq/L (ref 96–112)
Creatinine, Ser: 0.82 mg/dL (ref 0.40–1.20)
GFR: 71.81 mL/min (ref >60.00)
Glucose, Bld: 106 mg/dL — ABNORMAL HIGH (ref 70–99)
Potassium: 4 mEq/L (ref 3.5–5.1)
Sodium: 143 mEq/L (ref 135–145)

## 2022-02-28 LAB — HEPATIC FUNCTION PANEL
ALT: 16 U/L (ref 0–35)
AST: 17 U/L (ref 0–37)
Albumin: 4.3 g/dL (ref 3.5–5.2)
Alkaline Phosphatase: 46 U/L (ref 39–117)
Bilirubin, Direct: 0.1 mg/dL (ref 0.0–0.3)
Total Bilirubin: 0.4 mg/dL (ref 0.2–1.2)
Total Protein: 6.5 g/dL (ref 6.0–8.3)

## 2022-02-28 LAB — TSH: TSH: 4.49 u[IU]/mL (ref 0.35–5.50)

## 2022-02-28 LAB — HEMOGLOBIN A1C: Hgb A1c MFr Bld: 7.3 % — ABNORMAL HIGH (ref 4.6–6.5)

## 2022-03-03 ENCOUNTER — Other Ambulatory Visit: Payer: Medicare Other

## 2022-03-07 ENCOUNTER — Encounter: Payer: Self-pay | Admitting: Internal Medicine

## 2022-03-07 ENCOUNTER — Ambulatory Visit (INDEPENDENT_AMBULATORY_CARE_PROVIDER_SITE_OTHER): Payer: Medicare Other | Admitting: Internal Medicine

## 2022-03-07 VITALS — BP 136/80 | HR 78 | Temp 98.2°F | Resp 17 | Ht 64.0 in | Wt 159.4 lb

## 2022-03-07 DIAGNOSIS — I10 Essential (primary) hypertension: Secondary | ICD-10-CM | POA: Diagnosis not present

## 2022-03-07 DIAGNOSIS — E1165 Type 2 diabetes mellitus with hyperglycemia: Secondary | ICD-10-CM | POA: Diagnosis not present

## 2022-03-07 DIAGNOSIS — R945 Abnormal results of liver function studies: Secondary | ICD-10-CM

## 2022-03-07 DIAGNOSIS — E7800 Pure hypercholesterolemia, unspecified: Secondary | ICD-10-CM

## 2022-03-07 DIAGNOSIS — Z0000 Encounter for general adult medical examination without abnormal findings: Secondary | ICD-10-CM

## 2022-03-07 MED ORDER — TELMISARTAN 80 MG PO TABS: 80.0000 mg | ORAL_TABLET | Freq: Every day | ORAL | 1 refills | Status: DC

## 2022-03-07 MED ORDER — METFORMIN HCL ER 500 MG PO TB24: ORAL_TABLET | ORAL | 1 refills | Status: DC

## 2022-03-07 MED ORDER — AMLODIPINE BESYLATE 5 MG PO TABS: 5.0000 mg | ORAL_TABLET | Freq: Two times a day (BID) | ORAL | 1 refills | Status: DC

## 2022-03-07 NOTE — Assessment & Plan Note (Addendum)
Physical today 11/05/20.  Mammogram 01/29/20 - birads I.  Declines colonoscopy. Discussed again today.  She declines.

## 2022-03-07 NOTE — Progress Notes (Signed)
Patient ID: Gabrielle Yu, female   DOB: [birth year], 71 y.o.   MRN: [id number]   Subjective:    Patient ID: Gabrielle Yu, female    DOB: 1950-12-29, 71 y.o.   MRN: [id number]    HPI With past history of diabetes, hypercholesterolemia and hypertension. Comes in today to follow up on these issues as well as for a complete physical exam. Reports she is doing relatively well.  Tries to stay active.  No chest pain or sob reported.  No cough or congestion.  No abdominal pain.  Some loose stool - relates to metformin.  Discussed changing to XR form.  Reviewed blood pressures - outside checks.  Blood pressures 120-140s/60-80s.     Past Medical History:  Diagnosis Date   Diabetes mellitus ([place])    High cholesterol    Hypercholesterolemia    Hypertension    Past Surgical History:  Procedure Laterality Date   BREAST BIOPSY Left 2014   core with clip   CATARACT EXTRACTION W/PHACO Right 01/24/2017   Procedure: CATARACT EXTRACTION PHACO AND INTRAOCULAR LENS PLACEMENT ([place]) RIGHT DIABETIC COMPLICATED;  Surgeon: Leandrew Koyanagi, MD;  Location: [place];  Service: Ophthalmology;  Laterality: Right;  Diabetic - oral meds   TONSILLECTOMY AND ADENOIDECTOMY  1958   TUBAL LIGATION  1982   Family History  Problem Relation Age of Onset   Breast cancer Mother 18   Hypertension Mother    Heart disease Father        myocardial infarction - died age 64   Heart disease Other        paternal aunts and uncles   Colon cancer Neg Hx    Social History   Socioeconomic History   Marital status: Married    Spouse name: Not on file   Number of children: 2   Years of education: Not on file   Highest education level: Not on file  Occupational History    Employer: [employer]  Tobacco Use   Smoking status: Never   Smokeless tobacco: Never  Vaping Use   Vaping Use: Never used  Substance and Sexual Activity   Alcohol use: No    Alcohol/week: 0.0 standard  drinks of alcohol   Drug use: No   Sexual activity: Not on file  Other Topics Concern   Not on file  Social History Narrative   Not on file   Social Determinants of Health   Financial Resource Strain: Not on file  Food Insecurity: Not on file  Transportation Needs: Not on file  Physical Activity: Not on file  Stress: Not on file  Social Connections: Not on file     Review of Systems  Constitutional:  Negative for appetite change and unexpected weight change.  HENT:  Negative for congestion, sinus pressure and sore throat.   Eyes:  Negative for pain and visual disturbance.  Respiratory:  Negative for cough, chest tightness and shortness of breath.   Cardiovascular:  Negative for chest pain, palpitations and leg swelling.  Gastrointestinal:  Negative for abdominal pain, diarrhea, nausea and vomiting.  Genitourinary:  Negative for difficulty urinating and dysuria.  Musculoskeletal:  Negative for joint swelling and myalgias.  Skin:  Negative for color change and rash.  Neurological:  Negative for dizziness, light-headedness and headaches.  Hematological:  Negative for adenopathy. Does not bruise/bleed easily.  Psychiatric/Behavioral:  Negative for agitation and dysphoric mood.        Objective:     BP 136/80 (BP Location:  Left Arm, Patient Position: Sitting, Cuff Size: Small)   Pulse 78   Temp 98.2 F (36.8 C) (Temporal)   Resp 17   Ht _0  (1.626 m)   Wt 159 lb 6.4 oz (72.3 kg)   SpO2 99%   BMI 27.36 kg/m  Wt Readings from Last 3 Encounters:  03/07/22 159 lb 6.4 oz (72.3 kg)  01/10/22 158 lb 3.2 oz (71.8 kg)  11/17/21 158 lb 12.8 oz (72 kg)    Physical Exam Vitals reviewed.  Constitutional:      General: She is not in acute distress.    Appearance: Normal appearance. She is well-developed.  HENT:     Head: Normocephalic and atraumatic.     Right Ear: External ear normal.     Left Ear: External ear normal.  Eyes:     General: No scleral icterus.        Right eye: No discharge.        Left eye: No discharge.     Conjunctiva/sclera: Conjunctivae normal.  Neck:     Thyroid: No thyromegaly.  Cardiovascular:     Rate and Rhythm: Normal rate and regular rhythm.  Pulmonary:     Effort: No tachypnea, accessory muscle usage or respiratory distress.     Breath sounds: Normal breath sounds. No decreased breath sounds or wheezing.  Chest:  Breasts:    Right: No inverted nipple, mass, nipple discharge or tenderness (no axillary adenopathy).     Left: No inverted nipple, mass, nipple discharge or tenderness (no axilarry adenopathy).  Abdominal:     General: Bowel sounds are normal.     Palpations: Abdomen is soft.     Tenderness: There is no abdominal tenderness.  Musculoskeletal:        General: No swelling or tenderness.     Cervical back: Neck supple.  Lymphadenopathy:     Cervical: No cervical adenopathy.  Skin:    Findings: No erythema or rash.  Neurological:     Mental Status: She is alert and oriented to person, place, and time.  Psychiatric:        Mood and Affect: Mood normal.        Behavior: Behavior normal.      Outpatient Encounter Medications as of 03/07/2022  Medication Sig   busPIRone (BUSPAR) 5 MG tablet TAKE 1 TABLET BY MOUTH EVERY DAY   JARDIANCE 25 MG TABS tablet TAKE 1 TABLET BY MOUTH EVERY DAY   metFORMIN (GLUCOPHAGE-XR) 500 MG 24 hr tablet Take 2 tablets bid   metroNIDAZOLE (METROGEL) 1 % gel APPLY TO AFFECTED AREA TOPICALLY EVERY DAY   rosuvastatin (CRESTOR) 20 MG tablet Take 1 tablet (20 mg total) by mouth daily.   telmisartan (MICARDIS) 80 MG tablet Take 1 tablet (80 mg total) by mouth daily.   [DISCONTINUED] amLODipine (NORVASC) 5 MG tablet Take 1 tablet (5 mg total) by mouth 2 (two) times daily.   [DISCONTINUED] lisinopril (ZESTRIL) 40 MG tablet TAKE 1 TABLET(40 MG) BY MOUTH DAILY   [DISCONTINUED] metFORMIN (GLUCOPHAGE) 500 MG tablet Take 2 tablets (1,000 mg total) by mouth 2 (two) times daily with a meal.    amLODipine (NORVASC) 5 MG tablet Take 1 tablet (5 mg total) by mouth 2 (two) times daily.   No facility-administered encounter medications on file as of 03/07/2022.     Lab Results  Component Value Date   WBC 5.7 11/17/2021   HGB 12.7 11/17/2021   HCT 39.2 11/17/2021   PLT 209.0 11/17/2021  GLUCOSE 106 (H) 02/28/2022   CHOL 132 02/28/2022   TRIG 90.0 02/28/2022   HDL 53.60 02/28/2022   LDLDIRECT 157.1 01/21/2013   LDLCALC 61 02/28/2022   ALT 16 02/28/2022   AST 17 02/28/2022   NA 143 02/28/2022   K 4.0 02/28/2022   CL 106 02/28/2022   CREATININE 0.82 02/28/2022   BUN 13 02/28/2022   CO2 28 02/28/2022   TSH 4.49 02/28/2022   HGBA1C 7.3 (H) 02/28/2022   MICROALBUR 1.2 11/17/2021    MM 3D SCREEN BREAST BILATERAL  Result Date: 03/09/2021 CLINICAL DATA:  Screening. EXAM: DIGITAL SCREENING BILATERAL MAMMOGRAM WITH TOMOSYNTHESIS AND CAD TECHNIQUE: Bilateral screening digital craniocaudal and mediolateral oblique mammograms were obtained. Bilateral screening digital breast tomosynthesis was performed. The images were evaluated with computer-aided detection. COMPARISON:  Previous exam(s). ACR Breast Density Category b: There are scattered areas of fibroglandular density. FINDINGS: There are no findings suspicious for malignancy. IMPRESSION: No mammographic evidence of malignancy. A result letter of this screening mammogram will be mailed directly to the patient. RECOMMENDATION: Screening mammogram in one year. (Code:SM-B-01Y) BI-RADS CATEGORY  1: Negative. Electronically Signed   By: Lillia Mountain M.D.   On: 03/09/2021 11:54      Assessment & Plan:   Problem List Items Addressed This Visit     Abnormal liver function    Continue diet and exercise.  Follow lipid panel.       Diabetes mellitus ([place])    Continues on metformin and jardiance.  Will change metformin to XR.  Follow sugars.  Follow bowel issues.  Call with update. Low carb diet and exercise.  Follow met b and a1c.        Relevant Medications   metFORMIN (GLUCOPHAGE-XR) 500 MG 24 hr tablet   telmisartan (MICARDIS) 80 MG tablet   Other Relevant Orders   HgB A1c   Health care maintenance    Physical today 11/05/20.  Mammogram 01/29/20 - birads I.  Declines colonoscopy. Discussed again today.  She declines.       Hypercholesteremia    Continue crestor.  Low cholesterol diet and exercise.  Follow lipid panel and liver function tests.   Lab Results  Component Value Date   CHOL 132 02/28/2022   HDL 53.60 02/28/2022   LDLCALC 61 02/28/2022   LDLDIRECT 157.1 01/21/2013   TRIG 90.0 02/28/2022   CHOLHDL 2 02/28/2022       Relevant Medications   amLODipine (NORVASC) 5 MG tablet   telmisartan (MICARDIS) 80 MG tablet   Other Relevant Orders   Lipid Profile   Hepatic function panel   Hypertension - Primary    Currently on lisinopril and amlodipine 14m bid.   Blood pressure as outlined.  Persistent elevation.  Change lisinopril to micardis 839m Follow pressures.  Follow metabolic panel.  Send in readings.  If persistent elevation, will require additional medication.       Relevant Medications   amLODipine (NORVASC) 5 MG tablet   telmisartan (MICARDIS) 80 MG tablet   Other Relevant Orders   Basic Metabolic Panel (BMET)     ChEinar PheasantMD

## 2022-03-07 NOTE — Patient Instructions (Signed)
Stop lisinopril.   Start micardis 80mg  - one per day  Stop metformin.   Start metformin XR 500mg  - take 2 tablets twice a day.

## 2022-03-13 ENCOUNTER — Ambulatory Visit
Admission: RE | Admit: 2022-03-13 | Discharge: 2022-03-13 | Disposition: A | Discharge status: Home / Self Care | Payer: Medicare Other | Source: Ambulatory Visit | Attending: Internal Medicine | Admitting: Internal Medicine

## 2022-03-13 DIAGNOSIS — E2839 Other primary ovarian failure: Secondary | ICD-10-CM | POA: Diagnosis present

## 2022-03-13 DIAGNOSIS — Z1231 Encounter for screening mammogram for malignant neoplasm of breast: Secondary | ICD-10-CM | POA: Diagnosis present

## 2022-03-14 ENCOUNTER — Telehealth: Payer: Self-pay

## 2022-03-14 NOTE — Telephone Encounter (Signed)
LM FOR PT TO CB  Please call and notify - bone density is normal.

## 2022-03-19 ENCOUNTER — Encounter: Payer: Self-pay | Admitting: Internal Medicine

## 2022-03-19 NOTE — Assessment & Plan Note (Signed)
Continue crestor.  Low cholesterol diet and exercise.  Follow lipid panel and liver function tests.   Lab Results  Component Value Date   CHOL 132 02/28/2022   HDL 53.60 02/28/2022   LDLCALC 61 02/28/2022   LDLDIRECT 157.1 01/21/2013   TRIG 90.0 02/28/2022   CHOLHDL 2 02/28/2022   

## 2022-03-19 NOTE — Assessment & Plan Note (Signed)
Continues on metformin and jardiance.  Will change metformin to XR.  Follow sugars.  Follow bowel issues.  Call with update. Low carb diet and exercise.  Follow met b and a1c.

## 2022-03-19 NOTE — Assessment & Plan Note (Signed)
Continue diet and exercise.  Follow lipid panel.  

## 2022-03-19 NOTE — Assessment & Plan Note (Signed)
Currently on lisinopril and amlodipine 5mg  bid.   Blood pressure as outlined.  Persistent elevation.  Change lisinopril to micardis 80mg . Follow pressures.  Follow metabolic panel.  Send in readings.  If persistent elevation, will require additional medication.

## 2022-04-28 ENCOUNTER — Encounter: Payer: Self-pay | Admitting: Internal Medicine

## 2022-04-28 ENCOUNTER — Ambulatory Visit (INDEPENDENT_AMBULATORY_CARE_PROVIDER_SITE_OTHER): Payer: Medicare Other | Admitting: Internal Medicine

## 2022-04-28 VITALS — BP 132/76 | HR 65 | Temp 97.7°F | Ht 64.0 in | Wt 156.6 lb

## 2022-04-28 DIAGNOSIS — E1165 Type 2 diabetes mellitus with hyperglycemia: Secondary | ICD-10-CM

## 2022-04-28 DIAGNOSIS — E7800 Pure hypercholesterolemia, unspecified: Secondary | ICD-10-CM

## 2022-04-28 DIAGNOSIS — R945 Abnormal results of liver function studies: Secondary | ICD-10-CM | POA: Diagnosis not present

## 2022-04-28 DIAGNOSIS — F439 Reaction to severe stress, unspecified: Secondary | ICD-10-CM

## 2022-04-28 DIAGNOSIS — I10 Essential (primary) hypertension: Secondary | ICD-10-CM | POA: Diagnosis not present

## 2022-04-28 MED ORDER — METRONIDAZOLE 1 % EX GEL: CUTANEOUS | 0 refills | Status: DC

## 2022-04-28 NOTE — Progress Notes (Unsigned)
Subjective:    Patient ID: Gabrielle Yu, female    DOB: 09-05-1950, 72 y.o.   MRN: [id number]  Patient here for  Chief Complaint  Patient presents with   Medical Management of Chronic Issues   Hypertension    HPI Here to follow up regarding her diabetes, hypertension and hypercholesterolemia.  Changed to metformin XR last visit - due to loose stool.  Bowels have improved.  Not a significant issue for her now. Had mammogram 03/15/22 - ok.  Bone density normal.  Stays active.  No chest pain or shortness of breath reported.  No abdominal pain.  Reviewed outside blood pressure reading.  Blood pressures averaging 118-135/70s.  Occasionally will pop up into the 140s range.    Past Medical History:  Diagnosis Date   Diabetes mellitus ([place])    High cholesterol    Hypercholesterolemia    Hypertension    Past Surgical History:  Procedure Laterality Date   BREAST BIOPSY Left 2014   core with clip   CATARACT EXTRACTION W/PHACO Right 01/24/2017   Procedure: CATARACT EXTRACTION PHACO AND INTRAOCULAR LENS PLACEMENT ([place]) RIGHT DIABETIC COMPLICATED;  Surgeon: Leandrew Koyanagi, MD;  Location: [place];  Service: Ophthalmology;  Laterality: Right;  Diabetic - oral meds   TONSILLECTOMY AND ADENOIDECTOMY  1958   TUBAL LIGATION  1982   Family History  Problem Relation Age of Onset   Breast cancer Mother 18   Hypertension Mother    Heart disease Father        myocardial infarction - died age 42   Heart disease Other        paternal aunts and uncles   Colon cancer Neg Hx    Social History   Socioeconomic History   Marital status: Married    Spouse name: Not on file   Number of children: 2   Years of education: Not on file   Highest education level: Not on file  Occupational History    Employer: [employer]  Tobacco Use   Smoking status: Never   Smokeless tobacco: Never  Vaping Use   Vaping Use: Never used  Substance and Sexual Activity    Alcohol use: No    Alcohol/week: 0.0 standard drinks of alcohol   Drug use: No   Sexual activity: Not on file  Other Topics Concern   Not on file  Social History Narrative   Not on file   Social Determinants of Health   Financial Resource Strain: Not on file  Food Insecurity: Not on file  Transportation Needs: Not on file  Physical Activity: Not on file  Stress: Not on file  Social Connections: Not on file     Review of Systems  Constitutional:  Negative for appetite change and unexpected weight change.  HENT:  Negative for congestion and sinus pressure.   Respiratory:  Negative for cough, chest tightness and shortness of breath.   Cardiovascular:  Negative for chest pain, palpitations and leg swelling.  Gastrointestinal:  Negative for abdominal pain, diarrhea, nausea and vomiting.  Genitourinary:  Negative for difficulty urinating and dysuria.  Musculoskeletal:  Negative for joint swelling and myalgias.  Skin:  Negative for color change and rash.  Neurological:  Negative for dizziness, light-headedness and headaches.  Psychiatric/Behavioral:  Negative for agitation and dysphoric mood.        Objective:     There were no vitals taken for this visit. Wt Readings from Last 3 Encounters:  03/07/22 159 lb 6.4 oz (  72.3 kg)  01/10/22 158 lb 3.2 oz (71.8 kg)  11/17/21 158 lb 12.8 oz (72 kg)    Physical Exam Constitutional:      General: She is not in acute distress. HENT:     Head: Normocephalic and atraumatic.     Right Ear: External ear normal.     Left Ear: External ear normal.     Mouth/Throat:     Pharynx: No oropharyngeal exudate or posterior oropharyngeal erythema.  Neck:     Thyroid: No thyromegaly.  Cardiovascular:     Rate and Rhythm: Normal rate and regular rhythm.  Pulmonary:     Effort: No respiratory distress.     Breath sounds: Normal breath sounds. No wheezing.  Abdominal:     General: Bowel sounds are normal.     Palpations: Abdomen is soft.      Tenderness: There is no abdominal tenderness.  Musculoskeletal:        General: No swelling or tenderness.     Cervical back: Neck supple. No tenderness.  Lymphadenopathy:     Cervical: No cervical adenopathy.  Skin:    Findings: No erythema or rash.  Neurological:     Mental Status: She is alert.  Psychiatric:        Mood and Affect: Mood normal.        Behavior: Behavior normal.      Outpatient Encounter Medications as of 04/28/2022  Medication Sig   amLODipine (NORVASC) 5 MG tablet Take 1 tablet (5 mg total) by mouth 2 (two) times daily.   busPIRone (BUSPAR) 5 MG tablet TAKE 1 TABLET BY MOUTH EVERY DAY   JARDIANCE 25 MG TABS tablet TAKE 1 TABLET BY MOUTH EVERY DAY   metFORMIN (GLUCOPHAGE-XR) 500 MG 24 hr tablet Take 2 tablets bid   metroNIDAZOLE (METROGEL) 1 % gel APPLY TO AFFECTED AREA TOPICALLY EVERY DAY   rosuvastatin (CRESTOR) 20 MG tablet Take 1 tablet (20 mg total) by mouth daily.   telmisartan (MICARDIS) 80 MG tablet Take 1 tablet (80 mg total) by mouth daily.   No facility-administered encounter medications on file as of 04/28/2022.     Lab Results  Component Value Date   WBC 5.7 11/17/2021   HGB 12.7 11/17/2021   HCT 39.2 11/17/2021   PLT 209.0 11/17/2021   GLUCOSE 106 (H) 02/28/2022   CHOL 132 02/28/2022   TRIG 90.0 02/28/2022   HDL 53.60 02/28/2022   LDLDIRECT 157.1 01/21/2013   LDLCALC 61 02/28/2022   ALT 16 02/28/2022   AST 17 02/28/2022   NA 143 02/28/2022   K 4.0 02/28/2022   CL 106 02/28/2022   CREATININE 0.82 02/28/2022   BUN 13 02/28/2022   CO2 28 02/28/2022   TSH 4.49 02/28/2022   HGBA1C 7.3 (H) 02/28/2022   MICROALBUR 1.2 11/17/2021    MM 3D SCREEN BREAST BILATERAL  Result Date: 03/15/2022 CLINICAL DATA:  Screening. EXAM: DIGITAL SCREENING BILATERAL MAMMOGRAM WITH TOMOSYNTHESIS AND CAD TECHNIQUE: Bilateral screening digital craniocaudal and mediolateral oblique mammograms were obtained. Bilateral screening digital breast tomosynthesis was  performed. The images were evaluated with computer-aided detection. COMPARISON:  Previous exam(s). ACR Breast Density Category b: There are scattered areas of fibroglandular density. FINDINGS: There are no findings suspicious for malignancy. IMPRESSION: No mammographic evidence of malignancy. A result letter of this screening mammogram will be mailed directly to the patient. RECOMMENDATION: Screening mammogram in one year. (Code:SM-B-01Y) BI-RADS CATEGORY  1: Negative. Electronically Signed   By: Zerita Boers M.D.   On:  03/15/2022 14:43   DG Bone Density  Result Date: 03/13/2022 EXAM: DUAL X-RAY ABSORPTIOMETRY (DXA) FOR BONE MINERAL DENSITY IMPRESSION: Your patient Gabrielle Yu completed a BMD test on 03/13/2022 using the [employer] iDXA DXA System (software version: 14.10) manufactured by [employer]. The following summarizes the results of our evaluation. Technologist::TNB PATIENT BIOGRAPHICAL: Name: Gabrielle Yu, Gabrielle Yu Patient ID: [id number] Birth Date: 01/12/1951 Height: 64.0 in. Gender: Female Exam Date: 03/13/2022 Weight: 159.4 lbs. Indications: Caucasian, Diabetic, Low Calcium Intake, Postmenopausal Fractures: Treatments: DENSITOMETRY RESULTS: Site      Region     Measured Date Measured Age WHO Classification Young Adult T-score BMD         %Change vs. Previous Significant Change (*) AP Spine L1-L4 03/13/2022 71.7 Normal 1.0 1.316 g/cm2 - - DualFemur Neck Right 03/13/2022 71.7 Normal -0.5 0.963 g/cm2 - - ASSESSMENT: The BMD measured at Femur Neck Right is 0.963 g/cm2 with a T-score of -0.5. This patient is considered normal according to World Health Organization [facility]) criteria. Patient is not a candidate for FRAX due to normal bone density exam. The scan quality is good. World Science writer [facility]) criteria for post-menopausal, Caucasian Women: Normal:                   T-score at or above -1 SD Osteopenia/low bone mass: T-score between -1 and -2.5 SD Osteoporosis:             T-score at or below  -2.5 SD RECOMMENDATIONS: 1. All patients should optimize calcium and vitamin D intake. 2. Consider FDA-approved medical therapies in postmenopausal women and men aged 38 years and older, based on the following: a. A hip or vertebral(clinical or morphometric) fracture b. T-score < -2.5 at the femoral neck or spine after appropriate evaluation to exclude secondary causes c. Low bone mass (T-score between -1.0 and -2.5 at the femoral neck or spine) and a 10-year probability of a hip fracture > 3% or a 10-year probability of a major osteoporosis-related fracture > 20% based on the [place]-adapted WHO algorithm 3. Clinician judgment and/or patient preferences may indicate treatment for people with 10-year fracture probabilities above or below these levels FOLLOW-UP: People with diagnosed cases of osteoporosis or at high risk for fracture should have regular bone mineral density tests. For patients eligible for Medicare, routine testing is allowed once every 2 years. The testing frequency can be increased to one year for patients who have rapidly progressing disease, those who are receiving or discontinuing medical therapy to restore bone mass, or have additional risk factors. I have reviewed this report, and agree with the above findings. [facility] Electronically Signed   By: Frederico Hamman M.D.   On: 03/13/2022 10:44       Assessment & Plan:  There are no diagnoses linked to this encounter.   Dale [place], MD

## 2022-04-30 NOTE — Assessment & Plan Note (Signed)
Doing well on metformin XR.  Bowels better.  Continues jardiance.  Low carb diet and exercise.  Follow met b and A1c.  

## 2022-04-30 NOTE — Assessment & Plan Note (Signed)
Continue diet and exercise.  Follow liver function tests.   

## 2022-04-30 NOTE — Assessment & Plan Note (Signed)
Continue crestor.  Low cholesterol diet and exercise.  Follow lipid panel and liver function tests.   Lab Results  Component Value Date   CHOL 132 02/28/2022   HDL 53.60 02/28/2022   LDLCALC 61 02/28/2022   LDLDIRECT 157.1 01/21/2013   TRIG 90.0 02/28/2022   CHOLHDL 2 02/28/2022

## 2022-04-30 NOTE — Assessment & Plan Note (Signed)
Overall appears to be handling things relatively well.  Doing better.  Follow.

## 2022-04-30 NOTE — Assessment & Plan Note (Signed)
On micardis and amlodipine 5mg  bid.   Blood pressure as outlined.  Follow pressures.  Follow metabolic panel.  Send in readings.

## 2022-06-23 ENCOUNTER — Other Ambulatory Visit (INDEPENDENT_AMBULATORY_CARE_PROVIDER_SITE_OTHER): Payer: Medicare Other

## 2022-06-23 DIAGNOSIS — I10 Essential (primary) hypertension: Secondary | ICD-10-CM | POA: Diagnosis not present

## 2022-06-23 DIAGNOSIS — E7800 Pure hypercholesterolemia, unspecified: Secondary | ICD-10-CM

## 2022-06-23 DIAGNOSIS — E1165 Type 2 diabetes mellitus with hyperglycemia: Secondary | ICD-10-CM | POA: Diagnosis not present

## 2022-06-23 LAB — BASIC METABOLIC PANEL
BUN: 15 mg/dL (ref 6–23)
CO2: 28 mEq/L (ref 19–32)
Calcium: 9.4 mg/dL (ref 8.4–10.5)
Chloride: 107 mEq/L (ref 96–112)
Creatinine, Ser: 0.87 mg/dL (ref 0.40–1.20)
GFR: 66.74 mL/min (ref >60.00)
Glucose, Bld: 88 mg/dL (ref 70–99)
Potassium: 3.9 mEq/L (ref 3.5–5.1)
Sodium: 144 mEq/L (ref 135–145)

## 2022-06-23 LAB — HEPATIC FUNCTION PANEL
ALT: 15 U/L (ref 0–35)
AST: 16 U/L (ref 0–37)
Albumin: 4.3 g/dL (ref 3.5–5.2)
Alkaline Phosphatase: 45 U/L (ref 39–117)
Bilirubin, Direct: 0.1 mg/dL (ref 0.0–0.3)
Total Bilirubin: 0.4 mg/dL (ref 0.2–1.2)
Total Protein: 6.8 g/dL (ref 6.0–8.3)

## 2022-06-23 LAB — LIPID PANEL
Cholesterol: 112 mg/dL (ref 0–200)
HDL: 43.1 mg/dL (ref >39.00)
LDL Cholesterol: 53 mg/dL (ref 0–99)
NonHDL: 68.71
Total CHOL/HDL Ratio: 3
Triglycerides: 80 mg/dL (ref 0.0–149.0)
VLDL: 16 mg/dL (ref 0.0–40.0)

## 2022-06-23 LAB — HEMOGLOBIN A1C: Hgb A1c MFr Bld: 7 % — ABNORMAL HIGH (ref 4.6–6.5)

## 2022-06-27 ENCOUNTER — Ambulatory Visit (INDEPENDENT_AMBULATORY_CARE_PROVIDER_SITE_OTHER): Payer: Medicare Other | Admitting: Internal Medicine

## 2022-06-27 ENCOUNTER — Encounter: Payer: Self-pay | Admitting: Internal Medicine

## 2022-06-27 VITALS — BP 136/78 | HR 88 | Temp 97.9°F | Resp 16 | Ht 64.0 in | Wt 155.0 lb

## 2022-06-27 DIAGNOSIS — F439 Reaction to severe stress, unspecified: Secondary | ICD-10-CM

## 2022-06-27 DIAGNOSIS — I10 Essential (primary) hypertension: Secondary | ICD-10-CM

## 2022-06-27 DIAGNOSIS — E1165 Type 2 diabetes mellitus with hyperglycemia: Secondary | ICD-10-CM

## 2022-06-27 DIAGNOSIS — E7800 Pure hypercholesterolemia, unspecified: Secondary | ICD-10-CM

## 2022-06-27 DIAGNOSIS — R945 Abnormal results of liver function studies: Secondary | ICD-10-CM | POA: Diagnosis not present

## 2022-06-27 MED ORDER — TELMISARTAN 80 MG PO TABS: 80.0000 mg | ORAL_TABLET | Freq: Every day | ORAL | 1 refills | Status: DC

## 2022-06-27 MED ORDER — BUSPIRONE HCL 5 MG PO TABS: 5.0000 mg | ORAL_TABLET | Freq: Every day | ORAL | 1 refills | Status: DC

## 2022-06-27 MED ORDER — HYDROCHLOROTHIAZIDE 12.5 MG PO CAPS: 12.5000 mg | ORAL_CAPSULE | Freq: Every day | ORAL | 2 refills | Status: DC

## 2022-06-27 MED ORDER — GLUCOSE BLOOD VI STRP: ORAL_STRIP | 12 refills | Status: DC

## 2022-06-27 MED ORDER — METFORMIN HCL ER 500 MG PO TB24: ORAL_TABLET | ORAL | 1 refills | Status: DC

## 2022-06-27 MED ORDER — AMLODIPINE BESYLATE 5 MG PO TABS: 5.0000 mg | ORAL_TABLET | Freq: Two times a day (BID) | ORAL | 1 refills | Status: DC

## 2022-06-27 NOTE — Progress Notes (Signed)
Subjective:    Patient ID: Gabrielle Yu, female    DOB: April 01, 1951, 72 y.o.   MRN: [id number]  Patient here for  Chief Complaint  Patient presents with   Medical Management of Chronic Issues    HPI Here to follow up regarding her diabetes, hypertension and hypercholesterolemia. Reports she is doing well.  Feels good.  Trying to stay active.  No chest pain or sob reported.  No abdominal pain reported.  Had changed to metformin XR previously.  Bowels better.  Reviewed outside blood pressure readings - most averaging 120s - low 130s/60-70s.  Discussed overdue eye exam.    Past Medical History:  Diagnosis Date   Diabetes mellitus ([place])    High cholesterol    Hypercholesterolemia    Hypertension    Past Surgical History:  Procedure Laterality Date   BREAST BIOPSY Left 2014   core with clip   CATARACT EXTRACTION W/PHACO Right 01/24/2017   Procedure: CATARACT EXTRACTION PHACO AND INTRAOCULAR LENS PLACEMENT ([place]) RIGHT DIABETIC COMPLICATED;  Surgeon: Leandrew Koyanagi, MD;  Location: [place];  Service: Ophthalmology;  Laterality: Right;  Diabetic - oral meds   TONSILLECTOMY AND ADENOIDECTOMY  1958   TUBAL LIGATION  1982   Family History  Problem Relation Age of Onset   Breast cancer Mother 69   Hypertension Mother    Heart disease Father        myocardial infarction - died age 46   Heart disease Other        paternal aunts and uncles   Colon cancer Neg Hx    Social History   Socioeconomic History   Marital status: Married    Spouse name: Not on file   Number of children: 2   Years of education: Not on file   Highest education level: Not on file  Occupational History    Employer: [employer]  Tobacco Use   Smoking status: Never   Smokeless tobacco: Never  Vaping Use   Vaping Use: Never used  Substance and Sexual Activity   Alcohol use: No    Alcohol/week: 0.0 standard drinks of alcohol   Drug use: No   Sexual activity:  Not on file  Other Topics Concern   Not on file  Social History Narrative   Not on file   Social Determinants of Health   Financial Resource Strain: Not on file  Food Insecurity: Not on file  Transportation Needs: Not on file  Physical Activity: Not on file  Stress: Not on file  Social Connections: Not on file     Review of Systems  Constitutional:  Negative for appetite change and unexpected weight change.  HENT:  Negative for congestion and sinus pressure.   Respiratory:  Negative for cough, chest tightness and shortness of breath.   Cardiovascular:  Negative for chest pain and palpitations.  Gastrointestinal:  Negative for abdominal pain, diarrhea, nausea and vomiting.  Genitourinary:  Negative for difficulty urinating and dysuria.  Musculoskeletal:  Negative for joint swelling and myalgias.  Skin:  Negative for color change and rash.  Neurological:  Negative for dizziness and headaches.  Psychiatric/Behavioral:  Negative for agitation and dysphoric mood.        Objective:     BP 136/78   Pulse 88   Temp 97.9 F (36.6 C)   Resp 16   Ht 5\' 4"  (1.626 m)   Wt 155 lb (70.3 kg)   SpO2 98%   BMI 26.61 kg/m  Wt  Readings from Last 3 Encounters:  06/27/22 155 lb (70.3 kg)  04/28/22 156 lb 9.6 oz (71 kg)  03/07/22 159 lb 6.4 oz (72.3 kg)    Physical Exam Vitals reviewed.  Constitutional:      General: She is not in acute distress.    Appearance: Normal appearance.  HENT:     Head: Normocephalic and atraumatic.     Right Ear: External ear normal.     Left Ear: External ear normal.  Eyes:     General: No scleral icterus.       Right eye: No discharge.        Left eye: No discharge.     Conjunctiva/sclera: Conjunctivae normal.  Neck:     Thyroid: No thyromegaly.  Cardiovascular:     Rate and Rhythm: Normal rate and regular rhythm.  Pulmonary:     Effort: No respiratory distress.     Breath sounds: Normal breath sounds. No wheezing.  Abdominal:      General: Bowel sounds are normal.     Palpations: Abdomen is soft.     Tenderness: There is no abdominal tenderness.  Musculoskeletal:        General: No swelling or tenderness.     Cervical back: Neck supple. No tenderness.  Lymphadenopathy:     Cervical: No cervical adenopathy.  Skin:    Findings: No erythema or rash.  Neurological:     Mental Status: She is alert.  Psychiatric:        Mood and Affect: Mood normal.        Behavior: Behavior normal.      Outpatient Encounter Medications as of 06/27/2022  Medication Sig   glucose blood test strip Use as instructed to check blood sugars once daily   hydrochlorothiazide (MICROZIDE) 12.5 MG capsule Take 1 capsule (12.5 mg total) by mouth daily.   amLODipine (NORVASC) 5 MG tablet Take 1 tablet (5 mg total) by mouth 2 (two) times daily.   busPIRone (BUSPAR) 5 MG tablet Take 1 tablet (5 mg total) by mouth daily.   JARDIANCE 25 MG TABS tablet TAKE 1 TABLET BY MOUTH EVERY DAY   metFORMIN (GLUCOPHAGE-XR) 500 MG 24 hr tablet Take 2 tablets bid   metroNIDAZOLE (METROGEL) 1 % gel APPLY TO AFFECTED AREA TOPICALLY EVERY DAY   rosuvastatin (CRESTOR) 20 MG tablet Take 1 tablet (20 mg total) by mouth daily.   telmisartan (MICARDIS) 80 MG tablet Take 1 tablet (80 mg total) by mouth daily.   [DISCONTINUED] amLODipine (NORVASC) 5 MG tablet Take 1 tablet (5 mg total) by mouth 2 (two) times daily.   [DISCONTINUED] busPIRone (BUSPAR) 5 MG tablet TAKE 1 TABLET BY MOUTH EVERY DAY   [DISCONTINUED] metFORMIN (GLUCOPHAGE-XR) 500 MG 24 hr tablet Take 2 tablets bid   [DISCONTINUED] telmisartan (MICARDIS) 80 MG tablet Take 1 tablet (80 mg total) by mouth daily.   No facility-administered encounter medications on file as of 06/27/2022.     Lab Results  Component Value Date   WBC 5.7 11/17/2021   HGB 12.7 11/17/2021   HCT 39.2 11/17/2021   PLT 209.0 11/17/2021   GLUCOSE 88 06/23/2022   CHOL 112 06/23/2022   TRIG 80.0 06/23/2022   HDL 43.10 06/23/2022    LDLDIRECT 157.1 01/21/2013   LDLCALC 53 06/23/2022   ALT 15 06/23/2022   AST 16 06/23/2022   NA 144 06/23/2022   K 3.9 06/23/2022   CL 107 06/23/2022   CREATININE 0.87 06/23/2022   BUN 15 06/23/2022  CO2 28 06/23/2022   TSH 4.49 02/28/2022   HGBA1C 7.0 (H) 06/23/2022   MICROALBUR 1.2 11/17/2021    MM 3D SCREEN BREAST BILATERAL  Result Date: 03/15/2022 CLINICAL DATA:  Screening. EXAM: DIGITAL SCREENING BILATERAL MAMMOGRAM WITH TOMOSYNTHESIS AND CAD TECHNIQUE: Bilateral screening digital craniocaudal and mediolateral oblique mammograms were obtained. Bilateral screening digital breast tomosynthesis was performed. The images were evaluated with computer-aided detection. COMPARISON:  Previous exam(s). ACR Breast Density Category b: There are scattered areas of fibroglandular density. FINDINGS: There are no findings suspicious for malignancy. IMPRESSION: No mammographic evidence of malignancy. A result letter of this screening mammogram will be mailed directly to the patient. RECOMMENDATION: Screening mammogram in one year. (Code:SM-B-01Y) BI-RADS CATEGORY  1: Negative. Electronically Signed   By: Zerita Boers M.D.   On: 03/15/2022 14:43   DG Bone Density  Result Date: 03/13/2022 EXAM: DUAL X-RAY ABSORPTIOMETRY (DXA) FOR BONE MINERAL DENSITY IMPRESSION: Your patient Gabrielle Yu completed a BMD test on 03/13/2022 using the [place] (software version: 14.10) manufactured by [employer]. The following summarizes the results of our evaluation. Technologist::TNB PATIENT BIOGRAPHICAL: Name: Gabrielle Yu, Gabrielle Yu Patient ID: [id number] Birth Date: Nov 13, 1950 Height: 64.0 in. Gender: Female Exam Date: 03/13/2022 Weight: 159.4 lbs. Indications: Caucasian, Diabetic, Low Calcium Intake, Postmenopausal Fractures: Treatments: DENSITOMETRY RESULTS: Site      Region     Measured Date Measured Age WHO Classification Young Adult T-score BMD         %Change vs. Previous Significant Change (*)  AP Spine L1-L4 03/13/2022 71.7 Normal 1.0 1.316 g/cm2 - - DualFemur Neck Right 03/13/2022 71.7 Normal -0.5 0.963 g/cm2 - - ASSESSMENT: The BMD measured at Femur Neck Right is 0.963 g/cm2 with a T-score of -0.5. This patient is considered normal according to [facility]) criteria. Patient is not a candidate for FRAX due to normal bone density exam. The scan quality is good. World Pharmacologist [facility]) criteria for post-menopausal, Caucasian Women: Normal:                   T-score at or above -1 SD Osteopenia/low bone mass: T-score between -1 and -2.5 SD Osteoporosis:             T-score at or below -2.5 SD RECOMMENDATIONS: 1. All patients should optimize calcium and vitamin D intake. 2. Consider FDA-approved medical therapies in postmenopausal women and men aged 26 years and older, based on the following: a. A hip or vertebral(clinical or morphometric) fracture b. T-score < -2.5 at the femoral neck or spine after appropriate evaluation to exclude secondary causes c. Low bone mass (T-score between -1.0 and -2.5 at the femoral neck or spine) and a 10-year probability of a hip fracture > 3% or a 10-year probability of a major osteoporosis-related fracture > 20% based on the [place]-adapted WHO algorithm 3. Clinician judgment and/or patient preferences may indicate treatment for people with 10-year fracture probabilities above or below these levels FOLLOW-UP: People with diagnosed cases of osteoporosis or at high risk for fracture should have regular bone mineral density tests. For patients eligible for Medicare, routine testing is allowed once every 2 years. The testing frequency can be increased to one year for patients who have rapidly progressing disease, those who are receiving or discontinuing medical therapy to restore bone mass, or have additional risk factors. I have reviewed this report, and agree with the above findings. [facility] Electronically Signed   By: Ammie Ferrier M.D.  On: 03/13/2022 10:44       Assessment & Plan:  Type 2 diabetes mellitus with hyperglycemia, without long-term current use of insulin (HCC) Assessment & Plan: Doing well on metformin XR.  Bowels better.  Continues jardiance.  Low carb diet and exercise.  Follow met b and A1c.   Orders: -     Hemoglobin A1c; Future -     Microalbumin / creatinine urine ratio; Future -     Basic metabolic panel; Future  Hypercholesteremia Assessment & Plan: Continue crestor.  Low cholesterol diet and exercise.  Follow lipid panel and liver function tests.   Lab Results  Component Value Date   CHOL 112 06/23/2022   HDL 43.10 06/23/2022   LDLCALC 53 06/23/2022   LDLDIRECT 157.1 01/21/2013   TRIG 80.0 06/23/2022   CHOLHDL 3 06/23/2022    Orders: -     Lipid panel; Future -     Hepatic function panel; Future  Primary hypertension Assessment & Plan: On micardis and amlodipine 5mg  bid.   Blood pressure as outlined.  Follow pressures.  Follow metabolic panel. Hold on making changes.    Orders: -     Basic metabolic panel; Future  Abnormal liver function Assessment & Plan: Continue diet and exercise.  Follow liver function tests.    Stress Assessment & Plan: Overall appears to be handling things relatively well. Follow.    Other orders -     amLODIPine Besylate; Take 1 tablet (5 mg total) by mouth 2 (two) times daily.  Dispense: 180 tablet; Refill: 1 -     busPIRone HCl; Take 1 tablet (5 mg total) by mouth daily.  Dispense: 90 tablet; Refill: 1 -     metFORMIN HCl ER; Take 2 tablets bid  Dispense: 360 tablet; Refill: 1 -     Telmisartan; Take 1 tablet (80 mg total) by mouth daily.  Dispense: 90 tablet; Refill: 1 -     Glucose Blood; Use as instructed to check blood sugars once daily  Dispense: 100 each; Refill: 12 -     hydroCHLOROthiazide; Take 1 capsule (12.5 mg total) by mouth daily.  Dispense: 30 capsule; Refill: 2     Einar Pheasant, MD

## 2022-07-02 ENCOUNTER — Encounter: Payer: Self-pay | Admitting: Internal Medicine

## 2022-07-02 NOTE — Assessment & Plan Note (Signed)
Doing well on metformin XR.  Bowels better.  Continues jardiance.  Low carb diet and exercise.  Follow met b and A1c.

## 2022-07-02 NOTE — Assessment & Plan Note (Signed)
Continue crestor.  Low cholesterol diet and exercise.  Follow lipid panel and liver function tests.   Lab Results  Component Value Date   CHOL 112 06/23/2022   HDL 43.10 06/23/2022   LDLCALC 53 06/23/2022   LDLDIRECT 157.1 01/21/2013   TRIG 80.0 06/23/2022   CHOLHDL 3 06/23/2022

## 2022-07-02 NOTE — Assessment & Plan Note (Signed)
Continue diet and exercise.  Follow liver function tests.   

## 2022-07-02 NOTE — Assessment & Plan Note (Addendum)
On micardis and amlodipine '5mg'$  bid.   Blood pressure as outlined.  Follow pressures.  Follow metabolic panel. Hold on making changes.

## 2022-07-02 NOTE — Assessment & Plan Note (Addendum)
Overall appears to be handling things relatively well.  Follow.   

## 2022-07-26 ENCOUNTER — Other Ambulatory Visit: Payer: Medicare Other

## 2022-07-27 ENCOUNTER — Other Ambulatory Visit (INDEPENDENT_AMBULATORY_CARE_PROVIDER_SITE_OTHER): Payer: Medicare Other

## 2022-07-27 DIAGNOSIS — E1165 Type 2 diabetes mellitus with hyperglycemia: Secondary | ICD-10-CM | POA: Diagnosis not present

## 2022-07-27 LAB — BASIC METABOLIC PANEL
BUN: 14 mg/dL (ref 6–23)
CO2: 30 mEq/L (ref 19–32)
Calcium: 9.5 mg/dL (ref 8.4–10.5)
Chloride: 102 mEq/L (ref 96–112)
Creatinine, Ser: 0.96 mg/dL (ref 0.40–1.20)
GFR: 59.26 mL/min — ABNORMAL LOW (ref >60.00)
Glucose, Bld: 127 mg/dL — ABNORMAL HIGH (ref 70–99)
Potassium: 3.8 mEq/L (ref 3.5–5.1)
Sodium: 144 mEq/L (ref 135–145)

## 2022-08-11 ENCOUNTER — Other Ambulatory Visit (INDEPENDENT_AMBULATORY_CARE_PROVIDER_SITE_OTHER): Payer: Medicare Other

## 2022-08-11 DIAGNOSIS — I10 Essential (primary) hypertension: Secondary | ICD-10-CM | POA: Diagnosis not present

## 2022-08-11 LAB — BASIC METABOLIC PANEL
BUN: 17 mg/dL (ref 6–23)
CO2: 29 mEq/L (ref 19–32)
Calcium: 9.2 mg/dL (ref 8.4–10.5)
Chloride: 100 mEq/L (ref 96–112)
Creatinine, Ser: 1.1 mg/dL (ref 0.40–1.20)
GFR: 50.32 mL/min — ABNORMAL LOW (ref >60.00)
Glucose, Bld: 154 mg/dL — ABNORMAL HIGH (ref 70–99)
Potassium: 3.9 mEq/L (ref 3.5–5.1)
Sodium: 138 mEq/L (ref 135–145)

## 2022-08-23 ENCOUNTER — Other Ambulatory Visit: Payer: Medicare Other

## 2022-08-25 ENCOUNTER — Encounter: Payer: Self-pay | Admitting: Internal Medicine

## 2022-08-25 ENCOUNTER — Ambulatory Visit (INDEPENDENT_AMBULATORY_CARE_PROVIDER_SITE_OTHER): Payer: Medicare Other | Admitting: Internal Medicine

## 2022-08-25 VITALS — BP 120/70 | HR 82 | Temp 98.1°F | Resp 16 | Ht 64.0 in | Wt 155.6 lb

## 2022-08-25 DIAGNOSIS — R944 Abnormal results of kidney function studies: Secondary | ICD-10-CM

## 2022-08-25 DIAGNOSIS — Z7984 Long term (current) use of oral hypoglycemic drugs: Secondary | ICD-10-CM | POA: Diagnosis not present

## 2022-08-25 DIAGNOSIS — E2839 Other primary ovarian failure: Secondary | ICD-10-CM

## 2022-08-25 DIAGNOSIS — E1165 Type 2 diabetes mellitus with hyperglycemia: Secondary | ICD-10-CM | POA: Diagnosis not present

## 2022-08-25 DIAGNOSIS — R945 Abnormal results of liver function studies: Secondary | ICD-10-CM

## 2022-08-25 DIAGNOSIS — I10 Essential (primary) hypertension: Secondary | ICD-10-CM

## 2022-08-25 DIAGNOSIS — F439 Reaction to severe stress, unspecified: Secondary | ICD-10-CM

## 2022-08-25 DIAGNOSIS — E7800 Pure hypercholesterolemia, unspecified: Secondary | ICD-10-CM

## 2022-08-25 LAB — BASIC METABOLIC PANEL
BUN: 18 mg/dL (ref 6–23)
CO2: 28 mEq/L (ref 19–32)
Calcium: 8.8 mg/dL (ref 8.4–10.5)
Chloride: 102 mEq/L (ref 96–112)
Creatinine, Ser: 1.3 mg/dL — ABNORMAL HIGH (ref 0.40–1.20)
GFR: 41.17 mL/min — ABNORMAL LOW (ref >60.00)
Glucose, Bld: 125 mg/dL — ABNORMAL HIGH (ref 70–99)
Potassium: 3.9 mEq/L (ref 3.5–5.1)
Sodium: 142 mEq/L (ref 135–145)

## 2022-08-25 MED ORDER — ROSUVASTATIN CALCIUM 20 MG PO TABS: 20.0000 mg | ORAL_TABLET | Freq: Every day | ORAL | 3 refills | Status: DC

## 2022-08-25 NOTE — Progress Notes (Unsigned)
Subjective:    Patient ID: Gabrielle Yu, female    DOB: 1950-08-18, 72 y.o.   MRN: [id number]  Patient here for  Chief Complaint  Patient presents with   Medical Management of Chronic Issues    HPI Here to follow up regarding her diabetes, hypertension and hypercholesterolemia. She is currently on telmisartan, hctz and amlodipine.  Blood pressures improved.  Most ranging 110-120s/60-70.  No chest pain or sob reported.  No abdominal pain or bowel change reported.  Increased stress.  Husband's health issues.  Discussed.  Overall appears to be handling things well.     Past Medical History:  Diagnosis Date   Diabetes mellitus (HCC)    High cholesterol    Hypercholesterolemia    Hypertension    Past Surgical History:  Procedure Laterality Date   BREAST BIOPSY Left 2014   core with clip   CATARACT EXTRACTION W/PHACO Right 01/24/2017   Procedure: CATARACT EXTRACTION PHACO AND INTRAOCULAR LENS PLACEMENT (IOC) RIGHT DIABETIC COMPLICATED;  Surgeon: Lockie Mola, MD;  Location: [facility];  Service: Ophthalmology;  Laterality: Right;  Diabetic - oral meds   TONSILLECTOMY AND ADENOIDECTOMY  1958   TUBAL LIGATION  1982   Family History  Problem Relation Age of Onset   Breast cancer Mother 40   Hypertension Mother    Heart disease Father        myocardial infarction - died age 66   Heart disease Other        paternal aunts and uncles   Colon cancer Neg Hx    Social History   Socioeconomic History   Marital status: Married    Spouse name: Not on file   Number of children: 2   Years of education: Not on file   Highest education level: Not on file  Occupational History    Employer: [employer]  Tobacco Use   Smoking status: Never   Smokeless tobacco: Never  Vaping Use   Vaping Use: Never used  Substance and Sexual Activity   Alcohol use: No    Alcohol/week: 0.0 standard drinks of alcohol   Drug use: No   Sexual activity: Not  on file  Other Topics Concern   Not on file  Social History Narrative   Not on file   Social Determinants of Health   Financial Resource Strain: Not on file  Food Insecurity: Not on file  Transportation Needs: Not on file  Physical Activity: Not on file  Stress: Not on file  Social Connections: Not on file     Review of Systems  Constitutional:  Negative for appetite change and unexpected weight change.  HENT:  Negative for congestion and sinus pressure.   Respiratory:  Negative for cough, chest tightness and shortness of breath.   Cardiovascular:  Negative for chest pain, palpitations and leg swelling.  Gastrointestinal:  Negative for abdominal pain, diarrhea, nausea and vomiting.  Genitourinary:  Negative for difficulty urinating and dysuria.  Musculoskeletal:  Negative for joint swelling and myalgias.  Skin:  Negative for color change and rash.  Neurological:  Negative for dizziness and headaches.  Psychiatric/Behavioral:  Negative for agitation and dysphoric mood.        Objective:     BP 120/70   Pulse 82   Temp 98.1 F (36.7 C)   Resp 16   Ht 5\' 4"  (1.626 m)   Wt 155 lb 9.6 oz (70.6 kg)   SpO2 98%   BMI 26.71 kg/m  Wt  Readings from Last 3 Encounters:  08/25/22 155 lb 9.6 oz (70.6 kg)  06/27/22 155 lb (70.3 kg)  04/28/22 156 lb 9.6 oz (71 kg)    Physical Exam Vitals reviewed.  Constitutional:      General: She is not in acute distress.    Appearance: Normal appearance.  HENT:     Head: Normocephalic and atraumatic.     Right Ear: External ear normal.     Left Ear: External ear normal.  Eyes:     General: No scleral icterus.       Right eye: No discharge.        Left eye: No discharge.     Conjunctiva/sclera: Conjunctivae normal.  Neck:     Thyroid: No thyromegaly.  Cardiovascular:     Rate and Rhythm: Normal rate and regular rhythm.  Pulmonary:     Effort: No respiratory distress.     Breath sounds: Normal breath sounds. No wheezing.   Abdominal:     General: Bowel sounds are normal.     Palpations: Abdomen is soft.     Tenderness: There is no abdominal tenderness.  Musculoskeletal:        General: No swelling or tenderness.     Cervical back: Neck supple. No tenderness.  Lymphadenopathy:     Cervical: No cervical adenopathy.  Skin:    Findings: No erythema or rash.  Neurological:     Mental Status: She is alert.  Psychiatric:        Mood and Affect: Mood normal.        Behavior: Behavior normal.      Outpatient Encounter Medications as of 08/25/2022  Medication Sig   amLODipine (NORVASC) 5 MG tablet Take 1 tablet (5 mg total) by mouth 2 (two) times daily.   busPIRone (BUSPAR) 5 MG tablet Take 1 tablet (5 mg total) by mouth daily.   glucose blood test strip Use as instructed to check blood sugars once daily   hydrochlorothiazide (MICROZIDE) 12.5 MG capsule Take 1 capsule (12.5 mg total) by mouth daily.   JARDIANCE 25 MG TABS tablet TAKE 1 TABLET BY MOUTH EVERY DAY   metFORMIN (GLUCOPHAGE-XR) 500 MG 24 hr tablet Take 2 tablets bid   metroNIDAZOLE (METROGEL) 1 % gel APPLY TO AFFECTED AREA TOPICALLY EVERY DAY   rosuvastatin (CRESTOR) 20 MG tablet Take 1 tablet (20 mg total) by mouth daily.   telmisartan (MICARDIS) 80 MG tablet Take 1 tablet (80 mg total) by mouth daily.   No facility-administered encounter medications on file as of 08/25/2022.     Lab Results  Component Value Date   WBC 5.7 11/17/2021   HGB 12.7 11/17/2021   HCT 39.2 11/17/2021   PLT 209.0 11/17/2021   GLUCOSE 154 (H) 08/11/2022   CHOL 112 06/23/2022   TRIG 80.0 06/23/2022   HDL 43.10 06/23/2022   LDLDIRECT 157.1 01/21/2013   LDLCALC 53 06/23/2022   ALT 15 06/23/2022   AST 16 06/23/2022   NA 138 08/11/2022   K 3.9 08/11/2022   CL 100 08/11/2022   CREATININE 1.10 08/11/2022   BUN 17 08/11/2022   CO2 29 08/11/2022   TSH 4.49 02/28/2022   HGBA1C 7.0 (H) 06/23/2022   MICROALBUR 1.2 11/17/2021    MM 3D SCREEN BREAST  BILATERAL  Result Date: 03/15/2022 CLINICAL DATA:  Screening. EXAM: DIGITAL SCREENING BILATERAL MAMMOGRAM WITH TOMOSYNTHESIS AND CAD TECHNIQUE: Bilateral screening digital craniocaudal and mediolateral oblique mammograms were obtained. Bilateral screening digital breast tomosynthesis was performed. The images  were evaluated with computer-aided detection. COMPARISON:  Previous exam(s). ACR Breast Density Category b: There are scattered areas of fibroglandular density. FINDINGS: There are no findings suspicious for malignancy. IMPRESSION: No mammographic evidence of malignancy. A result letter of this screening mammogram will be mailed directly to the patient. RECOMMENDATION: Screening mammogram in one year. (Code:SM-B-01Y) BI-RADS CATEGORY  1: Negative. Electronically Signed   By: Romona Curls M.D.   On: 03/15/2022 14:43   DG Bone Density  Result Date: 03/13/2022 EXAM: DUAL X-RAY ABSORPTIOMETRY (DXA) FOR BONE MINERAL DENSITY IMPRESSION: Your patient Darilyn Nolte completed a BMD test on 03/13/2022 using the [employer] iDXA DXA System (software version: 14.10) manufactured by [employer]. The following summarizes the results of our evaluation. Technologist::TNB PATIENT BIOGRAPHICAL: Name: Shenitha, Degolyer Patient ID: [id number] Birth Date: 02-17-1951 Height: 64.0 in. Gender: Female Exam Date: 03/13/2022 Weight: 159.4 lbs. Indications: Caucasian, Diabetic, Low Calcium Intake, Postmenopausal Fractures: Treatments: DENSITOMETRY RESULTS: Site      Region     Measured Date Measured Age WHO Classification Young Adult T-score BMD         %Change vs. Previous Significant Change (*) AP Spine L1-L4 03/13/2022 71.7 Normal 1.0 1.316 g/cm2 - - DualFemur Neck Right 03/13/2022 71.7 Normal -0.5 0.963 g/cm2 - - ASSESSMENT: The BMD measured at Femur Neck Right is 0.963 g/cm2 with a T-score of -0.5. This patient is considered normal according to World Health Organization [facility]) criteria. Patient is not a candidate for FRAX  due to normal bone density exam. The scan quality is good. World Science writer [facility]) criteria for post-menopausal, Caucasian Women: Normal:                   T-score at or above -1 SD Osteopenia/low bone mass: T-score between -1 and -2.5 SD Osteoporosis:             T-score at or below -2.5 SD RECOMMENDATIONS: 1. All patients should optimize calcium and vitamin D intake. 2. Consider FDA-approved medical therapies in postmenopausal women and men aged 37 years and older, based on the following: a. A hip or vertebral(clinical or morphometric) fracture b. T-score < -2.5 at the femoral neck or spine after appropriate evaluation to exclude secondary causes c. Low bone mass (T-score between -1.0 and -2.5 at the femoral neck or spine) and a 10-year probability of a hip fracture > 3% or a 10-year probability of a major osteoporosis-related fracture > 20% based on the [place]-adapted WHO algorithm 3. Clinician judgment and/or patient preferences may indicate treatment for people with 10-year fracture probabilities above or below these levels FOLLOW-UP: People with diagnosed cases of osteoporosis or at high risk for fracture should have regular bone mineral density tests. For patients eligible for Medicare, routine testing is allowed once every 2 years. The testing frequency can be increased to one year for patients who have rapidly progressing disease, those who are receiving or discontinuing medical therapy to restore bone mass, or have additional risk factors. I have reviewed this report, and agree with the above findings. [facility] Electronically Signed   By: Frederico Hamman M.D.   On: 03/13/2022 10:44       Assessment & Plan:  Decreased GFR -     Basic metabolic panel; Future     Dale Mio, MD

## 2022-08-27 ENCOUNTER — Encounter: Payer: Self-pay | Admitting: Internal Medicine

## 2022-08-27 DIAGNOSIS — R944 Abnormal results of kidney function studies: Secondary | ICD-10-CM | POA: Insufficient documentation

## 2022-08-27 NOTE — Assessment & Plan Note (Signed)
Doing well on metformin XR.  Bowels better.  Continues jardiance.  Low carb diet and exercise.  Follow met b and A1c.  

## 2022-08-27 NOTE — Assessment & Plan Note (Signed)
Noticed decrease - recent lab.  Continue to avoid antiinflammatory medication.  Stay hydrated.  Recheck met b today.

## 2022-08-27 NOTE — Assessment & Plan Note (Signed)
On micardis and amlodipine 5mg  bid.   Blood pressure appears to be doing well as outlined. Follow pressures.  Follow metabolic panel. Hold on making changes.

## 2022-08-27 NOTE — Assessment & Plan Note (Signed)
Continue diet and exercise.  Follow liver function tests.   

## 2022-08-27 NOTE — Assessment & Plan Note (Signed)
Overall appears to be handling things relatively well as outlined. Follow.

## 2022-08-27 NOTE — Assessment & Plan Note (Signed)
Continue crestor.  Low cholesterol diet and exercise.  Follow lipid panel and liver function tests.  Cholesterol doing well.  Lab Results  Component Value Date   CHOL 112 06/23/2022   HDL 43.10 06/23/2022   LDLCALC 53 06/23/2022   LDLDIRECT 157.1 01/21/2013   TRIG 80.0 06/23/2022   CHOLHDL 3 06/23/2022

## 2022-08-27 NOTE — Assessment & Plan Note (Signed)
Recent bone density - normal.  

## 2022-08-28 ENCOUNTER — Other Ambulatory Visit: Payer: Self-pay

## 2022-08-28 ENCOUNTER — Telehealth: Payer: Self-pay | Admitting: Internal Medicine

## 2022-08-28 DIAGNOSIS — R944 Abnormal results of kidney function studies: Secondary | ICD-10-CM

## 2022-08-28 NOTE — Telephone Encounter (Signed)
See results note. 

## 2022-08-28 NOTE — Telephone Encounter (Signed)
Pt it returning Dr.Scott call from yesterday.

## 2022-09-01 ENCOUNTER — Other Ambulatory Visit (INDEPENDENT_AMBULATORY_CARE_PROVIDER_SITE_OTHER): Payer: Medicare Other

## 2022-09-01 DIAGNOSIS — R944 Abnormal results of kidney function studies: Secondary | ICD-10-CM

## 2022-09-01 LAB — BASIC METABOLIC PANEL
BUN: 10 mg/dL (ref 6–23)
CO2: 27 mEq/L (ref 19–32)
Calcium: 8.9 mg/dL (ref 8.4–10.5)
Chloride: 107 mEq/L (ref 96–112)
Creatinine, Ser: 0.9 mg/dL (ref 0.40–1.20)
GFR: 63.99 mL/min (ref >60.00)
Glucose, Bld: 102 mg/dL — ABNORMAL HIGH (ref 70–99)
Potassium: 3.6 mEq/L (ref 3.5–5.1)
Sodium: 142 mEq/L (ref 135–145)

## 2022-09-01 LAB — URINALYSIS, ROUTINE W REFLEX MICROSCOPIC
Bilirubin Urine: NEGATIVE
Hgb urine dipstick: NEGATIVE
Ketones, ur: NEGATIVE
Leukocytes,Ua: NEGATIVE
Nitrite: NEGATIVE
Specific Gravity, Urine: <=1.005 — AB (ref 1.000–1.030)
Total Protein, Urine: NEGATIVE
Urine Glucose: >=1000 — AB
Urobilinogen, UA: 0.2 (ref 0.0–1.0)
pH: 5.5 (ref 5.0–8.0)

## 2022-09-22 ENCOUNTER — Other Ambulatory Visit: Payer: Self-pay | Admitting: Internal Medicine

## 2022-09-26 ENCOUNTER — Ambulatory Visit (INDEPENDENT_AMBULATORY_CARE_PROVIDER_SITE_OTHER): Payer: Medicare Other

## 2022-09-26 VITALS — Ht 64.0 in | Wt 155.0 lb

## 2022-09-26 DIAGNOSIS — Z0000 Encounter for general adult medical examination without abnormal findings: Secondary | ICD-10-CM

## 2022-09-26 NOTE — Patient Instructions (Signed)
Ms. Gabrielle Yu , Thank you for taking time to come for your Medicare Wellness Visit. I appreciate your ongoing commitment to your health goals. Please review the following plan we discussed and let me know if I can assist you in the future.   These are the goals we discussed:  Goals      DIET - EAT MORE FRUITS AND VEGETABLES        This is a list of the screening recommended for you and due dates:  Health Maintenance  Topic Date Due   Eye exam for diabetics  12/11/2017   COVID-19 Vaccine (6 - 2023-24 season) 04/06/2022   Colon Cancer Screening  11/18/2022*   Yearly kidney health urinalysis for diabetes  11/18/2022   Flu Shot  11/23/2022   Hemoglobin A1C  12/24/2022   Complete foot exam   01/11/2023   Mammogram  03/14/2023   Yearly kidney function blood test for diabetes  09/01/2023   Medicare Annual Wellness Visit  09/26/2023   Pneumonia Vaccine  Completed   DEXA scan (bone density measurement)  Completed   Hepatitis C Screening  Completed   Zoster (Shingles) Vaccine  Completed   HPV Vaccine  Aged Out   DTaP/Tdap/Td vaccine  Discontinued  *Topic was postponed. The date shown is not the original due date.    Advanced directives: no  Conditions/risks identified: none  Next appointment: Follow up in one year for your annual wellness visit 09/28/22 @ 10:15 am by phone   Preventive Care 65 Years and Older, Female Preventive care refers to lifestyle choices and visits with your health care provider that can promote health and wellness. What does preventive care include? A yearly physical exam. This is also called an annual well check. Dental exams once or twice a year. Routine eye exams. Ask your health care provider how often you should have your eyes checked. Personal lifestyle choices, including: Daily care of your teeth and gums. Regular physical activity. Eating a healthy diet. Avoiding tobacco and drug use. Limiting alcohol use. Practicing safe sex. Taking low-dose  aspirin every day. Taking vitamin and mineral supplements as recommended by your health care provider. What happens during an annual well check? The services and screenings done by your health care provider during your annual well check will depend on your age, overall health, lifestyle risk factors, and family history of disease. Counseling  Your health care provider may ask you questions about your: Alcohol use. Tobacco use. Drug use. Emotional well-being. Home and relationship well-being. Sexual activity. Eating habits. History of falls. Memory and ability to understand (cognition). Work and work Astronomer. Reproductive health. Screening  You may have the following tests or measurements: Height, weight, and BMI. Blood pressure. Lipid and cholesterol levels. These may be checked every 5 years, or more frequently if you are over 6 years old. Skin check. Lung cancer screening. You may have this screening every year starting at age 32 if you have a 30-pack-year history of smoking and currently smoke or have quit within the past 15 years. Fecal occult blood test (FOBT) of the stool. You may have this test every year starting at age 55. Flexible sigmoidoscopy or colonoscopy. You may have a sigmoidoscopy every 5 years or a colonoscopy every 10 years starting at age 71. Hepatitis C blood test. Hepatitis B blood test. Sexually transmitted disease (STD) testing. Diabetes screening. This is done by checking your blood sugar (glucose) after you have not eaten for a while (fasting). You may have this done  every 1-3 years. Bone density scan. This is done to screen for osteoporosis. You may have this done starting at age 1. Mammogram. This may be done every 1-2 years. Talk to your health care provider about how often you should have regular mammograms. Talk with your health care provider about your test results, treatment options, and if necessary, the need for more tests. Vaccines  Your  health care provider may recommend certain vaccines, such as: Influenza vaccine. This is recommended every year. Tetanus, diphtheria, and acellular pertussis (Tdap, Td) vaccine. You may need a Td booster every 10 years. Zoster vaccine. You may need this after age 51. Pneumococcal 13-valent conjugate (PCV13) vaccine. One dose is recommended after age 32. Pneumococcal polysaccharide (PPSV23) vaccine. One dose is recommended after age 33. Talk to your health care provider about which screenings and vaccines you need and how often you need them. This information is not intended to replace advice given to you by your health care provider. Make sure you discuss any questions you have with your health care provider. Document Released: 05/07/2015 Document Revised: 12/29/2015 Document Reviewed: 02/09/2015 Elsevier Interactive Patient Education  2017 [employer].  Fall Prevention in the Home Falls can cause injuries. They can happen to people of all ages. There are many things you can do to make your home safe and to help prevent falls. What can I do on the outside of my home? Regularly fix the edges of walkways and driveways and fix any cracks. Remove anything that might make you trip as you walk through a door, such as a raised step or threshold. Trim any bushes or trees on the path to your home. Use bright outdoor lighting. Clear any walking paths of anything that might make someone trip, such as rocks or tools. Regularly check to see if handrails are loose or broken. Make sure that both sides of any steps have handrails. Any raised decks and porches should have guardrails on the edges. Have any leaves, snow, or ice cleared regularly. Use sand or salt on walking paths during winter. Clean up any spills in your garage right away. This includes oil or grease spills. What can I do in the bathroom? Use night lights. Install grab bars by the toilet and in the tub and shower. Do not use towel bars as  grab bars. Use non-skid mats or decals in the tub or shower. If you need to sit down in the shower, use a plastic, non-slip stool. Keep the floor dry. Clean up any water that spills on the floor as soon as it happens. Remove soap buildup in the tub or shower regularly. Attach bath mats securely with double-sided non-slip rug tape. Do not have throw rugs and other things on the floor that can make you trip. What can I do in the bedroom? Use night lights. Make sure that you have a light by your bed that is easy to reach. Do not use any sheets or blankets that are too big for your bed. They should not hang down onto the floor. Have a firm chair that has side arms. You can use this for support while you get dressed. Do not have throw rugs and other things on the floor that can make you trip. What can I do in the kitchen? Clean up any spills right away. Avoid walking on wet floors. Keep items that you use a lot in easy-to-reach places. If you need to reach something above you, use a strong step stool that has  a grab bar. Keep electrical cords out of the way. Do not use floor polish or wax that makes floors slippery. If you must use wax, use non-skid floor wax. Do not have throw rugs and other things on the floor that can make you trip. What can I do with my stairs? Do not leave any items on the stairs. Make sure that there are handrails on both sides of the stairs and use them. Fix handrails that are broken or loose. Make sure that handrails are as long as the stairways. Check any carpeting to make sure that it is firmly attached to the stairs. Fix any carpet that is loose or worn. Avoid having throw rugs at the top or bottom of the stairs. If you do have throw rugs, attach them to the floor with carpet tape. Make sure that you have a light switch at the top of the stairs and the bottom of the stairs. If you do not have them, ask someone to add them for you. What else can I do to help prevent  falls? Wear shoes that: Do not have high heels. Have rubber bottoms. Are comfortable and fit you well. Are closed at the toe. Do not wear sandals. If you use a stepladder: Make sure that it is fully opened. Do not climb a closed stepladder. Make sure that both sides of the stepladder are locked into place. Ask someone to hold it for you, if possible. Clearly mark and make sure that you can see: Any grab bars or handrails. First and last steps. Where the edge of each step is. Use tools that help you move around (mobility aids) if they are needed. These include: Canes. Walkers. Scooters. Crutches. Turn on the lights when you go into a dark area. Replace any light bulbs as soon as they burn out. Set up your furniture so you have a clear path. Avoid moving your furniture around. If any of your floors are uneven, fix them. If there are any pets around you, be aware of where they are. Review your medicines with your doctor. Some medicines can make you feel dizzy. This can increase your chance of falling. Ask your doctor what other things that you can do to help prevent falls. This information is not intended to replace advice given to you by your health care provider. Make sure you discuss any questions you have with your health care provider. Document Released: 02/04/2009 Document Revised: 09/16/2015 Document Reviewed: 05/15/2014 Elsevier Interactive Patient Education  2017 [employer].

## 2022-09-26 NOTE — Progress Notes (Signed)
I connected with  Lanny Hurst on 09/26/22 by a audio enabled telemedicine application and verified that I am speaking with the correct person using two identifiers.  Patient Location: Home  Provider Location: Office/Clinic  I discussed the limitations of evaluation and management by telemedicine. The patient expressed understanding and agreed to proceed.  Subjective:   Gabrielle Yu is a 72 y.o. female who presents for Medicare Annual (Subsequent) preventive examination.  Review of Systems     Cardiac Risk Factors include: advanced age (>65men, >53 women);diabetes mellitus;hypertension     Objective:    Today's Vitals   09/26/22 1348  Weight: 155 lb (70.3 kg)  Height: 5\' 4"  (1.626 m)   Body mass index is 26.61 kg/m.     09/26/2022    1:36 PM 01/24/2017    7:10 AM  Advanced Directives  Does Patient Have a Medical Advance Directive? No Yes  Type of Advance Directive  Living will  Does patient want to make changes to medical advance directive?  No - Patient declined  Would patient like information on creating a medical advance directive? No - Patient declined     Current Medications (verified) Outpatient Encounter Medications as of 09/26/2022  Medication Sig   amLODipine (NORVASC) 5 MG tablet Take 1 tablet (5 mg total) by mouth 2 (two) times daily.   busPIRone (BUSPAR) 5 MG tablet Take 1 tablet (5 mg total) by mouth daily.   glucose blood test strip Use as instructed to check blood sugars once daily   JARDIANCE 25 MG TABS tablet TAKE 1 TABLET BY MOUTH EVERY DAY   metFORMIN (GLUCOPHAGE-XR) 500 MG 24 hr tablet Take 2 tablets bid   metroNIDAZOLE (METROGEL) 1 % gel APPLY TO AFFECTED AREA TOPICALLY EVERY DAY   rosuvastatin (CRESTOR) 20 MG tablet Take 1 tablet (20 mg total) by mouth daily.   telmisartan (MICARDIS) 80 MG tablet Take 1 tablet (80 mg total) by mouth daily.   hydrochlorothiazide (MICROZIDE) 12.5 MG capsule TAKE 1 CAPSULE BY MOUTH EVERY DAY (Patient not  taking: Reported on 09/26/2022)   No facility-administered encounter medications on file as of 09/26/2022.    Allergies (verified) Zoloft [sertraline hcl]   History: Past Medical History:  Diagnosis Date   Diabetes mellitus (HCC)    High cholesterol    Hypercholesterolemia    Hypertension    Past Surgical History:  Procedure Laterality Date   BREAST BIOPSY Left 2014   core with clip   CATARACT EXTRACTION W/PHACO Right 01/24/2017   Procedure: CATARACT EXTRACTION PHACO AND INTRAOCULAR LENS PLACEMENT (IOC) RIGHT DIABETIC COMPLICATED;  Surgeon: Lockie Mola, MD;  Location: [facility];  Service: Ophthalmology;  Laterality: Right;  Diabetic - oral meds   TONSILLECTOMY AND ADENOIDECTOMY  1958   TUBAL LIGATION  1982   Family History  Problem Relation Age of Onset   Breast cancer Mother 45   Hypertension Mother    Heart disease Father        myocardial infarction - died age 55   Heart disease Other        paternal aunts and uncles   Colon cancer Neg Hx    Social History   Socioeconomic History   Marital status: Married    Spouse name: Not on file   Number of children: 2   Years of education: Not on file   Highest education level: Not on file  Occupational History    Employer: [employer]  Tobacco Use   Smoking status:  Never   Smokeless tobacco: Never  Vaping Use   Vaping Use: Never used  Substance and Sexual Activity   Alcohol use: No    Alcohol/week: 0.0 standard drinks of alcohol   Drug use: No   Sexual activity: Not on file  Other Topics Concern   Not on file  Social History Narrative   Not on file   Social Determinants of Health   Financial Resource Strain: Low Risk  (09/26/2022)   Overall Financial Resource Strain (CARDIA)    Difficulty of Paying Living Expenses: Not hard at all  Food Insecurity: No Food Insecurity (09/26/2022)   Hunger Vital Sign    Worried About Running Out of Food in the Last Year: Never true    Ran Out  of Food in the Last Year: Never true  Transportation Needs: No Transportation Needs (09/26/2022)   PRAPARE - Administrator, Civil Service (Medical): No    Lack of Transportation (Non-Medical): No  Physical Activity: Insufficiently Active (09/26/2022)   Exercise Vital Sign    Days of Exercise per Week: 2 days    Minutes of Exercise per Session: 20 min  Stress: No Stress Concern Present (09/26/2022)   [employer] of Occupational Health - Occupational Stress Questionnaire    Feeling of Stress : Not at all  Social Connections: Moderately Integrated (09/26/2022)   Social Connection and Isolation Panel [NHANES]    Frequency of Communication with Friends and Family: Three times a week    Frequency of Social Gatherings with Friends and Family: More than three times a week    Attends Religious Services: Never    Database administrator or Organizations: Yes    Attends Banker Meetings: Never    Marital Status: Married    Tobacco Counseling Counseling given: Not Answered   Clinical Intake:  Pre-visit preparation completed: Yes  Pain : No/denies pain     Nutritional Risks: None Diabetes: Yes CBG done?: No Did pt. bring in CBG monitor from home?: No  How often do you need to have someone help you when you read instructions, pamphlets, or other written materials from your doctor or pharmacy?: 1 - Never  Diabetic?yes Nutrition Risk Assessment:  Has the patient had any N/V/D within the last 2 months?  No  Does the patient have any non-healing wounds?  No  Has the patient had any unintentional weight loss or weight gain?  No   Diabetes:  Is the patient diabetic?  Yes  If diabetic, was a CBG obtained today?  No  Did the patient bring in their glucometer from home?  No  How often do you monitor your CBG's? Once/day.   Financial Strains and Diabetes Management:  Are you having any financial strains with the device, your supplies or your medication? No .   Does the patient want to be seen by Chronic Care Management for management of their diabetes?  No  Would the patient like to be referred to a Nutritionist or for Diabetic Management?  No   Diabetic Exams:  Diabetic Eye Exam: Completed 12/11/16. Pt has been advised about the importance in completing this exam.   Diabetic Foot Exam: Completed 01/10/22. Pt has been advised about the importance in completing this exam   Interpreter Needed?: No  Information entered by :: Kennedy Bucker, LPN   Activities of Daily Living    09/26/2022    1:37 PM  In your present state of health, do you have any difficulty performing  the following activities:  Hearing? 0  Vision? 0  Difficulty concentrating or making decisions? 0  Walking or climbing stairs? 0  Dressing or bathing? 0  Doing errands, shopping? 0  Preparing Food and eating ? N  Using the Toilet? N  In the past six months, have you accidently leaked urine? N  Do you have problems with loss of bowel control? N  Managing your Medications? N  Managing your Finances? N  Housekeeping or managing your Housekeeping? N    Patient Care Team: Dale Nyssa, MD as PCP - General (Internal Medicine) Lemar Livings Merrily Pew, MD as Consulting Physician (General Surgery) Dale Sheffield, MD as Referring Physician (Internal Medicine)  Indicate any recent Medical Services you may have received from other than [facility] providers in the past year (date may be approximate).     Assessment:   This is a routine wellness examination for Gabrielle Yu.  Hearing/Vision screen Hearing Screening - Comments:: No aids  Vision Screening - Comments:: Readers-   Dietary issues and exercise activities discussed: Current Exercise Habits: Home exercise routine, Type of exercise: walking, Time (Minutes): 20, Frequency (Times/Week): 2, Weekly Exercise (Minutes/Week): 40, Intensity: Mild   Goals Addressed             This Visit's Progress    DIET - EAT MORE FRUITS AND  VEGETABLES         Depression Screen    09/26/2022    1:35 PM 04/28/2022   11:16 AM 03/07/2022    1:28 PM 01/10/2022    1:22 PM 11/17/2021    7:22 AM 02/11/2021   10:29 AM 10/12/2020   12:04 PM  PHQ 2/9 Scores  PHQ - 2 Score 0 0 0 0 0 0 0  PHQ- 9 Score 0 0         Fall Risk    09/26/2022    1:37 PM 04/28/2022   11:16 AM 03/07/2022    1:27 PM 01/10/2022    1:22 PM 11/17/2021    7:22 AM  Fall Risk   Falls in the past year? 0 0 0 0 0  Number falls in past yr: 0 0 0  0  Injury with Fall? 0 0 0  0  Risk for fall due to : No Fall Risks No Fall Risks No Fall Risks No Fall Risks No Fall Risks  Follow up Falls prevention discussed;Falls evaluation completed Falls evaluation completed Falls evaluation completed Falls evaluation completed Falls evaluation completed    FALL RISK PREVENTION PERTAINING TO THE HOME:  Any stairs in or around the home? Yes  If so, are there any without handrails? No  Home free of loose throw rugs in walkways, pet beds, electrical cords, etc? Yes  Adequate lighting in your home to reduce risk of falls? Yes   ASSISTIVE DEVICES UTILIZED TO PREVENT FALLS:  Life alert? No  Use of a cane, walker or w/c? No  Grab bars in the bathroom? Yes  Shower chair or bench in shower? No  Elevated toilet seat or a handicapped toilet? No    Cognitive Function:        09/26/2022    1:42 PM  6CIT Screen  What Year? 0 points  What month? 0 points  What time? 0 points  Count back from 20 0 points  Months in reverse 0 points    Immunizations Immunization History  Administered Date(s) Administered   Fluad Quad(high Dose 65+) 01/27/2019, 01/21/2020, 01/10/2022   Influenza, High Dose Seasonal PF 02/09/2017, 02/15/2018, 01/31/2021  Influenza,inj,Quad PF,6+ Mos 01/27/2013, 01/29/2014   PFIZER(Purple Top)SARS-COV-2 Vaccination 05/29/2019, 06/23/2019, 02/04/2020, 01/31/2021   Pneumococcal Conjugate-13 02/19/2019   Pneumococcal Polysaccharide-23 06/10/2020   Unspecified  SARS-COV-2 Vaccination 02/09/2022   Zoster Recombinat (Shingrix) 12/19/2021, 03/23/2022    TDAP status: Due, Education has been provided regarding the importance of this vaccine. Advised may receive this vaccine at local pharmacy or Health Dept. Aware to provide a copy of the vaccination record if obtained from local pharmacy or Health Dept. Verbalized acceptance and understanding.  Flu Vaccine status: Up to date  Pneumococcal vaccine status: Up to date  Covid-19 vaccine status: Completed vaccines  Qualifies for Shingles Vaccine? Yes   Zostavax completed No   Shingrix Completed?: Yes  Screening Tests Health Maintenance  Topic Date Due   OPHTHALMOLOGY EXAM  12/11/2017   COVID-19 Vaccine (6 - 2023-24 season) 04/06/2022   Colonoscopy  11/18/2022 (Originally 06/08/1995)   Diabetic kidney evaluation - Urine ACR  11/18/2022   INFLUENZA VACCINE  11/23/2022   HEMOGLOBIN A1C  12/24/2022   FOOT EXAM  01/11/2023   MAMMOGRAM  03/14/2023   Diabetic kidney evaluation - eGFR measurement  09/01/2023   Medicare Annual Wellness (AWV)  09/26/2023   Pneumonia Vaccine 48+ Years old  Completed   DEXA SCAN  Completed   Hepatitis C Screening  Completed   Zoster Vaccines- Shingrix  Completed   HPV VACCINES  Aged Out   DTaP/Tdap/Td  Discontinued    Health Maintenance  Health Maintenance Due  Topic Date Due   OPHTHALMOLOGY EXAM  12/11/2017   COVID-19 Vaccine (6 - 2023-24 season) 04/06/2022    Colorectal cancer screening: Type of screening: Colonoscopy. Completed POSTPONED 11/18/22. Repeat every 0 years  Mammogram status: Completed 03/13/22. Repeat every year  Bone Density status: Completed 03/13/22. Results reflect: Bone density results: NORMAL. Repeat every 5 years.  Lung Cancer Screening: (Low Dose CT Chest recommended if Age 30-80 years, 30 pack-year currently smoking OR have quit w/in 15years.) does not qualify.    Additional Screening:  Hepatitis C Screening: does qualify; Completed  11/17/21  Vision Screening: Recommended annual ophthalmology exams for early detection of glaucoma and other disorders of the eye. Is the patient up to date with their annual eye exam?  No  Who is the provider or what is the name of the office in which the patient attends annual eye exams? NO ONE If pt is not established with a provider, would they like to be referred to a provider to establish care? No .   Dental Screening: Recommended annual dental exams for proper oral hygiene  Community Resource Referral / Chronic Care Management: CRR required this visit?  No   CCM required this visit?  No      Plan:     I have personally reviewed and noted the following in the patient's chart:   Medical and social history Use of alcohol, tobacco or illicit drugs  Current medications and supplements including opioid prescriptions. Patient is not currently taking opioid prescriptions. Functional ability and status Nutritional status Physical activity Advanced directives List of other physicians Hospitalizations, surgeries, and ER visits in previous 12 months Vitals Screenings to include cognitive, depression, and falls Referrals and appointments  In addition, I have reviewed and discussed with patient certain preventive protocols, quality metrics, and best practice recommendations. A written personalized care plan for preventive services as well as general preventive health recommendations were provided to patient.     Hal Hope, LPN   04/29/1094   Nurse Notes:  NONE

## 2022-11-06 ENCOUNTER — Other Ambulatory Visit (INDEPENDENT_AMBULATORY_CARE_PROVIDER_SITE_OTHER): Payer: Medicare Other

## 2022-11-06 DIAGNOSIS — E1165 Type 2 diabetes mellitus with hyperglycemia: Secondary | ICD-10-CM

## 2022-11-06 DIAGNOSIS — E7800 Pure hypercholesterolemia, unspecified: Secondary | ICD-10-CM | POA: Diagnosis not present

## 2022-11-06 LAB — HEPATIC FUNCTION PANEL
ALT: 23 U/L (ref 0–35)
AST: 21 U/L (ref 0–37)
Albumin: 4.4 g/dL (ref 3.5–5.2)
Alkaline Phosphatase: 53 U/L (ref 39–117)
Bilirubin, Direct: 0.1 mg/dL (ref 0.0–0.3)
Total Bilirubin: 0.5 mg/dL (ref 0.2–1.2)
Total Protein: 7.1 g/dL (ref 6.0–8.3)

## 2022-11-06 LAB — LIPID PANEL
Cholesterol: 118 mg/dL (ref 0–200)
HDL: 45.1 mg/dL (ref >39.00)
LDL Cholesterol: 57 mg/dL (ref 0–99)
NonHDL: 73.16
Total CHOL/HDL Ratio: 3
Triglycerides: 82 mg/dL (ref 0.0–149.0)
VLDL: 16.4 mg/dL (ref 0.0–40.0)

## 2022-11-06 LAB — HEMOGLOBIN A1C: Hgb A1c MFr Bld: 6.9 % — ABNORMAL HIGH (ref 4.6–6.5)

## 2022-11-06 LAB — MICROALBUMIN / CREATININE URINE RATIO
Creatinine,U: 70.6 mg/dL
Microalb Creat Ratio: 1 mg/g (ref 0.0–30.0)
Microalb, Ur: 0.7 mg/dL (ref 0.0–1.9)

## 2022-11-07 ENCOUNTER — Other Ambulatory Visit (INDEPENDENT_AMBULATORY_CARE_PROVIDER_SITE_OTHER): Payer: Medicare Other

## 2022-11-07 DIAGNOSIS — I10 Essential (primary) hypertension: Secondary | ICD-10-CM

## 2022-11-07 LAB — BASIC METABOLIC PANEL
BUN: 10 mg/dL (ref 6–23)
CO2: 25 mEq/L (ref 19–32)
Calcium: 9.2 mg/dL (ref 8.4–10.5)
Chloride: 106 mEq/L (ref 96–112)
Creatinine, Ser: 0.9 mg/dL (ref 0.40–1.20)
GFR: 63.91 mL/min (ref >60.00)
Glucose, Bld: 112 mg/dL — ABNORMAL HIGH (ref 70–99)
Potassium: 3.6 mEq/L (ref 3.5–5.1)
Sodium: 140 mEq/L (ref 135–145)

## 2022-11-08 ENCOUNTER — Ambulatory Visit (INDEPENDENT_AMBULATORY_CARE_PROVIDER_SITE_OTHER): Payer: Medicare Other | Admitting: Internal Medicine

## 2022-11-08 VITALS — BP 126/64 | HR 86 | Temp 97.9°F | Resp 16 | Ht 64.0 in | Wt 156.0 lb

## 2022-11-08 DIAGNOSIS — E7800 Pure hypercholesterolemia, unspecified: Secondary | ICD-10-CM | POA: Diagnosis not present

## 2022-11-08 DIAGNOSIS — F439 Reaction to severe stress, unspecified: Secondary | ICD-10-CM

## 2022-11-08 DIAGNOSIS — E1165 Type 2 diabetes mellitus with hyperglycemia: Secondary | ICD-10-CM | POA: Diagnosis not present

## 2022-11-08 DIAGNOSIS — I10 Essential (primary) hypertension: Secondary | ICD-10-CM

## 2022-11-08 DIAGNOSIS — Z7984 Long term (current) use of oral hypoglycemic drugs: Secondary | ICD-10-CM

## 2022-11-08 MED ORDER — METFORMIN HCL ER 500 MG PO TB24: ORAL_TABLET | ORAL | 1 refills | Status: DC

## 2022-11-08 MED ORDER — AMLODIPINE BESYLATE 5 MG PO TABS: 5.0000 mg | ORAL_TABLET | Freq: Two times a day (BID) | ORAL | 1 refills | Status: DC

## 2022-11-08 MED ORDER — BUSPIRONE HCL 5 MG PO TABS: 5.0000 mg | ORAL_TABLET | Freq: Every day | ORAL | 1 refills | Status: DC

## 2022-11-08 MED ORDER — TELMISARTAN 80 MG PO TABS: 80.0000 mg | ORAL_TABLET | Freq: Every day | ORAL | 1 refills | Status: DC

## 2022-11-08 NOTE — Progress Notes (Signed)
Subjective:    Patient ID: Gabrielle Yu, female    DOB: 1950-10-02, 72 y.o.   MRN: [id number]  Patient here for  Chief Complaint  Patient presents with   Medical Management of Chronic Issues    HPI Here to follow up regarding her diabetes, hypertension and hypercholesterolemia. She is currently on telmisartan and amlodipine. Increased stress. Husband's health issues.  Her husband is doing better.  Handling stress.  Trying to stay physically active.  No chest pain or sob reported.  No abdominal pain or bowel change reported.  Blood pressure doing well - outside checks averaging - 120/60-70.  Discussed labs.    Past Medical History:  Diagnosis Date   Diabetes mellitus (HCC)    High cholesterol    Hypercholesterolemia    Hypertension    Past Surgical History:  Procedure Laterality Date   BREAST BIOPSY Left 2014   core with clip   CATARACT EXTRACTION W/PHACO Right 01/24/2017   Procedure: CATARACT EXTRACTION PHACO AND INTRAOCULAR LENS PLACEMENT (IOC) RIGHT DIABETIC COMPLICATED;  Surgeon: Lockie Mola, MD;  Location: [facility];  Service: Ophthalmology;  Laterality: Right;  Diabetic - oral meds   TONSILLECTOMY AND ADENOIDECTOMY  1958   TUBAL LIGATION  1982   Family History  Problem Relation Age of Onset   Breast cancer Mother 82   Hypertension Mother    Heart disease Father        myocardial infarction - died age 38   Heart disease Other        paternal aunts and uncles   Colon cancer Neg Hx    Social History   Socioeconomic History   Marital status: Married    Spouse name: Not on file   Number of children: 2   Years of education: Not on file   Highest education level: Not on file  Occupational History    Employer: [employer]  Tobacco Use   Smoking status: Never   Smokeless tobacco: Never  Vaping Use   Vaping status: Never Used  Substance and Sexual Activity   Alcohol use: No    Alcohol/week: 0.0 standard drinks of  alcohol   Drug use: No   Sexual activity: Not on file  Other Topics Concern   Not on file  Social History Narrative   Not on file   Social Determinants of Health   Financial Resource Strain: Low Risk  (09/26/2022)   Overall Financial Resource Strain (CARDIA)    Difficulty of Paying Living Expenses: Not hard at all  Food Insecurity: No Food Insecurity (09/26/2022)   Hunger Vital Sign    Worried About Running Out of Food in the Last Year: Never true    Ran Out of Food in the Last Year: Never true  Transportation Needs: No Transportation Needs (09/26/2022)   PRAPARE - Administrator, Civil Service (Medical): No    Lack of Transportation (Non-Medical): No  Physical Activity: Insufficiently Active (09/26/2022)   Exercise Vital Sign    Days of Exercise per Week: 2 days    Minutes of Exercise per Session: 20 min  Stress: No Stress Concern Present (09/26/2022)   [employer] of Occupational Health - Occupational Stress Questionnaire    Feeling of Stress : Not at all  Social Connections: Moderately Integrated (09/26/2022)   Social Connection and Isolation Panel [NHANES]    Frequency of Communication with Friends and Family: Three times a week    Frequency of Social Gatherings with Friends and  Family: More than three times a week    Attends Religious Services: Never    Active Member of Clubs or Organizations: Yes    Attends Banker Meetings: Never    Marital Status: Married     Review of Systems  Constitutional:  Negative for appetite change and unexpected weight change.  HENT:  Negative for congestion and sinus pressure.   Respiratory:  Negative for cough, chest tightness and shortness of breath.   Cardiovascular:  Negative for chest pain, palpitations and leg swelling.  Gastrointestinal:  Negative for abdominal pain, diarrhea, nausea and vomiting.  Genitourinary:  Negative for difficulty urinating and dysuria.  Musculoskeletal:  Negative for joint swelling and  myalgias.  Skin:  Negative for color change and rash.  Neurological:  Negative for dizziness and headaches.  Psychiatric/Behavioral:  Negative for agitation and dysphoric mood.        Objective:     BP 126/64   Pulse 86   Temp 97.9 F (36.6 C)   Resp 16   Ht 5\' 4"  (1.626 m)   Wt 156 lb (70.8 kg)   SpO2 98%   BMI 26.78 kg/m  Wt Readings from Last 3 Encounters:  11/08/22 156 lb (70.8 kg)  09/26/22 155 lb (70.3 kg)  08/25/22 155 lb 9.6 oz (70.6 kg)    Physical Exam Vitals reviewed.  Constitutional:      General: She is not in acute distress.    Appearance: Normal appearance.  HENT:     Head: Normocephalic and atraumatic.     Right Ear: External ear normal.     Left Ear: External ear normal.  Eyes:     General: No scleral icterus.       Right eye: No discharge.        Left eye: No discharge.     Conjunctiva/sclera: Conjunctivae normal.  Neck:     Thyroid: No thyromegaly.  Cardiovascular:     Rate and Rhythm: Normal rate and regular rhythm.  Pulmonary:     Effort: No respiratory distress.     Breath sounds: Normal breath sounds. No wheezing.  Abdominal:     General: Bowel sounds are normal.     Palpations: Abdomen is soft.     Tenderness: There is no abdominal tenderness.  Musculoskeletal:        General: No swelling or tenderness.     Cervical back: Neck supple. No tenderness.  Lymphadenopathy:     Cervical: No cervical adenopathy.  Skin:    Findings: No erythema or rash.  Neurological:     Mental Status: She is alert.  Psychiatric:        Mood and Affect: Mood normal.        Behavior: Behavior normal.      Outpatient Encounter Medications as of 11/08/2022  Medication Sig   amLODipine (NORVASC) 5 MG tablet Take 1 tablet (5 mg total) by mouth 2 (two) times daily.   busPIRone (BUSPAR) 5 MG tablet Take 1 tablet (5 mg total) by mouth daily.   glucose blood test strip Use as instructed to check blood sugars once daily   JARDIANCE 25 MG TABS tablet TAKE 1  TABLET BY MOUTH EVERY DAY   metFORMIN (GLUCOPHAGE-XR) 500 MG 24 hr tablet Take 2 tablets bid   metroNIDAZOLE (METROGEL) 1 % gel APPLY TO AFFECTED AREA TOPICALLY EVERY DAY   rosuvastatin (CRESTOR) 20 MG tablet Take 1 tablet (20 mg total) by mouth daily.   telmisartan (MICARDIS) 80 MG tablet  Take 1 tablet (80 mg total) by mouth daily.   [DISCONTINUED] amLODipine (NORVASC) 5 MG tablet Take 1 tablet (5 mg total) by mouth 2 (two) times daily.   [DISCONTINUED] busPIRone (BUSPAR) 5 MG tablet Take 1 tablet (5 mg total) by mouth daily.   [DISCONTINUED] hydrochlorothiazide (MICROZIDE) 12.5 MG capsule TAKE 1 CAPSULE BY MOUTH EVERY DAY (Patient not taking: Reported on 09/26/2022)   [DISCONTINUED] metFORMIN (GLUCOPHAGE-XR) 500 MG 24 hr tablet Take 2 tablets bid   [DISCONTINUED] telmisartan (MICARDIS) 80 MG tablet Take 1 tablet (80 mg total) by mouth daily.   No facility-administered encounter medications on file as of 11/08/2022.     Lab Results  Component Value Date   WBC 5.7 11/17/2021   HGB 12.7 11/17/2021   HCT 39.2 11/17/2021   PLT 209.0 11/17/2021   GLUCOSE 112 (H) 11/07/2022   CHOL 118 11/06/2022   TRIG 82.0 11/06/2022   HDL 45.10 11/06/2022   LDLDIRECT 157.1 01/21/2013   LDLCALC 57 11/06/2022   ALT 23 11/06/2022   AST 21 11/06/2022   NA 140 11/07/2022   K 3.6 11/07/2022   CL 106 11/07/2022   CREATININE 0.90 11/07/2022   BUN 10 11/07/2022   CO2 25 11/07/2022   TSH 4.49 02/28/2022   HGBA1C 6.9 (H) 11/06/2022   MICROALBUR 0.7 11/06/2022    MM 3D SCREEN BREAST BILATERAL  Result Date: 03/15/2022 CLINICAL DATA:  Screening. EXAM: DIGITAL SCREENING BILATERAL MAMMOGRAM WITH TOMOSYNTHESIS AND CAD TECHNIQUE: Bilateral screening digital craniocaudal and mediolateral oblique mammograms were obtained. Bilateral screening digital breast tomosynthesis was performed. The images were evaluated with computer-aided detection. COMPARISON:  Previous exam(s). ACR Breast Density Category b: There are  scattered areas of fibroglandular density. FINDINGS: There are no findings suspicious for malignancy. IMPRESSION: No mammographic evidence of malignancy. A result letter of this screening mammogram will be mailed directly to the patient. RECOMMENDATION: Screening mammogram in one year. (Code:SM-B-01Y) BI-RADS CATEGORY  1: Negative. Electronically Signed   By: Romona Curls M.D.   On: 03/15/2022 14:43   DG Bone Density  Result Date: 03/13/2022 EXAM: DUAL X-RAY ABSORPTIOMETRY (DXA) FOR BONE MINERAL DENSITY IMPRESSION: Your patient Santresa Bohlinger completed a BMD test on 03/13/2022 using the [employer] iDXA DXA System (software version: 14.10) manufactured by [employer]. The following summarizes the results of our evaluation. Technologist::TNB PATIENT BIOGRAPHICAL: Name: Marim, Klopp Patient ID: [id number] Birth Date: [birth year] Height: 64.0 in. Gender: Female Exam Date: 03/13/2022 Weight: 159.4 lbs. Indications: Caucasian, Diabetic, Low Calcium Intake, Postmenopausal Fractures: Treatments: DENSITOMETRY RESULTS: Site      Region     Measured Date Measured Age WHO Classification Young Adult T-score BMD         %Change vs. Previous Significant Change (*) AP Spine L1-L4 3/13/2022 71.7 Normal 1.0 1.316 g/cm2 - - DualFemur Neck Right 3/13/2022 71.7 Normal -0.5 0.963 g/cm2 - - ASSESSMENT: The BMD measured at Femur Neck Right is 0.963 g/cm2 with a T-score of -0.5. This patient is considered normal according to World Health Organization [facility]) criteria. Patient is not a candidate for FRAX due to normal bone density exam. The scan quality is good. World Science writer [facility]) criteria for post-menopausal, Caucasian Women: Normal:                   T-score at or above -1 SD Osteopenia/low bone mass: T-score between -1 and -2.5 SD Osteoporosis:             T-score at or below -2.5 SD RECOMMENDATIONS:  1. All patients should optimize calcium and vitamin D intake. 2. Consider FDA-approved medical therapies in  postmenopausal women and men aged 35 years and older, based on the following: a. A hip or vertebral(clinical or morphometric) fracture b. T-score < -2.5 at the femoral neck or spine after appropriate evaluation to exclude secondary causes c. Low bone mass (T-score between -1.0 and -2.5 at the femoral neck or spine) and a 10-year probability of a hip fracture > 3% or a 10-year probability of a major osteoporosis-related fracture > 20% based on the [place]-adapted WHO algorithm 3. Clinician judgment and/or patient preferences may indicate treatment for people with 10-year fracture probabilities above or below these levels FOLLOW-UP: People with diagnosed cases of osteoporosis or at high risk for fracture should have regular bone mineral density tests. For patients eligible for Medicare, routine testing is allowed once every 2 years. The testing frequency can be increased to one year for patients who have rapidly progressing disease, those who are receiving or discontinuing medical therapy to restore bone mass, or have additional risk factors. I have reviewed this report, and agree with the above findings. [facility] Electronically Signed   By: Frederico Hamman M.D.   On: 03/13/2022 10:44       Assessment & Plan:  Type 2 diabetes mellitus with hyperglycemia, without long-term current use of insulin (HCC) Assessment & Plan: Doing well on metformin XR.  Bowels better.  Continues jardiance.  Low carb diet and exercise.  Follow met b and A1c.   Orders: -     Hemoglobin A1c; Future  Hypercholesteremia Assessment & Plan: Continue crestor.  Low cholesterol diet and exercise.  Follow lipid panel and liver function tests.  Cholesterol doing well.  Lab Results  Component Value Date   CHOL 118 11/06/2022   HDL 45.10 11/06/2022   LDLCALC 57 11/06/2022   LDLDIRECT 157.1 01/21/2013   TRIG 82.0 11/06/2022   CHOLHDL 3 11/06/2022    Orders: -     Lipid panel; Future -     Hepatic function panel;  Future -     Basic metabolic panel; Future  Primary hypertension Assessment & Plan: On micardis and amlodipine 5mg  bid.   Blood pressure appears to be doing well as outlined. Follow pressures.  Follow metabolic panel. Hold on making changes.    Orders: -     TSH; Future -     CBC with Differential/Platelet; Future  Stress Assessment & Plan: Overall appears to be handling things relatively well as outlined. Follow.    Other orders -     amLODIPine Besylate; Take 1 tablet (5 mg total) by mouth 2 (two) times daily.  Dispense: 180 tablet; Refill: 1 -     busPIRone HCl; Take 1 tablet (5 mg total) by mouth daily.  Dispense: 90 tablet; Refill: 1 -     metFORMIN HCl ER; Take 2 tablets bid  Dispense: 360 tablet; Refill: 1 -     Telmisartan; Take 1 tablet (80 mg total) by mouth daily.  Dispense: 90 tablet; Refill: 1     Dale Custer, MD

## 2022-11-11 ENCOUNTER — Encounter: Payer: Self-pay | Admitting: Internal Medicine

## 2022-11-11 NOTE — Assessment & Plan Note (Signed)
On micardis and amlodipine 5mg  bid.   Blood pressure appears to be doing well as outlined. Follow pressures.  Follow metabolic panel. Hold on making changes.

## 2022-11-11 NOTE — Assessment & Plan Note (Signed)
Doing well on metformin XR.  Bowels better.  Continues jardiance.  Low carb diet and exercise.  Follow met b and A1c.

## 2022-11-11 NOTE — Assessment & Plan Note (Signed)
Overall appears to be handling things relatively well as outlined. Follow.

## 2022-11-11 NOTE — Assessment & Plan Note (Signed)
Continue crestor.  Low cholesterol diet and exercise.  Follow lipid panel and liver function tests.  Cholesterol doing well.  Lab Results  Component Value Date   CHOL 118 11/06/2022   HDL 45.10 11/06/2022   LDLCALC 57 11/06/2022   LDLDIRECT 157.1 01/21/2013   TRIG 82.0 11/06/2022   CHOLHDL 3 11/06/2022

## 2023-02-08 ENCOUNTER — Other Ambulatory Visit: Payer: Self-pay | Admitting: Internal Medicine

## 2023-02-16 ENCOUNTER — Other Ambulatory Visit: Payer: Self-pay | Admitting: Internal Medicine

## 2023-03-09 ENCOUNTER — Other Ambulatory Visit (INDEPENDENT_AMBULATORY_CARE_PROVIDER_SITE_OTHER): Payer: Medicare Other

## 2023-03-09 DIAGNOSIS — Z7984 Long term (current) use of oral hypoglycemic drugs: Secondary | ICD-10-CM | POA: Diagnosis not present

## 2023-03-09 DIAGNOSIS — E1165 Type 2 diabetes mellitus with hyperglycemia: Secondary | ICD-10-CM | POA: Diagnosis not present

## 2023-03-09 DIAGNOSIS — E7800 Pure hypercholesterolemia, unspecified: Secondary | ICD-10-CM

## 2023-03-09 DIAGNOSIS — I10 Essential (primary) hypertension: Secondary | ICD-10-CM

## 2023-03-09 LAB — CBC WITH DIFFERENTIAL/PLATELET
Basophils Absolute: 0.1 10*3/uL (ref 0.0–0.1)
Basophils Relative: 1 % (ref 0.0–3.0)
Eosinophils Absolute: 0 10*3/uL (ref 0.0–0.7)
Eosinophils Relative: 0 % (ref 0.0–5.0)
HCT: 37.1 % (ref 36.0–46.0)
Hemoglobin: 11.9 g/dL — ABNORMAL LOW (ref 12.0–15.0)
Lymphocytes Relative: 24.9 % (ref 12.0–46.0)
Lymphs Abs: 1.7 10*3/uL (ref 0.7–4.0)
MCHC: 32.2 g/dL (ref 30.0–36.0)
MCV: 87.5 fL (ref 78.0–100.0)
Monocytes Absolute: 0.7 10*3/uL (ref 0.1–1.0)
Monocytes Relative: 10.1 % (ref 3.0–12.0)
Neutro Abs: 4.3 10*3/uL (ref 1.4–7.7)
Neutrophils Relative %: 64 % (ref 43.0–77.0)
Platelets: 229 10*3/uL (ref 150.0–400.0)
RBC: 4.24 Mil/uL (ref 3.87–5.11)
RDW: 14.4 % (ref 11.5–15.5)
WBC: 6.8 10*3/uL (ref 4.0–10.5)

## 2023-03-09 LAB — BASIC METABOLIC PANEL
BUN: 14 mg/dL (ref 6–23)
CO2: 27 meq/L (ref 19–32)
Calcium: 9.2 mg/dL (ref 8.4–10.5)
Chloride: 107 meq/L (ref 96–112)
Creatinine, Ser: 0.98 mg/dL (ref 0.40–1.20)
GFR: 57.57 mL/min — ABNORMAL LOW (ref >60.00)
Glucose, Bld: 97 mg/dL (ref 70–99)
Potassium: 3.8 meq/L (ref 3.5–5.1)
Sodium: 143 meq/L (ref 135–145)

## 2023-03-09 LAB — LIPID PANEL
Cholesterol: 125 mg/dL (ref 0–200)
HDL: 40.8 mg/dL (ref >39.00)
LDL Cholesterol: 64 mg/dL (ref 0–99)
NonHDL: 84.66
Total CHOL/HDL Ratio: 3
Triglycerides: 104 mg/dL (ref 0.0–149.0)
VLDL: 20.8 mg/dL (ref 0.0–40.0)

## 2023-03-09 LAB — HEMOGLOBIN A1C: Hgb A1c MFr Bld: 7.1 % — ABNORMAL HIGH (ref 4.6–6.5)

## 2023-03-09 LAB — HEPATIC FUNCTION PANEL
ALT: 18 U/L (ref 0–35)
AST: 18 U/L (ref 0–37)
Albumin: 4.4 g/dL (ref 3.5–5.2)
Alkaline Phosphatase: 61 U/L (ref 39–117)
Bilirubin, Direct: 0.1 mg/dL (ref 0.0–0.3)
Total Bilirubin: 0.7 mg/dL (ref 0.2–1.2)
Total Protein: 6.6 g/dL (ref 6.0–8.3)

## 2023-03-09 LAB — TSH: TSH: 4.03 u[IU]/mL (ref 0.35–5.50)

## 2023-03-14 ENCOUNTER — Ambulatory Visit: Payer: Medicare Other | Admitting: Internal Medicine

## 2023-03-14 VITALS — BP 124/68 | HR 89 | Temp 98.0°F | Resp 16 | Ht 64.0 in | Wt 155.8 lb

## 2023-03-14 DIAGNOSIS — I10 Essential (primary) hypertension: Secondary | ICD-10-CM

## 2023-03-14 DIAGNOSIS — Z0000 Encounter for general adult medical examination without abnormal findings: Secondary | ICD-10-CM

## 2023-03-14 DIAGNOSIS — E1165 Type 2 diabetes mellitus with hyperglycemia: Secondary | ICD-10-CM

## 2023-03-14 DIAGNOSIS — F439 Reaction to severe stress, unspecified: Secondary | ICD-10-CM

## 2023-03-14 DIAGNOSIS — D649 Anemia, unspecified: Secondary | ICD-10-CM

## 2023-03-14 DIAGNOSIS — E7800 Pure hypercholesterolemia, unspecified: Secondary | ICD-10-CM

## 2023-03-14 DIAGNOSIS — Z1231 Encounter for screening mammogram for malignant neoplasm of breast: Secondary | ICD-10-CM

## 2023-03-14 DIAGNOSIS — Z7984 Long term (current) use of oral hypoglycemic drugs: Secondary | ICD-10-CM

## 2023-03-14 LAB — HM DIABETES FOOT EXAM

## 2023-03-14 MED ORDER — METRONIDAZOLE 0.75 % EX GEL: 1.0000 | Freq: Two times a day (BID) | CUTANEOUS | 0 refills | Status: AC

## 2023-03-14 NOTE — Progress Notes (Signed)
Subjective:    Patient ID: Gabrielle Yu, female    DOB: Jul 12, 1950, 72 y.o.   MRN: [id number]  Patient here for  Chief Complaint  Patient presents with   Annual Exam    HPI With past history of diabetes, hypertension and hypercholesterolemia - comes in today to follow up on these issues as well as for a complete physical exam. She is currently on telmisartan and amlodipine. Had been having increased stress. Husband's health issues. Husband is doing better.  She stays active.  No chest pain or sob reported.  No abdominal pain or bowel change reported. Discussed labs.  A1c 7.1.  drinking mountain dew.  Not watching diet as well.  Discussed diet and exercise. Desires not to add medication at this time.  She is taking her sugars - 120 fasting and before evening meal.     Past Medical History:  Diagnosis Date   Diabetes mellitus (HCC)    High cholesterol    Hypercholesterolemia    Hypertension    Past Surgical History:  Procedure Laterality Date   BREAST BIOPSY Left 2014   core with clip   CATARACT EXTRACTION W/PHACO Right 01/24/2017   Procedure: CATARACT EXTRACTION PHACO AND INTRAOCULAR LENS PLACEMENT (IOC) RIGHT DIABETIC COMPLICATED;  Surgeon: Lockie Mola, MD;  Location: [facility];  Service: Ophthalmology;  Laterality: Right;  Diabetic - oral meds   TONSILLECTOMY AND ADENOIDECTOMY  1958   TUBAL LIGATION  1982   Family History  Problem Relation Age of Onset   Breast cancer Mother 44   Hypertension Mother    Heart disease Father        myocardial infarction - died age 20   Heart disease Other        paternal aunts and uncles   Colon cancer Neg Hx    Social History   Socioeconomic History   Marital status: Married    Spouse name: Not on file   Number of children: 2   Years of education: Not on file   Highest education level: Not on file  Occupational History    Employer: [employer]  Tobacco Use   Smoking status: Never    Smokeless tobacco: Never  Vaping Use   Vaping status: Never Used  Substance and Sexual Activity   Alcohol use: No    Alcohol/week: 0.0 standard drinks of alcohol   Drug use: No   Sexual activity: Not on file  Other Topics Concern   Not on file  Social History Narrative   Not on file   Social Determinants of Health   Financial Resource Strain: Low Risk  (09/26/2022)   Overall Financial Resource Strain (CARDIA)    Difficulty of Paying Living Expenses: Not hard at all  Food Insecurity: No Food Insecurity (09/26/2022)   Hunger Vital Sign    Worried About Running Out of Food in the Last Year: Never true    Ran Out of Food in the Last Year: Never true  Transportation Needs: No Transportation Needs (09/26/2022)   PRAPARE - Administrator, Civil Service (Medical): No    Lack of Transportation (Non-Medical): No  Physical Activity: Insufficiently Active (09/26/2022)   Exercise Vital Sign    Days of Exercise per Week: 2 days    Minutes of Exercise per Session: 20 min  Stress: No Stress Concern Present (09/26/2022)   [employer] of Occupational Health - Occupational Stress Questionnaire    Feeling of Stress : Not at all  Social Connections: Moderately Integrated (09/26/2022)   Social Connection and Isolation Panel [NHANES]    Frequency of Communication with Friends and Family: Three times a week    Frequency of Social Gatherings with Friends and Family: More than three times a week    Attends Religious Services: Never    Database administrator or Organizations: Yes    Attends Banker Meetings: Never    Marital Status: Married     Review of Systems  Constitutional:  Negative for appetite change and unexpected weight change.  HENT:  Negative for congestion, sinus pressure and sore throat.   Eyes:  Negative for pain and visual disturbance.  Respiratory:  Negative for cough, chest tightness and shortness of breath.   Cardiovascular:  Negative for chest pain,  palpitations and leg swelling.  Gastrointestinal:  Negative for abdominal pain, diarrhea, nausea and vomiting.  Genitourinary:  Negative for difficulty urinating and dysuria.  Musculoskeletal:  Negative for joint swelling and myalgias.  Skin:  Negative for color change and rash.  Neurological:  Negative for dizziness and headaches.  Hematological:  Negative for adenopathy. Does not bruise/bleed easily.  Psychiatric/Behavioral:  Negative for agitation and dysphoric mood.        Objective:     BP 124/68   Pulse 89   Temp 98 F (36.7 C)   Resp 16   Ht 5\' 4"  (1.626 m)   Wt 155 lb 12.8 oz (70.7 kg)   SpO2 98%   BMI 26.74 kg/m  Wt Readings from Last 3 Encounters:  03/14/23 155 lb 12.8 oz (70.7 kg)  11/08/22 156 lb (70.8 kg)  09/26/22 155 lb (70.3 kg)    Physical Exam Vitals reviewed.  Constitutional:      General: She is not in acute distress.    Appearance: Normal appearance. She is well-developed.  HENT:     Head: Normocephalic and atraumatic.     Right Ear: External ear normal.     Left Ear: External ear normal.  Eyes:     General: No scleral icterus.       Right eye: No discharge.        Left eye: No discharge.     Conjunctiva/sclera: Conjunctivae normal.  Neck:     Thyroid: No thyromegaly.  Cardiovascular:     Rate and Rhythm: Normal rate and regular rhythm.  Pulmonary:     Effort: No tachypnea, accessory muscle usage or respiratory distress.     Breath sounds: Normal breath sounds. No decreased breath sounds or wheezing.  Chest:  Breasts:    Right: No inverted nipple, mass, nipple discharge or tenderness (no axillary adenopathy).     Left: No inverted nipple, mass, nipple discharge or tenderness (no axilarry adenopathy).  Abdominal:     General: Bowel sounds are normal.     Palpations: Abdomen is soft.     Tenderness: There is no abdominal tenderness.  Musculoskeletal:        General: No swelling or tenderness.     Cervical back: Neck supple.   Lymphadenopathy:     Cervical: No cervical adenopathy.  Skin:    Findings: No erythema or rash.  Neurological:     Mental Status: She is alert and oriented to person, place, and time.  Psychiatric:        Mood and Affect: Mood normal.        Behavior: Behavior normal.   i   Outpatient Encounter Medications as of 03/14/2023  Medication Sig  metroNIDAZOLE (METROGEL) 0.75 % gel Apply 1 Application topically 2 (two) times daily.   amLODipine (NORVASC) 5 MG tablet TAKE 1 TABLET BY MOUTH TWICE A DAY   busPIRone (BUSPAR) 5 MG tablet Take 1 tablet (5 mg total) by mouth daily.   glucose blood test strip Use as instructed to check blood sugars once daily   JARDIANCE 25 MG TABS tablet TAKE 1 TABLET BY MOUTH EVERY DAY   metFORMIN (GLUCOPHAGE-XR) 500 MG 24 hr tablet Take 2 tablets bid   rosuvastatin (CRESTOR) 20 MG tablet Take 1 tablet (20 mg total) by mouth daily.   telmisartan (MICARDIS) 80 MG tablet Take 1 tablet (80 mg total) by mouth daily.   [DISCONTINUED] metroNIDAZOLE (METROGEL) 1 % gel APPLY TO AFFECTED AREA TOPICALLY EVERY DAY   No facility-administered encounter medications on file as of 03/14/2023.     Lab Results  Component Value Date   WBC 6.8 03/09/2023   HGB 11.9 (L) 03/09/2023   HCT 37.1 03/09/2023   PLT 229.0 03/09/2023   GLUCOSE 97 03/09/2023   CHOL 125 03/09/2023   TRIG 104.0 03/09/2023   HDL 40.80 03/09/2023   LDLDIRECT 157.1 01/21/2013   LDLCALC 64 03/09/2023   ALT 18 03/09/2023   AST 18 03/09/2023   NA 143 03/09/2023   K 3.8 03/09/2023   CL 107 03/09/2023   CREATININE 0.98 03/09/2023   BUN 14 03/09/2023   CO2 27 03/09/2023   TSH 4.03 03/09/2023   HGBA1C 7.1 (H) 03/09/2023   MICROALBUR 0.7 11/06/2022    MM 3D SCREEN BREAST BILATERAL  Result Date: 03/15/2022 CLINICAL DATA:  Screening. EXAM: DIGITAL SCREENING BILATERAL MAMMOGRAM WITH TOMOSYNTHESIS AND CAD TECHNIQUE: Bilateral screening digital craniocaudal and mediolateral oblique mammograms were  obtained. Bilateral screening digital breast tomosynthesis was performed. The images were evaluated with computer-aided detection. COMPARISON:  Previous exam(s). ACR Breast Density Category b: There are scattered areas of fibroglandular density. FINDINGS: There are no findings suspicious for malignancy. IMPRESSION: No mammographic evidence of malignancy. A result letter of this screening mammogram will be mailed directly to the patient. RECOMMENDATION: Screening mammogram in one year. (Code:SM-B-01Y) BI-RADS CATEGORY  1: Negative. Electronically Signed   By: Romona Curls M.D.   On: 03/15/2022 14:43   DG Bone Density  Result Date: 03/13/2022 EXAM: DUAL X-RAY ABSORPTIOMETRY (DXA) FOR BONE MINERAL DENSITY IMPRESSION: Your patient Sullivan Backhus completed a BMD test on 03/13/2022 using the [employer] iDXA DXA System (software version: 14.10) manufactured by [employer]. The following summarizes the results of our evaluation. Technologist::TNB PATIENT BIOGRAPHICAL: Name: Shabree, Cribb Patient ID: [id number] Birth Date: 1950/05/01 Height: 64.0 in. Gender: Female Exam Date: 03/13/2022 Weight: 159.4 lbs. Indications: Caucasian, Diabetic, Low Calcium Intake, Postmenopausal Fractures: Treatments: DENSITOMETRY RESULTS: Site      Region     Measured Date Measured Age WHO Classification Young Adult T-score BMD         %Change vs. Previous Significant Change (*) AP Spine L1-L4 03/13/2022 71.7 Normal 1.0 1.316 g/cm2 - - DualFemur Neck Right 3/13/2022 71.7 Normal -0.5 0.963 g/cm2 - - ASSESSMENT: The BMD measured at Femur Neck Right is 0.963 g/cm2 with a T-score of -0.5. This patient is considered normal according to World Health Organization [facility]) criteria. Patient is not a candidate for FRAX due to normal bone density exam. The scan quality is good. World Health Organization [facility]) criteria for post-menopausal, Caucasian Women: Normal:  T-score at or above -1 SD Osteopenia/low bone mass: T-score  between -1 and -2.5 SD Osteoporosis:             T-score at or below -2.5 SD RECOMMENDATIONS: 1. All patients should optimize calcium and vitamin D intake. 2. Consider FDA-approved medical therapies in postmenopausal women and men aged 64 years and older, based on the following: a. A hip or vertebral(clinical or morphometric) fracture b. T-score < -2.5 at the femoral neck or spine after appropriate evaluation to exclude secondary causes c. Low bone mass (T-score between -1.0 and -2.5 at the femoral neck or spine) and a 10-year probability of a hip fracture > 3% or a 10-year probability of a major osteoporosis-related fracture > 20% based on the [place]-adapted WHO algorithm 3. Clinician judgment and/or patient preferences may indicate treatment for people with 10-year fracture probabilities above or below these levels FOLLOW-UP: People with diagnosed cases of osteoporosis or at high risk for fracture should have regular bone mineral density tests. For patients eligible for Medicare, routine testing is allowed once every 2 years. The testing frequency can be increased to one year for patients who have rapidly progressing disease, those who are receiving or discontinuing medical therapy to restore bone mass, or have additional risk factors. I have reviewed this report, and agree with the above findings. [facility] Electronically Signed   By: Frederico Hamman M.D.   On: 03/13/2022 10:44       Assessment & Plan:  Anemia, unspecified type Assessment & Plan: Recent hgb just slightly decreased.  Will follow.  Recheck cbc and iron studies and B12 - non fasting lab.   Orders: -     CBC with Differential/Platelet; Future -     Basic metabolic panel; Future -     Vitamin B12; Future -     IBC + Ferritin; Future  Visit for screening mammogram -     3D Screening Mammogram, Left and Right; Future  Type 2 diabetes mellitus with hyperglycemia, without long-term current use of insulin (HCC) Assessment  & Plan: On metformin XR.  Bowels better.  Continues jardiance.  Low carb diet and exercise.  Follow met b and A1c. Recent check 7.1.  has been drinking an increased amount of mountain dew.  Discussed decreasing. Diet and exercise.  Wants to hold on additional medication.  Follow.    Stress Assessment & Plan: Overall appears to be handling things relatively well as outlined. Follow.    Primary hypertension Assessment & Plan: On micardis and amlodipine 5mg  bid.   Blood pressure appears to be doing well as outlined. Follow pressures.  Follow metabolic panel. Hold on making changes.     Hypercholesteremia Assessment & Plan: Continue crestor.  Low cholesterol diet and exercise.  Follow lipid panel and liver function tests.  Cholesterol doing well.  Lab Results  Component Value Date   CHOL 125 03/09/2023   HDL 40.80 03/09/2023   LDLCALC 64 03/09/2023   LDLDIRECT 157.1 01/21/2013   TRIG 104.0 03/09/2023   CHOLHDL 3 03/09/2023     Health care maintenance Assessment & Plan: Physical today 11/05/20.  Mammogram 02/10/22 - birads I.  Declines colonoscopy. Discussed again today.  She declines.    Other orders -     metroNIDAZOLE; Apply 1 Application topically 2 (two) times daily.  Dispense: 45 g; Refill: 0     Dale Wilkinson, MD

## 2023-03-18 ENCOUNTER — Encounter: Payer: Self-pay | Admitting: Internal Medicine

## 2023-03-18 DIAGNOSIS — D649 Anemia, unspecified: Secondary | ICD-10-CM | POA: Insufficient documentation

## 2023-03-18 NOTE — Assessment & Plan Note (Signed)
Overall appears to be handling things relatively well as outlined. Follow.

## 2023-03-18 NOTE — Assessment & Plan Note (Signed)
Recent hgb just slightly decreased.  Will follow.  Recheck cbc and iron studies and B12 - non fasting lab.

## 2023-03-18 NOTE — Assessment & Plan Note (Signed)
On micardis and amlodipine 5mg  bid.   Blood pressure appears to be doing well as outlined. Follow pressures.  Follow metabolic panel. Hold on making changes.

## 2023-03-18 NOTE — Assessment & Plan Note (Signed)
Physical today 11/05/20.  Mammogram 02/10/22 - birads I.  Declines colonoscopy. Discussed again today.  She declines.

## 2023-03-18 NOTE — Assessment & Plan Note (Signed)
On metformin XR.  Bowels better.  Continues jardiance.  Low carb diet and exercise.  Follow met b and A1c. Recent check 7.1.  has been drinking an increased amount of mountain dew.  Discussed decreasing. Diet and exercise.  Wants to hold on additional medication.  Follow.

## 2023-03-18 NOTE — Assessment & Plan Note (Signed)
Continue crestor.  Low cholesterol diet and exercise.  Follow lipid panel and liver function tests.  Cholesterol doing well.  Lab Results  Component Value Date   CHOL 125 03/09/2023   HDL 40.80 03/09/2023   LDLCALC 64 03/09/2023   LDLDIRECT 157.1 01/21/2013   TRIG 104.0 03/09/2023   CHOLHDL 3 03/09/2023

## 2023-04-11 ENCOUNTER — Other Ambulatory Visit (INDEPENDENT_AMBULATORY_CARE_PROVIDER_SITE_OTHER): Payer: Medicare Other

## 2023-04-11 ENCOUNTER — Ambulatory Visit
Admission: RE | Admit: 2023-04-11 | Discharge: 2023-04-11 | Disposition: A | Discharge status: Home / Self Care | Payer: Medicare Other | Source: Ambulatory Visit | Attending: Internal Medicine | Admitting: Internal Medicine

## 2023-04-11 DIAGNOSIS — D649 Anemia, unspecified: Secondary | ICD-10-CM

## 2023-04-11 DIAGNOSIS — Z1231 Encounter for screening mammogram for malignant neoplasm of breast: Secondary | ICD-10-CM | POA: Insufficient documentation

## 2023-04-11 LAB — CBC WITH DIFFERENTIAL/PLATELET
Basophils Absolute: 0.1 10*3/uL (ref 0.0–0.1)
Basophils Relative: 1.1 % (ref 0.0–3.0)
Eosinophils Absolute: 0 10*3/uL (ref 0.0–0.7)
Eosinophils Relative: 0 % (ref 0.0–5.0)
HCT: 36.1 % (ref 36.0–46.0)
Hemoglobin: 11.8 g/dL — ABNORMAL LOW (ref 12.0–15.0)
Lymphocytes Relative: 25 % (ref 12.0–46.0)
Lymphs Abs: 1.3 10*3/uL (ref 0.7–4.0)
MCHC: 32.7 g/dL (ref 30.0–36.0)
MCV: 88.3 fL (ref 78.0–100.0)
Monocytes Absolute: 0.5 10*3/uL (ref 0.1–1.0)
Monocytes Relative: 8.6 % (ref 3.0–12.0)
Neutro Abs: 3.4 10*3/uL (ref 1.4–7.7)
Neutrophils Relative %: 65.3 % (ref 43.0–77.0)
Platelets: 235 10*3/uL (ref 150.0–400.0)
RBC: 4.09 Mil/uL (ref 3.87–5.11)
RDW: 14.5 % (ref 11.5–15.5)
WBC: 5.2 10*3/uL (ref 4.0–10.5)

## 2023-04-11 LAB — BASIC METABOLIC PANEL
BUN: 10 mg/dL (ref 6–23)
CO2: 27 meq/L (ref 19–32)
Calcium: 8.8 mg/dL (ref 8.4–10.5)
Chloride: 106 meq/L (ref 96–112)
Creatinine, Ser: 0.94 mg/dL (ref 0.40–1.20)
GFR: 60.48 mL/min (ref >60.00)
Glucose, Bld: 195 mg/dL — ABNORMAL HIGH (ref 70–99)
Potassium: 3.9 meq/L (ref 3.5–5.1)
Sodium: 142 meq/L (ref 135–145)

## 2023-04-11 LAB — IBC + FERRITIN
Ferritin: 21.3 ng/mL (ref 10.0–291.0)
Iron: 68 ug/dL (ref 42–145)
Saturation Ratios: 16.9 % — ABNORMAL LOW (ref 20.0–50.0)
TIBC: 403.2 ug/dL (ref 250.0–450.0)
Transferrin: 288 mg/dL (ref 212.0–360.0)

## 2023-04-11 LAB — VITAMIN B12: Vitamin B-12: 503 pg/mL (ref 211–911)

## 2023-05-30 ENCOUNTER — Other Ambulatory Visit: Payer: Self-pay | Admitting: Internal Medicine

## 2023-07-16 ENCOUNTER — Other Ambulatory Visit: Payer: Medicare Other

## 2023-07-18 ENCOUNTER — Ambulatory Visit: Payer: Medicare Other | Admitting: Internal Medicine

## 2023-07-20 ENCOUNTER — Other Ambulatory Visit: Payer: Self-pay | Admitting: Internal Medicine

## 2023-08-23 IMAGING — MG MM DIGITAL SCREENING BILAT W/ TOMO AND CAD
6 of 10 series · 6 of 30 positions shown · non-contrast
Comparison: Previous exam(s).

CLINICAL DATA: Screening.

EXAM:
DIGITAL SCREENING BILATERAL MAMMOGRAM WITH TOMOSYNTHESIS AND CAD
TECHNIQUE: Bilateral screening digital craniocaudal and mediolateral oblique
mammograms were obtained. Bilateral screening digital breast
tomosynthesis was performed. The images were evaluated with
computer-aided detection.

[L MLO synth-2D (1 of 2)]
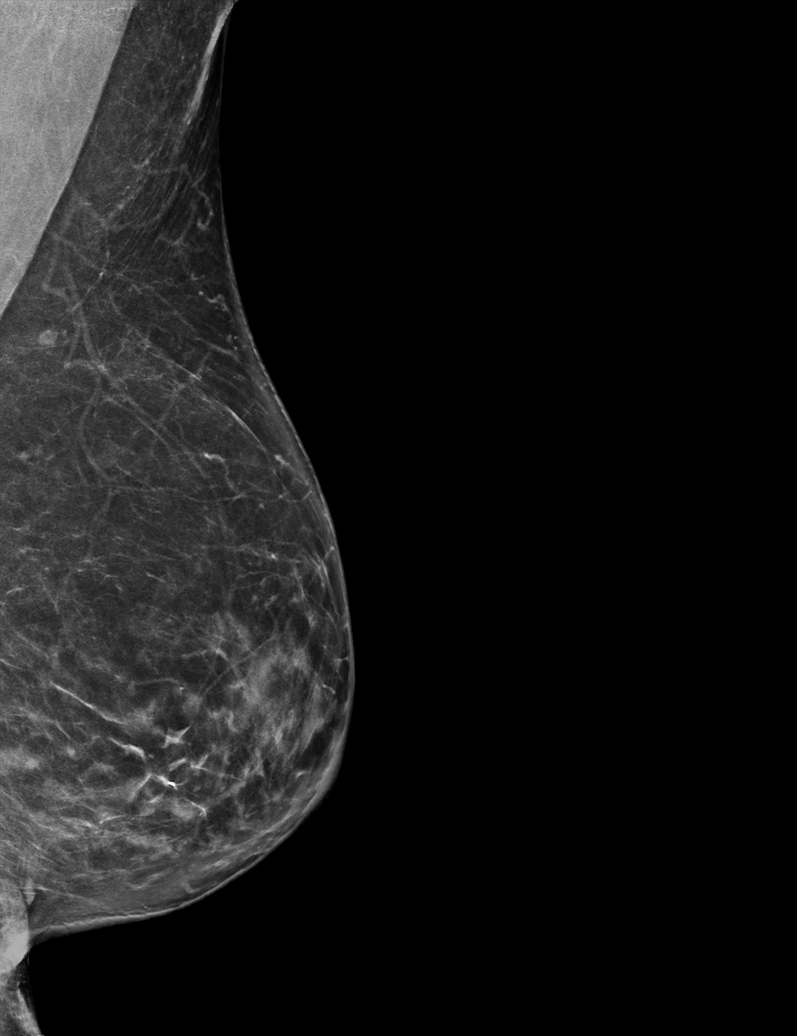

[R MLO synth-2D]
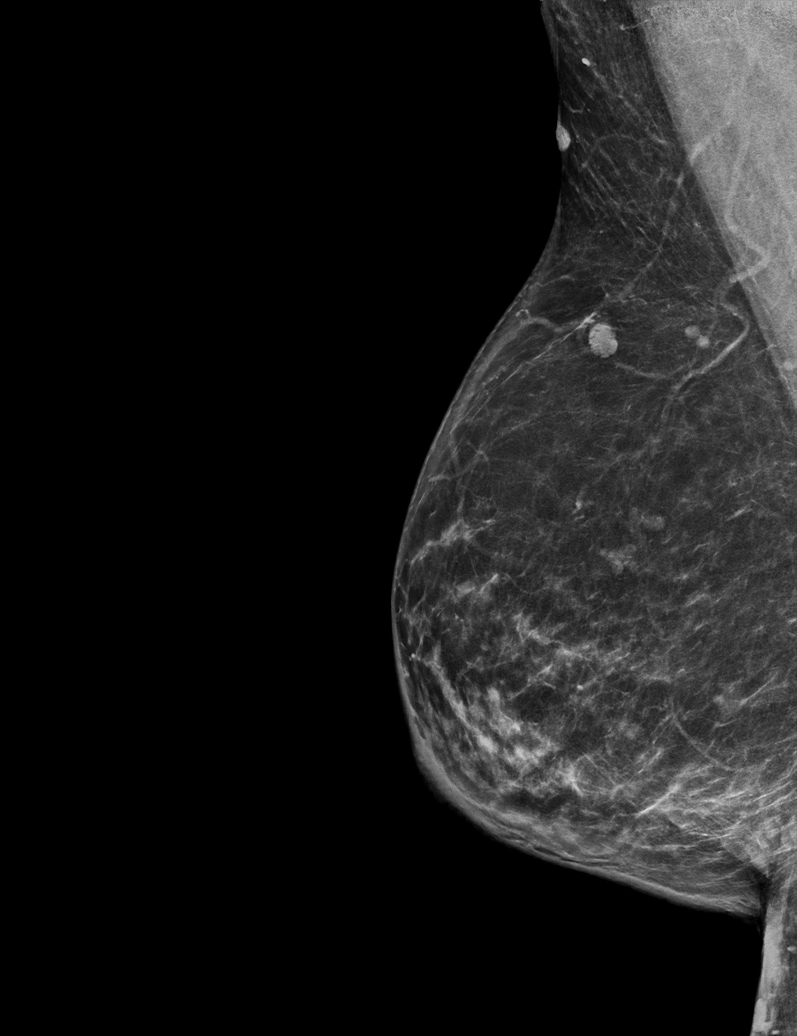

[L MLO synth-2D (2 of 2)]
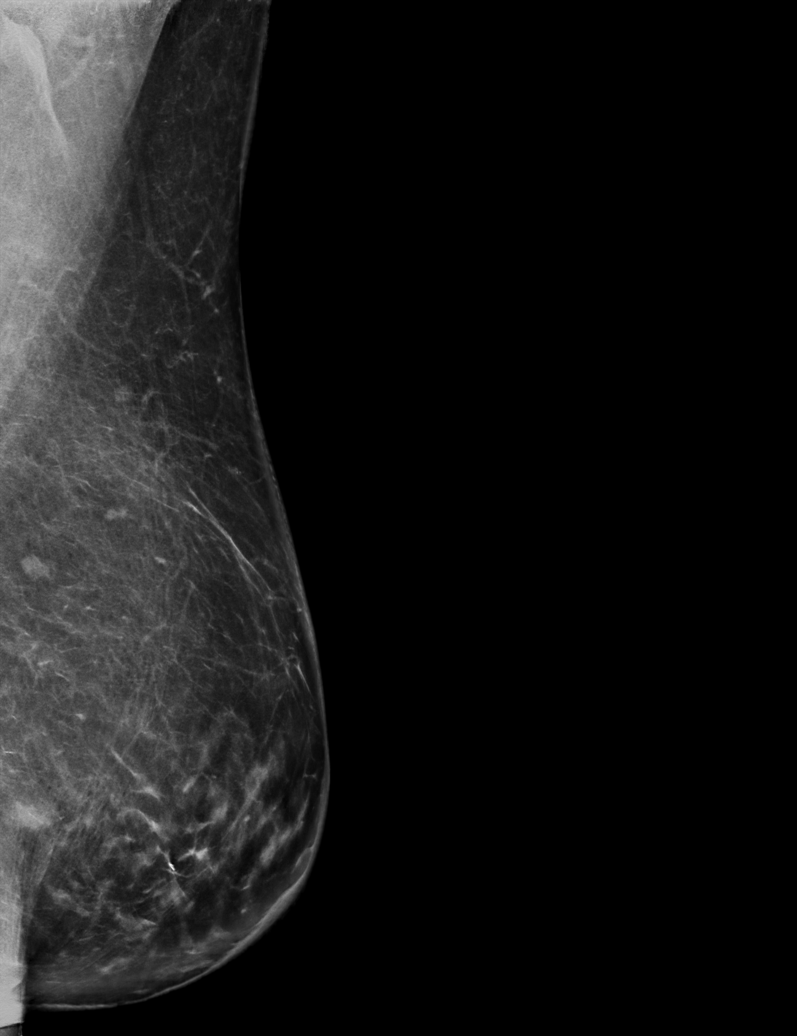

[L CC synth-2D]
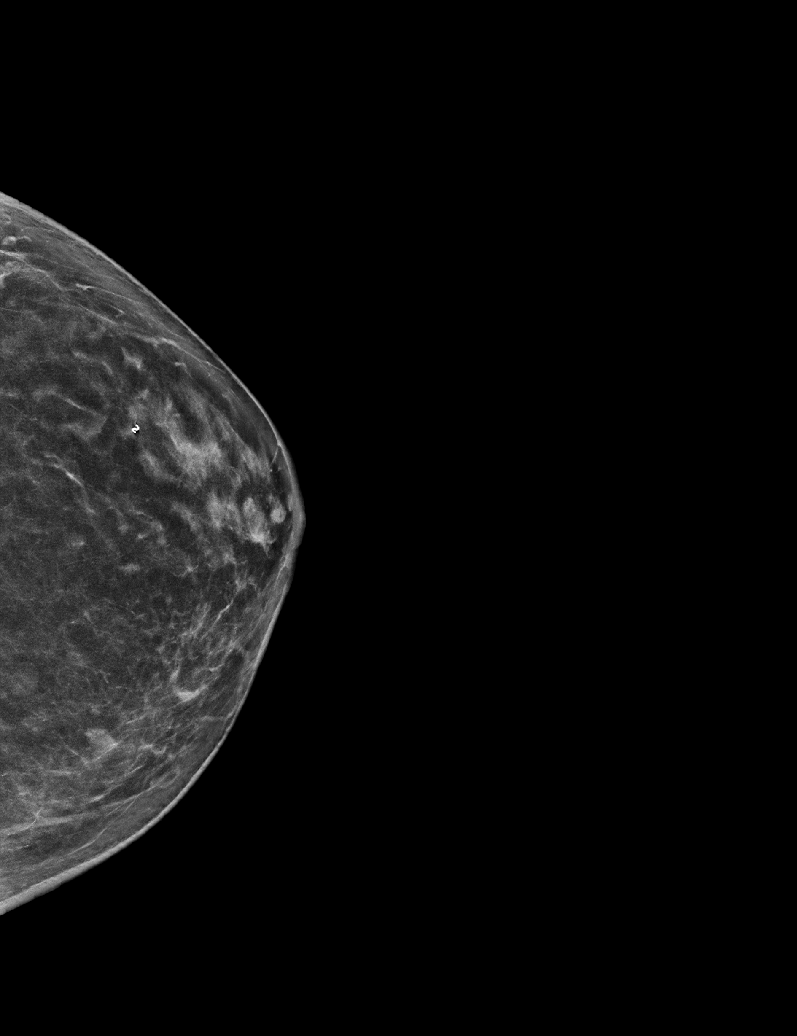

[R CC synth-2D]
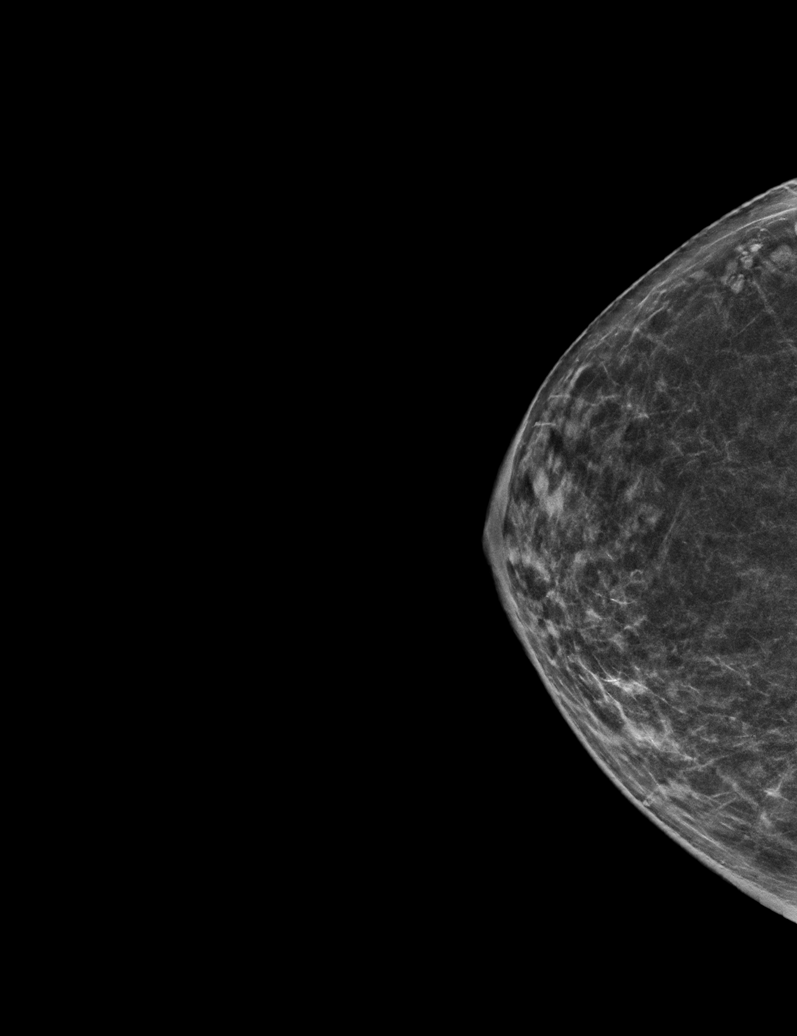

[R CC tomo · tomo slice 30/59.0]
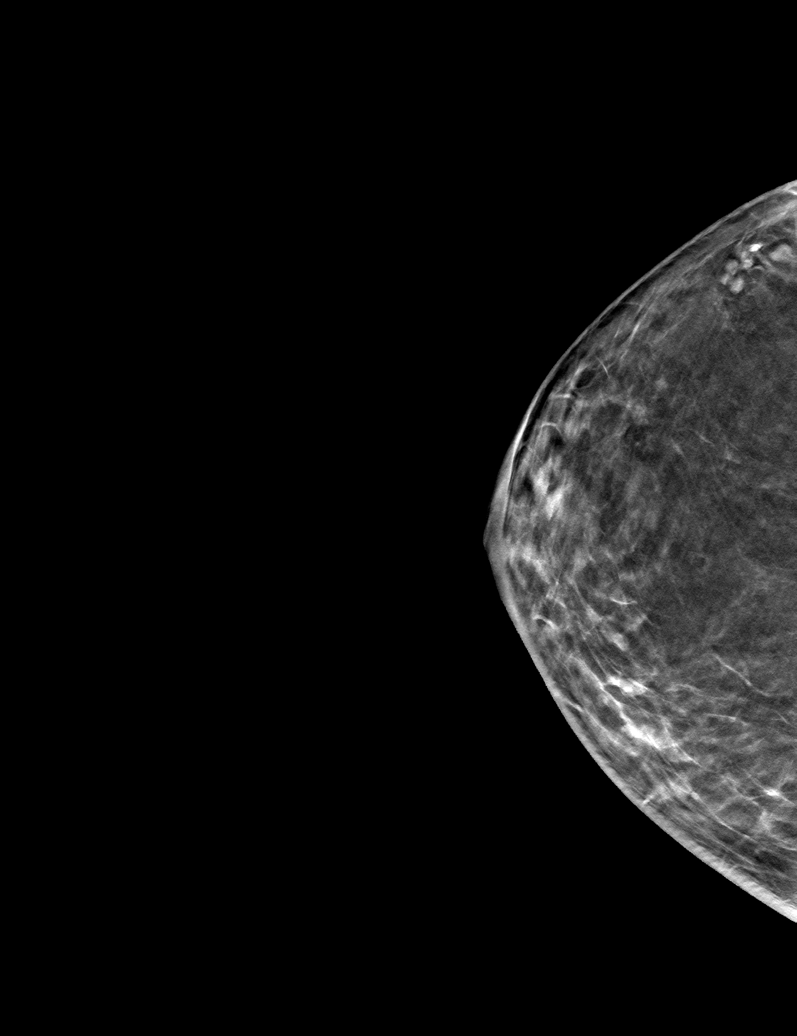

[6 of 30 positions shown; findings below may reference images not displayed]

ACR Breast Density Category b: There are scattered areas of
fibroglandular density.
FINDINGS: There are no findings suspicious for malignancy.
IMPRESSION: No mammographic evidence of malignancy. A result letter of this
screening mammogram will be mailed directly to the patient.

RECOMMENDATION:
Screening mammogram in one year. (Code:51-O-LD2)

BI-RADS CATEGORY  1: Negative.

## 2023-08-27 ENCOUNTER — Other Ambulatory Visit: Payer: Self-pay | Admitting: Internal Medicine

## 2023-09-06 ENCOUNTER — Other Ambulatory Visit: Payer: Self-pay

## 2023-09-06 DIAGNOSIS — E1165 Type 2 diabetes mellitus with hyperglycemia: Secondary | ICD-10-CM

## 2023-09-06 DIAGNOSIS — D649 Anemia, unspecified: Secondary | ICD-10-CM

## 2023-09-06 DIAGNOSIS — E7800 Pure hypercholesterolemia, unspecified: Secondary | ICD-10-CM

## 2023-09-14 ENCOUNTER — Other Ambulatory Visit: Payer: Self-pay | Admitting: Internal Medicine

## 2023-09-14 ENCOUNTER — Other Ambulatory Visit (INDEPENDENT_AMBULATORY_CARE_PROVIDER_SITE_OTHER)

## 2023-09-14 ENCOUNTER — Ambulatory Visit: Payer: Self-pay | Admitting: Internal Medicine

## 2023-09-14 DIAGNOSIS — D649 Anemia, unspecified: Secondary | ICD-10-CM

## 2023-09-14 DIAGNOSIS — E1165 Type 2 diabetes mellitus with hyperglycemia: Secondary | ICD-10-CM

## 2023-09-14 DIAGNOSIS — E7800 Pure hypercholesterolemia, unspecified: Secondary | ICD-10-CM | POA: Diagnosis not present

## 2023-09-14 LAB — LIPID PANEL
Cholesterol: 125 mg/dL (ref 0–200)
HDL: 47.3 mg/dL (ref >39.00)
LDL Cholesterol: 56 mg/dL (ref 0–99)
NonHDL: 77.32
Total CHOL/HDL Ratio: 3
Triglycerides: 105 mg/dL (ref 0.0–149.0)
VLDL: 21 mg/dL (ref 0.0–40.0)

## 2023-09-14 LAB — HEPATIC FUNCTION PANEL
ALT: 26 U/L (ref 0–35)
AST: 19 U/L (ref 0–37)
Albumin: 4.6 g/dL (ref 3.5–5.2)
Alkaline Phosphatase: 64 U/L (ref 39–117)
Bilirubin, Direct: 0.1 mg/dL (ref 0.0–0.3)
Total Bilirubin: 0.5 mg/dL (ref 0.2–1.2)
Total Protein: 6.9 g/dL (ref 6.0–8.3)

## 2023-09-14 LAB — BASIC METABOLIC PANEL WITH GFR
BUN: 10 mg/dL (ref 6–23)
CO2: 29 meq/L (ref 19–32)
Calcium: 9.2 mg/dL (ref 8.4–10.5)
Chloride: 106 meq/L (ref 96–112)
Creatinine, Ser: 0.84 mg/dL (ref 0.40–1.20)
GFR: 69.01 mL/min (ref >60.00)
Glucose, Bld: 113 mg/dL — ABNORMAL HIGH (ref 70–99)
Potassium: 4 meq/L (ref 3.5–5.1)
Sodium: 144 meq/L (ref 135–145)

## 2023-09-14 LAB — CBC WITH DIFFERENTIAL/PLATELET
Basophils Absolute: 0.1 10*3/uL (ref 0.0–0.1)
Basophils Relative: 1.2 % (ref 0.0–3.0)
Eosinophils Absolute: 0 10*3/uL (ref 0.0–0.7)
Eosinophils Relative: 0 % (ref 0.0–5.0)
HCT: 36.2 % (ref 36.0–46.0)
Hemoglobin: 11.8 g/dL — ABNORMAL LOW (ref 12.0–15.0)
Lymphocytes Relative: 27.9 % (ref 12.0–46.0)
Lymphs Abs: 1.5 10*3/uL (ref 0.7–4.0)
MCHC: 32.6 g/dL (ref 30.0–36.0)
MCV: 85.6 fl (ref 78.0–100.0)
Monocytes Absolute: 0.5 10*3/uL (ref 0.1–1.0)
Monocytes Relative: 10.1 % (ref 3.0–12.0)
Neutro Abs: 3.2 10*3/uL (ref 1.4–7.7)
Neutrophils Relative %: 60.8 % (ref 43.0–77.0)
Platelets: 209 10*3/uL (ref 150.0–400.0)
RBC: 4.23 Mil/uL (ref 3.87–5.11)
RDW: 14.4 % (ref 11.5–15.5)
WBC: 5.3 10*3/uL (ref 4.0–10.5)

## 2023-09-14 LAB — HEMOGLOBIN A1C: Hgb A1c MFr Bld: 7 % — ABNORMAL HIGH (ref 4.6–6.5)

## 2023-09-14 NOTE — Progress Notes (Signed)
Order placed for add on lab (iron studies)

## 2023-09-18 ENCOUNTER — Telehealth: Payer: Self-pay | Admitting: Internal Medicine

## 2023-09-19 ENCOUNTER — Ambulatory Visit (INDEPENDENT_AMBULATORY_CARE_PROVIDER_SITE_OTHER): Admitting: Internal Medicine

## 2023-09-19 ENCOUNTER — Encounter: Payer: Self-pay | Admitting: Internal Medicine

## 2023-09-19 VITALS — BP 126/70 | HR 83 | Temp 97.9°F | Resp 16 | Ht 64.0 in | Wt 155.0 lb

## 2023-09-19 DIAGNOSIS — I10 Essential (primary) hypertension: Secondary | ICD-10-CM

## 2023-09-19 DIAGNOSIS — E7800 Pure hypercholesterolemia, unspecified: Secondary | ICD-10-CM | POA: Diagnosis not present

## 2023-09-19 DIAGNOSIS — E1165 Type 2 diabetes mellitus with hyperglycemia: Secondary | ICD-10-CM

## 2023-09-19 DIAGNOSIS — R945 Abnormal results of liver function studies: Secondary | ICD-10-CM

## 2023-09-19 DIAGNOSIS — Z7984 Long term (current) use of oral hypoglycemic drugs: Secondary | ICD-10-CM

## 2023-09-19 NOTE — Assessment & Plan Note (Signed)
 Continue diet and exercise. Recent liver panel wnl.

## 2023-09-19 NOTE — Assessment & Plan Note (Addendum)
 On metformin  XR.  Bowels better.  Continues jardiance .  Low carb diet and exercise.  Follow met b and A1c. Recent check 7.0.  discussed other treatment options, specifically GLP 1 agonist. Desires not to do an injection. Discussed rybelsus. She wants to hold on starting a new medication at this time. Will notify me if desires to start.

## 2023-09-19 NOTE — Assessment & Plan Note (Signed)
 On micardis  and amlodipine  5mg  bid.   Blood pressure appears to be doing well as outlined. Follow pressures.  Follow metabolic panel. No changes in medication.

## 2023-09-19 NOTE — Progress Notes (Signed)
 Subjective:    Patient ID: Gabrielle Yu, female    DOB: 10-10-1950, 73 y.o.   MRN: [id number]  Patient here for  Chief Complaint  Patient presents with   Medical Management of Chronic Issues    HPI Here for a scheduled follow up - follow up regarding diabetes, hypertension and hypercholesterolemia. Continues on telmisartan  and amlodipine . On metfromin and jardaince. Tolerating her current dose of metformin . Unable to increase the dose. Does not check sugars regularly. When she checks it is mostly in am and sugars are 90-110. Discussed labs. A1c 7.0. discussed low carb diet and exercise. No chest pain or sob. No abdominal pain or bowel change. Discussed treatment options for her sugars. Discussed GLP 1 agonist.    Past Medical History:  Diagnosis Date   Diabetes mellitus (HCC)    High cholesterol    Hypercholesterolemia    Hypertension    Past Surgical History:  Procedure Laterality Date   BREAST BIOPSY Left 2014   core with clip   CATARACT EXTRACTION W/PHACO Right 01/24/2017   Procedure: CATARACT EXTRACTION PHACO AND INTRAOCULAR LENS PLACEMENT (IOC) RIGHT DIABETIC COMPLICATED;  Surgeon: Annell Kidney, MD;  Location: [facility];  Service: Ophthalmology;  Laterality: Right;  Diabetic - oral meds   TONSILLECTOMY AND ADENOIDECTOMY  1958   TUBAL LIGATION  1982   Family History  Problem Relation Age of Onset   Breast cancer Mother 60   Hypertension Mother    Heart disease Father        myocardial infarction - died age 81   Heart disease Other        paternal aunts and uncles   Colon cancer Neg Hx    Social History   Socioeconomic History   Marital status: Married    Spouse name: Not on file   Number of children: 2   Years of education: Not on file   Highest education level: Not on file  Occupational History    Employer: [employer]  Tobacco Use   Smoking status: Never   Smokeless tobacco: Never  Vaping Use   Vaping status:  Never Used  Substance and Sexual Activity   Alcohol use: No    Alcohol/week: 0.0 standard drinks of alcohol   Drug use: No   Sexual activity: Not on file  Other Topics Concern   Not on file  Social History Narrative   Not on file   Social Drivers of Health   Financial Resource Strain: Low Risk  (09/26/2022)   Overall Financial Resource Strain (CARDIA)    Difficulty of Paying Living Expenses: Not hard at all  Food Insecurity: No Food Insecurity (09/26/2022)   Hunger Vital Sign    Worried About Running Out of Food in the Last Year: Never true    Ran Out of Food in the Last Year: Never true  Transportation Needs: No Transportation Needs (09/26/2022)   PRAPARE - Administrator, Civil Service (Medical): No    Lack of Transportation (Non-Medical): No  Physical Activity: Insufficiently Active (09/26/2022)   Exercise Vital Sign    Days of Exercise per Week: 2 days    Minutes of Exercise per Session: 20 min  Stress: No Stress Concern Present (09/26/2022)   [employer] of Occupational Health - Occupational Stress Questionnaire    Feeling of Stress : Not at all  Social Connections: Moderately Integrated (09/26/2022)   Social Connection and Isolation Panel [NHANES]    Frequency of Communication with Friends  and Family: Three times a week    Frequency of Social Gatherings with Friends and Family: More than three times a week    Attends Religious Services: Never    Database administrator or Organizations: Yes    Attends Banker Meetings: Never    Marital Status: Married     Review of Systems  Constitutional:  Negative for appetite change and unexpected weight change.  HENT:  Negative for congestion and sinus pressure.   Respiratory:  Negative for cough, chest tightness and shortness of breath.   Cardiovascular:  Negative for chest pain, palpitations and leg swelling.  Gastrointestinal:  Negative for abdominal pain, diarrhea, nausea and vomiting.  Genitourinary:   Negative for difficulty urinating and dysuria.  Musculoskeletal:  Negative for joint swelling and myalgias.  Skin:  Negative for color change and rash.  Neurological:  Negative for dizziness and headaches.  Psychiatric/Behavioral:  Negative for agitation and dysphoric mood.        Objective:     BP 126/70   Pulse 83   Temp 97.9 F (36.6 C)   Resp 16   Ht 5\' 4"  (1.626 m)   Wt 155 lb (70.3 kg)   SpO2 98%   BMI 26.61 kg/m  Wt Readings from Last 3 Encounters:  09/19/23 155 lb (70.3 kg)  03/14/23 155 lb 12.8 oz (70.7 kg)  11/08/22 156 lb (70.8 kg)    Physical Exam Vitals reviewed.  Constitutional:      General: She is not in acute distress.    Appearance: Normal appearance.  HENT:     Head: Normocephalic and atraumatic.     Right Ear: External ear normal.     Left Ear: External ear normal.     Mouth/Throat:     Pharynx: No oropharyngeal exudate or posterior oropharyngeal erythema.  Eyes:     General: No scleral icterus.       Right eye: No discharge.        Left eye: No discharge.     Conjunctiva/sclera: Conjunctivae normal.  Neck:     Thyroid : No thyromegaly.  Cardiovascular:     Rate and Rhythm: Normal rate and regular rhythm.  Pulmonary:     Effort: No respiratory distress.     Breath sounds: Normal breath sounds. No wheezing.  Abdominal:     General: Bowel sounds are normal.     Palpations: Abdomen is soft.     Tenderness: There is no abdominal tenderness.  Musculoskeletal:        General: No swelling or tenderness.     Cervical back: Neck supple. No tenderness.  Lymphadenopathy:     Cervical: No cervical adenopathy.  Skin:    Findings: No erythema or rash.  Neurological:     Mental Status: She is alert.  Psychiatric:        Mood and Affect: Mood normal.        Behavior: Behavior normal.         Outpatient Encounter Medications as of 09/19/2023  Medication Sig   amLODipine  (NORVASC ) 5 MG tablet TAKE 1 TABLET BY MOUTH TWICE A DAY   busPIRone   (BUSPAR ) 5 MG tablet Take 1 tablet (5 mg total) by mouth daily.   glucose blood test strip Use as instructed to check blood sugars once daily   JARDIANCE  25 MG TABS tablet TAKE 1 TABLET BY MOUTH EVERY DAY   metFORMIN  (GLUCOPHAGE -XR) 500 MG 24 hr tablet TAKE 2 TABLETS BY MOUTH TWICE A DAY  metroNIDAZOLE  (METROGEL ) 0.75 % gel Apply 1 Application topically 2 (two) times daily.   rosuvastatin  (CRESTOR ) 20 MG tablet TAKE 1 TABLET BY MOUTH EVERY DAY   telmisartan  (MICARDIS ) 80 MG tablet TAKE 1 TABLET BY MOUTH EVERY DAY   No facility-administered encounter medications on file as of 09/19/2023.     Lab Results  Component Value Date   WBC 5.3 09/14/2023   HGB 11.8 (L) 09/14/2023   HCT 36.2 09/14/2023   PLT 209.0 09/14/2023   GLUCOSE 113 (H) 09/14/2023   CHOL 125 09/14/2023   TRIG 105.0 09/14/2023   HDL 47.30 09/14/2023   LDLDIRECT 157.1 01/21/2013   LDLCALC 56 09/14/2023   ALT 26 09/14/2023   AST 19 09/14/2023   NA 144 09/14/2023   K 4.0 09/14/2023   CL 106 09/14/2023   CREATININE 0.84 09/14/2023   BUN 10 09/14/2023   CO2 29 09/14/2023   TSH 4.03 03/09/2023   HGBA1C 7.0 (H) 09/14/2023   MICROALBUR 0.7 11/06/2022    MM 3D SCREENING MAMMOGRAM BILATERAL BREAST Result Date: 04/13/2023 CLINICAL DATA:  Screening. EXAM: DIGITAL SCREENING BILATERAL MAMMOGRAM WITH TOMOSYNTHESIS AND CAD TECHNIQUE: Bilateral screening digital craniocaudal and mediolateral oblique mammograms were obtained. Bilateral screening digital breast tomosynthesis was performed. The images were evaluated with computer-aided detection. COMPARISON:  Previous exam(s). ACR Breast Density Category b: There are scattered areas of fibroglandular density. FINDINGS: There are no findings suspicious for malignancy. IMPRESSION: No mammographic evidence of malignancy. A result letter of this screening mammogram will be mailed directly to the patient. RECOMMENDATION: Screening mammogram in one year. (Code:SM-B-01Y) BI-RADS CATEGORY  1:  Negative. Electronically Signed   By: Dina  Arceo M.D.   On: 04/13/2023 13:13       Assessment & Plan:  Abnormal liver function Assessment & Plan: Continue diet and exercise. Recent liver panel wnl.    Hypercholesteremia Assessment & Plan: Continue crestor .  Low cholesterol diet and exercise.  Follow lipid panel and liver function tests.  Cholesterol doing well. No changes in medication.  Lab Results  Component Value Date   CHOL 125 09/14/2023   HDL 47.30 09/14/2023   LDLCALC 56 09/14/2023   LDLDIRECT 157.1 01/21/2013   TRIG 105.0 09/14/2023   CHOLHDL 3 09/14/2023    Orders: -     Lipid panel; Future -     Hepatic function panel; Future -     Basic metabolic panel with GFR; Future  Type 2 diabetes mellitus with hyperglycemia, without long-term current use of insulin (HCC) Assessment & Plan: On metformin  XR.  Bowels better.  Continues jardiance .  Low carb diet and exercise.  Follow met b and A1c. Recent check 7.0.  discussed other treatment options, specifically GLP 1 agonist. Desires not to do an injection. Discussed rybelsus. She wants to hold on starting a new medication at this time. Will notify me if desires to start.   Orders: -     Hemoglobin A1c; Future  Primary hypertension Assessment & Plan: On micardis  and amlodipine  5mg  bid.   Blood pressure appears to be doing well as outlined. Follow pressures.  Follow metabolic panel. No changes in medication.       Dellar Fenton, MD

## 2023-09-19 NOTE — Assessment & Plan Note (Signed)
 Continue crestor .  Low cholesterol diet and exercise.  Follow lipid panel and liver function tests.  Cholesterol doing well. No changes in medication.  Lab Results  Component Value Date   CHOL 125 09/14/2023   HDL 47.30 09/14/2023   LDLCALC 56 09/14/2023   LDLDIRECT 157.1 01/21/2013   TRIG 105.0 09/14/2023   CHOLHDL 3 09/14/2023

## 2023-09-20 NOTE — Telephone Encounter (Signed)
 Gabrielle Yu

## 2023-11-01 ENCOUNTER — Other Ambulatory Visit: Payer: Self-pay | Admitting: Internal Medicine

## 2023-11-23 ENCOUNTER — Other Ambulatory Visit: Payer: Self-pay | Admitting: Internal Medicine

## 2023-12-25 ENCOUNTER — Other Ambulatory Visit: Payer: Self-pay | Admitting: Internal Medicine

## 2023-12-25 MED ORDER — GLUCOSE BLOOD VI STRP: ORAL_STRIP | 12 refills | Status: DC

## 2023-12-25 NOTE — Telephone Encounter (Signed)
 Copied from CRM [id number]. Topic: Clinical - Medication Refill >> Dec 25, 2023 10:15 AM [place] wrote: Medication: glucose blood test strip  Has the patient contacted their pharmacy? Yes (Agent: If no, request that the patient contact the pharmacy for the refill. If patient does not wish to contact the pharmacy document the reason why and proceed with request.) (Agent: If yes, when and what did the pharmacy advise?)  This is the patient's preferred pharmacy:  [id number] [place], [place] - [address] [address] Phone: [phone] Fax: [phone]  Is this the correct pharmacy for this prescription? Yes If no, delete pharmacy and type the correct one.   Has the prescription been filled recently? Yes  Is the patient out of the medication? Yes  Has the patient been seen for an appointment in the last year OR does the patient have an upcoming appointment? Yes  Can we respond through MyChart? Yes  Agent: Please be advised that Rx refills may take up to 3 business days. We ask that you follow-up with your pharmacy.

## 2023-12-27 ENCOUNTER — Telehealth: Payer: Self-pay

## 2023-12-27 NOTE — Telephone Encounter (Unsigned)
 Copied from CRM #[id number]. Topic: Clinical - Prescription Issue >> Dec 27, 2023  2:43 PM [place] ORN wrote: Reason for CRM: Test Strips >> Dec 27, 2023  2:45 PM Pinkey ORN wrote: Patient states the pharmacy sent back her prescription for her test strips, said something about needing the provider to complete some kind of form. Patient is wanting to know if that's been completed and sent back to the pharmacy.

## 2023-12-27 NOTE — Telephone Encounter (Signed)
 Gabrielle Yu, CMA    12/27/23  2:54 PM Unsigned Note Copied from CRM #[id number]. Topic: Clinical - Prescription Issue >> Dec 27, 2023  2:43 PM Pinkey ORN wrote: Reason for CRM: Test Strips >> Dec 27, 2023  2:45 PM Pinkey ORN wrote: Patient states the pharmacy sent back her prescription for her test strips, said something about needing the provider to complete some kind of form. Patient is wanting to know if that's been completed and sent back to the pharmacy.

## 2023-12-28 ENCOUNTER — Other Ambulatory Visit: Payer: Self-pay

## 2023-12-28 MED ORDER — GLUCOSE BLOOD VI STRP: ORAL_STRIP | 12 refills | Status: AC

## 2023-12-28 NOTE — Telephone Encounter (Signed)
 Rx corrected. Patient is aware.

## 2024-01-18 ENCOUNTER — Other Ambulatory Visit (INDEPENDENT_AMBULATORY_CARE_PROVIDER_SITE_OTHER)

## 2024-01-18 DIAGNOSIS — E1165 Type 2 diabetes mellitus with hyperglycemia: Secondary | ICD-10-CM

## 2024-01-18 DIAGNOSIS — E7800 Pure hypercholesterolemia, unspecified: Secondary | ICD-10-CM | POA: Diagnosis not present

## 2024-01-18 DIAGNOSIS — D649 Anemia, unspecified: Secondary | ICD-10-CM | POA: Diagnosis not present

## 2024-01-18 LAB — LIPID PANEL
Cholesterol: 109 mg/dL (ref 0–200)
HDL: 42.8 mg/dL (ref >39.00)
LDL Cholesterol: 50 mg/dL (ref 0–99)
NonHDL: 66.47
Total CHOL/HDL Ratio: 3
Triglycerides: 82 mg/dL (ref 0.0–149.0)
VLDL: 16.4 mg/dL (ref 0.0–40.0)

## 2024-01-18 LAB — HEPATIC FUNCTION PANEL
ALT: 18 U/L (ref 0–35)
AST: 16 U/L (ref 0–37)
Albumin: 4.4 g/dL (ref 3.5–5.2)
Alkaline Phosphatase: 57 U/L (ref 39–117)
Bilirubin, Direct: 0.1 mg/dL (ref 0.0–0.3)
Total Bilirubin: 0.5 mg/dL (ref 0.2–1.2)
Total Protein: 6.5 g/dL (ref 6.0–8.3)

## 2024-01-18 LAB — BASIC METABOLIC PANEL WITH GFR
BUN: 11 mg/dL (ref 6–23)
CO2: 31 meq/L (ref 19–32)
Calcium: 9.2 mg/dL (ref 8.4–10.5)
Chloride: 106 meq/L (ref 96–112)
Creatinine, Ser: 0.89 mg/dL (ref 0.40–1.20)
GFR: 64.23 mL/min (ref >60.00)
Glucose, Bld: 103 mg/dL — ABNORMAL HIGH (ref 70–99)
Potassium: 4 meq/L (ref 3.5–5.1)
Sodium: 145 meq/L (ref 135–145)

## 2024-01-18 LAB — IBC + FERRITIN
Ferritin: 26 ng/mL (ref 10.0–291.0)
Iron: 51 ug/dL (ref 42–145)
Saturation Ratios: 13.1 % — ABNORMAL LOW (ref 20.0–50.0)
TIBC: 389.2 ug/dL (ref 250.0–450.0)
Transferrin: 278 mg/dL (ref 212.0–360.0)

## 2024-01-18 LAB — HEMOGLOBIN A1C: Hgb A1c MFr Bld: 7.4 % — ABNORMAL HIGH (ref 4.6–6.5)

## 2024-01-21 ENCOUNTER — Other Ambulatory Visit

## 2024-01-21 ENCOUNTER — Ambulatory Visit: Payer: Self-pay | Admitting: Internal Medicine

## 2024-01-23 ENCOUNTER — Ambulatory Visit: Admitting: Internal Medicine

## 2024-01-23 ENCOUNTER — Encounter: Payer: Self-pay | Admitting: Internal Medicine

## 2024-01-23 VITALS — BP 120/68 | HR 89 | Resp 16 | Ht 64.0 in | Wt 154.4 lb

## 2024-01-23 DIAGNOSIS — I10 Essential (primary) hypertension: Secondary | ICD-10-CM

## 2024-01-23 DIAGNOSIS — D649 Anemia, unspecified: Secondary | ICD-10-CM

## 2024-01-23 DIAGNOSIS — Z7984 Long term (current) use of oral hypoglycemic drugs: Secondary | ICD-10-CM

## 2024-01-23 DIAGNOSIS — F439 Reaction to severe stress, unspecified: Secondary | ICD-10-CM

## 2024-01-23 DIAGNOSIS — E1165 Type 2 diabetes mellitus with hyperglycemia: Secondary | ICD-10-CM

## 2024-01-23 DIAGNOSIS — E7800 Pure hypercholesterolemia, unspecified: Secondary | ICD-10-CM

## 2024-01-23 MED ORDER — METFORMIN HCL ER 500 MG PO TB24: 1000.0000 mg | ORAL_TABLET | Freq: Two times a day (BID) | ORAL | 1 refills | Status: DC

## 2024-01-23 MED ORDER — AMLODIPINE BESYLATE 5 MG PO TABS: 5.0000 mg | ORAL_TABLET | Freq: Two times a day (BID) | ORAL | 1 refills | Status: DC

## 2024-01-23 MED ORDER — TELMISARTAN 80 MG PO TABS: 80.0000 mg | ORAL_TABLET | Freq: Every day | ORAL | 1 refills | Status: DC

## 2024-01-23 MED ORDER — LANCETS MISC: 11 refills | Status: AC

## 2024-01-23 NOTE — Progress Notes (Signed)
 Subjective:    Patient ID: Gabrielle Yu, female    DOB: [birth year], 73 y.o.   MRN: [id number]  Patient here for  Chief Complaint  Patient presents with   Medical Management of Chronic Issues    HPI Here for a scheduled follow up -  follow up regarding diabetes, hypertension and hypercholesterolemia. Continues on telmisartan  and amlodipine . On metfromin and jardaince. Tolerating her current dose of metformin . Unable to increase the dose  reviewed recent labs. A1c increased to 7.4. discussed with her regarding increased A1c. She has not been checking her sugars in the pm. States her checks are in the am and usually averages sugars 115-120. Has not been watching her diet. Increased stress with husband's medical issues. He has had recurrence of melanoma. Has surgery/testing planned for tomorrow. Has good support. No chest pain or sob reported. No abdominal pain or bowel change reported.     Past Medical History:  Diagnosis Date   Diabetes mellitus (HCC)    High cholesterol    Hypercholesterolemia    Hypertension    Past Surgical History:  Procedure Laterality Date   BREAST BIOPSY Left 2014   core with clip   CATARACT EXTRACTION W/PHACO Right 01/24/2017   Procedure: CATARACT EXTRACTION PHACO AND INTRAOCULAR LENS PLACEMENT (IOC) RIGHT DIABETIC COMPLICATED;  Surgeon: Mittie Gaskin, MD;  Location: [facility];  Service: Ophthalmology;  Laterality: Right;  Diabetic - oral meds   TONSILLECTOMY AND ADENOIDECTOMY  1958   TUBAL LIGATION  1982   Family History  Problem Relation Age of Onset   Breast cancer Mother 109   Hypertension Mother    Heart disease Father        myocardial infarction - died age 36   Heart disease Other        paternal aunts and uncles   Colon cancer Neg Hx    Social History   Socioeconomic History   Marital status: Married    Spouse name: Not on file   Number of children: 2   Years of education: Not on file   Highest education level: Not  on file  Occupational History    Employer: [employer]  Tobacco Use   Smoking status: Never   Smokeless tobacco: Never  Vaping Use   Vaping status: Never Used  Substance and Sexual Activity   Alcohol use: No    Alcohol/week: 0.0 standard drinks of alcohol   Drug use: No   Sexual activity: Not on file  Other Topics Concern   Not on file  Social History Narrative   Not on file   Social Drivers of Health   Financial Resource Strain: Low Risk  (09/26/2022)   Overall Financial Resource Strain (CARDIA)    Difficulty of Paying Living Expenses: Not hard at all  Food Insecurity: No Food Insecurity (09/26/2022)   Hunger Vital Sign    Worried About Running Out of Food in the Last Year: Never true    Ran Out of Food in the Last Year: Never true  Transportation Needs: No Transportation Needs (09/26/2022)   PRAPARE - Administrator, Civil Service (Medical): No    Lack of Transportation (Non-Medical): No  Physical Activity: Insufficiently Active (09/26/2022)   Exercise Vital Sign    Days of Exercise per Week: 2 days    Minutes of Exercise per Session: 20 min  Stress: No Stress Concern Present (09/26/2022)   [employer] of Occupational Health - Occupational Stress Questionnaire  Feeling of Stress : Not at all  Social Connections: Moderately Integrated (09/26/2022)   Social Connection and Isolation Panel    Frequency of Communication with Friends and Family: Three times a week    Frequency of Social Gatherings with Friends and Family: More than three times a week    Attends Religious Services: Never    Database administrator or Organizations: Yes    Attends Banker Meetings: Never    Marital Status: Married     Review of Systems  Constitutional:  Negative for appetite change and unexpected weight change.  HENT:  Negative for congestion and sinus pressure.   Respiratory:  Negative for cough, chest tightness and shortness of breath.    Cardiovascular:  Negative for chest pain, palpitations and leg swelling.  Gastrointestinal:  Negative for abdominal pain, diarrhea, nausea and vomiting.  Genitourinary:  Negative for difficulty urinating and dysuria.  Musculoskeletal:  Negative for joint swelling and myalgias.  Skin:  Negative for color change and rash.  Neurological:  Negative for dizziness and headaches.  Psychiatric/Behavioral:  Negative for agitation and dysphoric mood.        Objective:     BP 120/68   Pulse 89   Resp 16   Ht 5' 4 (1.626 m)   Wt 154 lb 6.4 oz (70 kg)   SpO2 98%   BMI 26.50 kg/m  Wt Readings from Last 3 Encounters:  01/23/24 154 lb 6.4 oz (70 kg)  09/19/23 155 lb (70.3 kg)  03/14/23 155 lb 12.8 oz (70.7 kg)    Physical Exam Vitals reviewed.  Constitutional:      General: She is not in acute distress.    Appearance: Normal appearance.  HENT:     Head: Normocephalic and atraumatic.     Right Ear: External ear normal.     Left Ear: External ear normal.     Mouth/Throat:     Pharynx: No oropharyngeal exudate or posterior oropharyngeal erythema.  Eyes:     General: No scleral icterus.       Right eye: No discharge.        Left eye: No discharge.     Conjunctiva/sclera: Conjunctivae normal.  Neck:     Thyroid : No thyromegaly.  Cardiovascular:     Rate and Rhythm: Normal rate and regular rhythm.  Pulmonary:     Effort: No respiratory distress.     Breath sounds: Normal breath sounds. No wheezing.  Abdominal:     General: Bowel sounds are normal.     Palpations: Abdomen is soft.     Tenderness: There is no abdominal tenderness.  Musculoskeletal:        General: No swelling or tenderness.     Cervical back: Neck supple. No tenderness.  Lymphadenopathy:     Cervical: No cervical adenopathy.  Skin:    Findings: No erythema or rash.  Neurological:     Mental Status: She is alert.  Psychiatric:        Mood and Affect: Mood normal.        Behavior: Behavior normal.          Outpatient Encounter Medications as of 01/23/2024  Medication Sig   Lancets MISC Use to check blood sugars once daily. Dx E11.9   amLODipine  (NORVASC ) 5 MG tablet Take 1 tablet (5 mg total) by mouth 2 (two) times daily.   busPIRone  (BUSPAR ) 5 MG tablet Take 1 tablet (5 mg total) by mouth daily.   glucose blood test  strip Use as instructed to check blood sugars once daily. Dx E11.9   JARDIANCE  25 MG TABS tablet TAKE 1 TABLET BY MOUTH EVERY DAY   metFORMIN  (GLUCOPHAGE -XR) 500 MG 24 hr tablet Take 2 tablets (1,000 mg total) by mouth 2 (two) times daily.   metroNIDAZOLE  (METROGEL ) 0.75 % gel Apply 1 Application topically 2 (two) times daily.   rosuvastatin  (CRESTOR ) 20 MG tablet TAKE 1 TABLET BY MOUTH EVERY DAY   telmisartan  (MICARDIS ) 80 MG tablet Take 1 tablet (80 mg total) by mouth daily.   [DISCONTINUED] amLODipine  (NORVASC ) 5 MG tablet TAKE 1 TABLET BY MOUTH TWICE A DAY   [DISCONTINUED] metFORMIN  (GLUCOPHAGE -XR) 500 MG 24 hr tablet TAKE 2 TABLETS BY MOUTH TWICE A DAY   [DISCONTINUED] telmisartan  (MICARDIS ) 80 MG tablet TAKE 1 TABLET BY MOUTH EVERY DAY   No facility-administered encounter medications on file as of 01/23/2024.     Lab Results  Component Value Date   WBC 5.3 09/14/2023   HGB 11.8 (L) 09/14/2023   HCT 36.2 09/14/2023   PLT 209.0 09/14/2023   GLUCOSE 103 (H) 01/18/2024   CHOL 109 01/18/2024   TRIG 82.0 01/18/2024   HDL 42.80 01/18/2024   LDLDIRECT 157.1 01/21/2013   LDLCALC 50 01/18/2024   ALT 18 01/18/2024   AST 16 01/18/2024   NA 145 01/18/2024   K 4.0 01/18/2024   CL 106 01/18/2024   CREATININE 0.89 01/18/2024   BUN 11 01/18/2024   CO2 31 01/18/2024   TSH 4.03 03/09/2023   HGBA1C 7.4 (H) 01/18/2024   MICROALBUR <0.7 01/23/2024    MM 3D SCREENING MAMMOGRAM BILATERAL BREAST Result Date: 04/13/2023 CLINICAL DATA:  Screening. EXAM: DIGITAL SCREENING BILATERAL MAMMOGRAM WITH TOMOSYNTHESIS AND CAD TECHNIQUE: Bilateral screening digital craniocaudal and  mediolateral oblique mammograms were obtained. Bilateral screening digital breast tomosynthesis was performed. The images were evaluated with computer-aided detection. COMPARISON:  Previous exam(s). ACR Breast Density Category b: There are scattered areas of fibroglandular density. FINDINGS: There are no findings suspicious for malignancy. IMPRESSION: No mammographic evidence of malignancy. A result letter of this screening mammogram will be mailed directly to the patient. RECOMMENDATION: Screening mammogram in one year. (Code:SM-B-01Y) BI-RADS CATEGORY  1: Negative. Electronically Signed   By: Dina  Arceo M.D.   On: 04/13/2023 13:13       Assessment & Plan:  Stress Assessment & Plan: Increased stress as outlined. Does not feel needs any further intervention at this time. Follow.    Hypercholesteremia Assessment & Plan: Continue crestor .  Low cholesterol diet and exercise.  Follow lipid panel and liver function tests. No change in medication today.  Lab Results  Component Value Date   CHOL 109 01/18/2024   HDL 42.80 01/18/2024   LDLCALC 50 01/18/2024   LDLDIRECT 157.1 01/21/2013   TRIG 82.0 01/18/2024   CHOLHDL 3 01/18/2024    Orders: -     Lipid panel; Future -     Hepatic function panel; Future -     Basic metabolic panel with GFR; Future -     CBC with Differential/Platelet; Future  Type 2 diabetes mellitus with hyperglycemia, without long-term current use of insulin (HCC) Assessment & Plan: On metformin  XR.  Bowels better.  Continues jardiance .  Low carb diet and exercise.  Follow met b and A1c. Recent check 7.4  Increased from lst. Have discussed other treatment options, specifically GLP 1 agonist. Desires not to do an injection. Have discussed rybelsus. She wants to hold on starting a new medication at this  time. Wants to work in diet and exercise. Follow met b and A1c.    Orders: -     Hemoglobin A1c; Future -     Microalbumin / creatinine urine ratio -     CBC with  Differential/Platelet; Future  Primary hypertension Assessment & Plan: Continue micardis  and amlodipine . Also on jardiance . Follow pressures. Follow metabolic panel. No change in medication today.   Orders: -     TSH; Future  Anemia, unspecified type Assessment & Plan: Recent hgb stable 11.8. follow cbc.    Other orders -     amLODIPine  Besylate; Take 1 tablet (5 mg total) by mouth 2 (two) times daily.  Dispense: 180 tablet; Refill: 1 -     metFORMIN  HCl ER; Take 2 tablets (1,000 mg total) by mouth 2 (two) times daily.  Dispense: 360 tablet; Refill: 1 -     Telmisartan ; Take 1 tablet (80 mg total) by mouth daily.  Dispense: 90 tablet; Refill: 1 -     Lancets; Use to check blood sugars once daily. Dx E11.9  Dispense: 100 each; Refill: 11     Allena Hamilton, MD

## 2024-01-24 LAB — MICROALBUMIN / CREATININE URINE RATIO
Creatinine,U: 48.3 mg/dL
Microalb Creat Ratio: UNDETERMINED mg/g (ref 0.0–30.0)
Microalb, Ur: <0.7 mg/dL

## 2024-01-25 ENCOUNTER — Ambulatory Visit: Payer: Self-pay | Admitting: Internal Medicine

## 2024-01-27 ENCOUNTER — Encounter: Payer: Self-pay | Admitting: Internal Medicine

## 2024-01-27 NOTE — Assessment & Plan Note (Signed)
 Recent hgb stable 11.8. follow cbc.

## 2024-01-27 NOTE — Assessment & Plan Note (Signed)
 Continue crestor .  Low cholesterol diet and exercise.  Follow lipid panel and liver function tests. No change in medication today.  Lab Results  Component Value Date   CHOL 109 01/18/2024   HDL 42.80 01/18/2024   LDLCALC 50 01/18/2024   LDLDIRECT 157.1 01/21/2013   TRIG 82.0 01/18/2024   CHOLHDL 3 01/18/2024

## 2024-01-27 NOTE — Assessment & Plan Note (Signed)
 On metformin  XR.  Bowels better.  Continues jardiance .  Low carb diet and exercise.  Follow met b and A1c. Recent check 7.4  Increased from lst. Have discussed other treatment options, specifically GLP 1 agonist. Desires not to do an injection. Have discussed rybelsus. She wants to hold on starting a new medication at this time. Wants to work in diet and exercise. Follow met b and A1c.

## 2024-01-27 NOTE — Assessment & Plan Note (Signed)
 Continue micardis  and amlodipine . Also on jardiance . Follow pressures. Follow metabolic panel. No change in medication today.

## 2024-01-27 NOTE — Assessment & Plan Note (Signed)
 Increased stress as outlined. Does not feel needs any further intervention at this time. Follow.

## 2024-02-04 ENCOUNTER — Other Ambulatory Visit: Payer: Self-pay | Admitting: Internal Medicine

## 2024-03-02 ENCOUNTER — Other Ambulatory Visit: Payer: Self-pay | Admitting: Internal Medicine

## 2024-03-13 ENCOUNTER — Other Ambulatory Visit: Payer: Self-pay | Admitting: Internal Medicine

## 2024-03-13 DIAGNOSIS — Z1231 Encounter for screening mammogram for malignant neoplasm of breast: Secondary | ICD-10-CM

## 2024-04-22 ENCOUNTER — Ambulatory Visit
Admission: RE | Admit: 2024-04-22 | Discharge: 2024-04-22 | Disposition: A | Discharge status: Home / Self Care | Source: Ambulatory Visit | Attending: Internal Medicine | Admitting: Internal Medicine

## 2024-04-22 DIAGNOSIS — Z1231 Encounter for screening mammogram for malignant neoplasm of breast: Secondary | ICD-10-CM | POA: Diagnosis present

## 2024-04-27 ENCOUNTER — Other Ambulatory Visit: Payer: Self-pay | Admitting: Internal Medicine

## 2024-05-01 ENCOUNTER — Other Ambulatory Visit

## 2024-05-01 NOTE — Telephone Encounter (Signed)
 Copied from CRM [id number]. Topic: General - Other >> May 01, 2024  9:47 AM [place] E wrote: Reason for CRM: FYI - Patient called in regarding her Jardiance  refill request. Relayed most recent refill encounter note from Chayanne Filippi and patient stated that she does have enough to get her through to her appointment that is on Monday.

## 2024-05-02 ENCOUNTER — Other Ambulatory Visit

## 2024-05-02 DIAGNOSIS — E1165 Type 2 diabetes mellitus with hyperglycemia: Secondary | ICD-10-CM

## 2024-05-02 DIAGNOSIS — I10 Essential (primary) hypertension: Secondary | ICD-10-CM

## 2024-05-02 DIAGNOSIS — E7800 Pure hypercholesterolemia, unspecified: Secondary | ICD-10-CM

## 2024-05-02 LAB — HEPATIC FUNCTION PANEL
ALT: 16 U/L (ref 3–35)
AST: 18 U/L (ref 5–37)
Albumin: 4.5 g/dL (ref 3.5–5.2)
Alkaline Phosphatase: 52 U/L (ref 39–117)
Bilirubin, Direct: 0.1 mg/dL (ref 0.1–0.3)
Total Bilirubin: 0.4 mg/dL (ref 0.2–1.2)
Total Protein: 6.6 g/dL (ref 6.0–8.3)

## 2024-05-02 LAB — LIPID PANEL
Cholesterol: 112 mg/dL (ref 28–200)
HDL: 48.8 mg/dL
LDL Cholesterol: 51 mg/dL (ref 10–99)
NonHDL: 63.65
Total CHOL/HDL Ratio: 2
Triglycerides: 65 mg/dL (ref 10.0–149.0)
VLDL: 13 mg/dL (ref 0.0–40.0)

## 2024-05-02 LAB — BASIC METABOLIC PANEL WITH GFR
BUN: 12 mg/dL (ref 6–23)
CO2: 29 meq/L (ref 19–32)
Calcium: 9.2 mg/dL (ref 8.4–10.5)
Chloride: 107 meq/L (ref 96–112)
Creatinine, Ser: 0.85 mg/dL (ref 0.40–1.20)
GFR: 67.74 mL/min
Glucose, Bld: 85 mg/dL (ref 70–99)
Potassium: 4 meq/L (ref 3.5–5.1)
Sodium: 143 meq/L (ref 135–145)

## 2024-05-02 LAB — CBC WITH DIFFERENTIAL/PLATELET
Basophils Absolute: 0 K/uL (ref 0.0–0.1)
Basophils Relative: 0.8 % (ref 0.0–3.0)
Eosinophils Absolute: 0 K/uL (ref 0.0–0.7)
Eosinophils Relative: 0 % (ref 0.0–5.0)
HCT: 36.2 % (ref 36.0–46.0)
Hemoglobin: 11.9 g/dL — ABNORMAL LOW (ref 12.0–15.0)
Lymphocytes Relative: 31.4 % (ref 12.0–46.0)
Lymphs Abs: 1.8 K/uL (ref 0.7–4.0)
MCHC: 32.8 g/dL (ref 30.0–36.0)
MCV: 87.2 fl (ref 78.0–100.0)
Monocytes Absolute: 0.5 K/uL (ref 0.1–1.0)
Monocytes Relative: 9.4 % (ref 3.0–12.0)
Neutro Abs: 3.3 K/uL (ref 1.4–7.7)
Neutrophils Relative %: 58.4 % (ref 43.0–77.0)
Platelets: 190 K/uL (ref 150.0–400.0)
RBC: 4.15 Mil/uL (ref 3.87–5.11)
RDW: 14.1 % (ref 11.5–15.5)
WBC: 5.7 K/uL (ref 4.0–10.5)

## 2024-05-02 LAB — TSH: TSH: 4.4 u[IU]/mL (ref 0.35–5.50)

## 2024-05-02 LAB — HEMOGLOBIN A1C: Hgb A1c MFr Bld: 6.7 % — ABNORMAL HIGH (ref 4.6–6.5)

## 2024-05-05 ENCOUNTER — Encounter: Payer: Self-pay | Admitting: Internal Medicine

## 2024-05-05 ENCOUNTER — Ambulatory Visit: Admitting: Internal Medicine

## 2024-05-05 VITALS — BP 138/76 | HR 83 | Temp 98.2°F | Ht 64.0 in | Wt 150.6 lb

## 2024-05-05 DIAGNOSIS — Z0000 Encounter for general adult medical examination without abnormal findings: Secondary | ICD-10-CM

## 2024-05-05 DIAGNOSIS — I10 Essential (primary) hypertension: Secondary | ICD-10-CM

## 2024-05-05 DIAGNOSIS — F439 Reaction to severe stress, unspecified: Secondary | ICD-10-CM | POA: Diagnosis not present

## 2024-05-05 DIAGNOSIS — E1165 Type 2 diabetes mellitus with hyperglycemia: Secondary | ICD-10-CM | POA: Diagnosis not present

## 2024-05-05 DIAGNOSIS — E7800 Pure hypercholesterolemia, unspecified: Secondary | ICD-10-CM

## 2024-05-05 DIAGNOSIS — D649 Anemia, unspecified: Secondary | ICD-10-CM | POA: Diagnosis not present

## 2024-05-05 DIAGNOSIS — Z7984 Long term (current) use of oral hypoglycemic drugs: Secondary | ICD-10-CM

## 2024-05-05 LAB — HM DIABETES FOOT EXAM

## 2024-05-05 MED ORDER — METFORMIN HCL ER 500 MG PO TB24: 1000.0000 mg | ORAL_TABLET | Freq: Two times a day (BID) | ORAL | 1 refills | Status: AC

## 2024-05-05 MED ORDER — ROSUVASTATIN CALCIUM 20 MG PO TABS: 20.0000 mg | ORAL_TABLET | Freq: Every day | ORAL | 3 refills | Status: AC

## 2024-05-05 MED ORDER — EMPAGLIFLOZIN 25 MG PO TABS: 25.0000 mg | ORAL_TABLET | Freq: Every day | ORAL | 3 refills | Status: AC

## 2024-05-05 MED ORDER — TELMISARTAN 80 MG PO TABS: 80.0000 mg | ORAL_TABLET | Freq: Every day | ORAL | 3 refills | Status: AC

## 2024-05-05 MED ORDER — AMLODIPINE BESYLATE 5 MG PO TABS: 5.0000 mg | ORAL_TABLET | Freq: Two times a day (BID) | ORAL | 3 refills | Status: AC

## 2024-05-05 NOTE — Assessment & Plan Note (Signed)
 Physical today 05/05/24.  Mammogram 04/22/24 - birads I.  Declines colonoscopy. Discussed again today.  She declines.

## 2024-05-05 NOTE — Progress Notes (Signed)
 "  Subjective:    Patient ID: Gabrielle Yu, female    DOB: 11/15/1950, 74 y.o.   MRN: [id number]  Patient here for  Chief Complaint  Patient presents with   Medical Management of Chronic Issues   Annual Exam    HPI With past history of hypercholesterolemia, hypertension and diabetes, she comes in today to follow up on these issues as well as for a complete physical exam. Continues on metformin  and jardiance . Reviewed recent labs.  A1c improved - 6.7. has had increased stress - husband's medical issues. He is doing better. Handling stress. Staying active. No chest pain or sob reported.    Past Medical History:  Diagnosis Date   Diabetes mellitus (HCC)    High cholesterol    Hypercholesterolemia    Hypertension    Past Surgical History:  Procedure Laterality Date   BREAST BIOPSY Left 2014   core with clip   CATARACT EXTRACTION W/PHACO Right 01/24/2017   Procedure: CATARACT EXTRACTION PHACO AND INTRAOCULAR LENS PLACEMENT (IOC) RIGHT DIABETIC COMPLICATED;  Surgeon: Mittie Gaskin, MD;  Location: [facility];  Service: Ophthalmology;  Laterality: Right;  Diabetic - oral meds   TONSILLECTOMY AND ADENOIDECTOMY  1958   TUBAL LIGATION  1982   Family History  Problem Relation Age of Onset   Breast cancer Mother 60   Hypertension Mother    Heart disease Father        myocardial infarction - died age 64   Heart disease Other        paternal aunts and uncles   Colon cancer Neg Hx    Social History   Socioeconomic History   Marital status: Married    Spouse name: Not on file   Number of children: 2   Years of education: Not on file   Highest education level: Not on file  Occupational History    Employer: [employer]  Tobacco Use   Smoking status: Never   Smokeless tobacco: Never  Vaping Use   Vaping status: Never Used  Substance and Sexual Activity   Alcohol use: No    Alcohol/week: 0.0 standard drinks of alcohol   Drug use: No    Sexual activity: Not on file  Other Topics Concern   Not on file  Social History Narrative   Not on file   Social Drivers of Health   Tobacco Use: Low Risk (05/05/2024)   Patient History    Smoking Tobacco Use: Never    Smokeless Tobacco Use: Never    Passive Exposure: Not on file  Financial Resource Strain: Low Risk (09/26/2022)   Overall Financial Resource Strain (CARDIA)    Difficulty of Paying Living Expenses: Not hard at all  Food Insecurity: No Food Insecurity (09/26/2022)   Hunger Vital Sign    Worried About Running Out of Food in the Last Year: Never true    Ran Out of Food in the Last Year: Never true  Transportation Needs: No Transportation Needs (09/26/2022)   PRAPARE - Administrator, Civil Service (Medical): No    Lack of Transportation (Non-Medical): No  Physical Activity: Insufficiently Active (09/26/2022)   Exercise Vital Sign    Days of Exercise per Week: 2 days    Minutes of Exercise per Session: 20 min  Stress: No Stress Concern Present (09/26/2022)   [employer] of Occupational Health - Occupational Stress Questionnaire    Feeling of Stress : Not at all  Social Connections: Moderately Integrated (09/26/2022)  Social Connection and Isolation Panel    Frequency of Communication with Friends and Family: Three times a week    Frequency of Social Gatherings with Friends and Family: More than three times a week    Attends Religious Services: Never    Database Administrator or Organizations: Yes    Attends Banker Meetings: Never    Marital Status: Married  Depression (PHQ2-9): Low Risk (01/23/2024)   Depression (PHQ2-9)    PHQ-2 Score: 0  Alcohol Screen: Low Risk (09/26/2022)   Alcohol Screen    Last Alcohol Screening Score (AUDIT): 0  Housing: Low Risk (09/26/2022)   Housing    Last Housing Risk Score: 0  Utilities: Not At Risk (09/26/2022)   AHC Utilities    Threatened with loss of utilities: No  Health Literacy: Not on file     Review  of Systems  Constitutional:  Negative for appetite change and unexpected weight change.  HENT:  Negative for congestion, sinus pressure and sore throat.   Eyes:  Negative for pain and visual disturbance.  Respiratory:  Negative for cough, chest tightness and shortness of breath.   Cardiovascular:  Negative for chest pain, palpitations and leg swelling.  Gastrointestinal:  Negative for abdominal pain, diarrhea, nausea and vomiting.  Genitourinary:  Negative for difficulty urinating and dysuria.  Musculoskeletal:  Negative for joint swelling and myalgias.  Skin:  Negative for color change and rash.  Neurological:  Negative for dizziness and headaches.  Hematological:  Negative for adenopathy. Does not bruise/bleed easily.  Psychiatric/Behavioral:  Negative for agitation and dysphoric mood.        Objective:     BP 138/76   Pulse 83   Temp 98.2 F (36.8 C) (Oral)   Ht 5' 4 (1.626 m)   Wt 150 lb 9.6 oz (68.3 kg)   SpO2 97%   BMI 25.85 kg/m  Wt Readings from Last 3 Encounters:  05/05/24 150 lb 9.6 oz (68.3 kg)  01/23/24 154 lb 6.4 oz (70 kg)  09/19/23 155 lb (70.3 kg)    Physical Exam Vitals reviewed.  Constitutional:      General: She is not in acute distress.    Appearance: Normal appearance. She is well-developed.  HENT:     Head: Normocephalic and atraumatic.     Right Ear: External ear normal.     Left Ear: External ear normal.     Mouth/Throat:     Pharynx: No oropharyngeal exudate or posterior oropharyngeal erythema.  Eyes:     General: No scleral icterus.       Right eye: No discharge.        Left eye: No discharge.     Conjunctiva/sclera: Conjunctivae normal.  Neck:     Thyroid : No thyromegaly.  Cardiovascular:     Rate and Rhythm: Normal rate and regular rhythm.  Pulmonary:     Effort: No tachypnea, accessory muscle usage or respiratory distress.     Breath sounds: Normal breath sounds. No decreased breath sounds or wheezing.  Chest:  Breasts:     Right: No inverted nipple, mass, nipple discharge or tenderness (no axillary adenopathy).     Left: No inverted nipple, mass, nipple discharge or tenderness (no axilarry adenopathy).  Abdominal:     General: Bowel sounds are normal.     Palpations: Abdomen is soft.     Tenderness: There is no abdominal tenderness.  Musculoskeletal:        General: No swelling or tenderness.  Cervical back: Neck supple.  Lymphadenopathy:     Cervical: No cervical adenopathy.  Skin:    Findings: No erythema or rash.  Neurological:     Mental Status: She is alert and oriented to person, place, and time.  Psychiatric:        Mood and Affect: Mood normal.        Behavior: Behavior normal.         Outpatient Encounter Medications as of 05/05/2024  Medication Sig   glucose blood test strip Use as instructed to check blood sugars once daily. Dx E11.9   Lancets MISC Use to check blood sugars once daily. Dx E11.9   metroNIDAZOLE  (METROGEL ) 0.75 % gel Apply 1 Application topically 2 (two) times daily.   amLODipine  (NORVASC ) 5 MG tablet Take 1 tablet (5 mg total) by mouth 2 (two) times daily.   empagliflozin  (JARDIANCE ) 25 MG TABS tablet Take 1 tablet (25 mg total) by mouth daily.   metFORMIN  (GLUCOPHAGE -XR) 500 MG 24 hr tablet Take 2 tablets (1,000 mg total) by mouth 2 (two) times daily.   rosuvastatin  (CRESTOR ) 20 MG tablet Take 1 tablet (20 mg total) by mouth daily.   telmisartan  (MICARDIS ) 80 MG tablet Take 1 tablet (80 mg total) by mouth daily.   [DISCONTINUED] amLODipine  (NORVASC ) 5 MG tablet Take 1 tablet (5 mg total) by mouth 2 (two) times daily.   [DISCONTINUED] busPIRone  (BUSPAR ) 5 MG tablet Take 1 tablet (5 mg total) by mouth daily. (Patient not taking: Reported on 05/05/2024)   [DISCONTINUED] empagliflozin  (JARDIANCE ) 25 MG TABS tablet TAKE 1 TABLET BY MOUTH EVERY DAY   [DISCONTINUED] metFORMIN  (GLUCOPHAGE -XR) 500 MG 24 hr tablet Take 2 tablets (1,000 mg total) by mouth 2 (two) times daily.    [DISCONTINUED] rosuvastatin  (CRESTOR ) 20 MG tablet TAKE 1 TABLET BY MOUTH EVERY DAY   [DISCONTINUED] telmisartan  (MICARDIS ) 80 MG tablet Take 1 tablet (80 mg total) by mouth daily.   No facility-administered encounter medications on file as of 05/05/2024.     Lab Results  Component Value Date   WBC 5.7 05/02/2024   HGB 11.9 (L) 05/02/2024   HCT 36.2 05/02/2024   PLT 190.0 05/02/2024   GLUCOSE 85 05/02/2024   CHOL 112 05/02/2024   TRIG 65.0 05/02/2024   HDL 48.80 05/02/2024   LDLDIRECT 157.1 01/21/2013   LDLCALC 51 05/02/2024   ALT 16 05/02/2024   AST 18 05/02/2024   NA 143 05/02/2024   K 4.0 05/02/2024   CL 107 05/02/2024   CREATININE 0.85 05/02/2024   BUN 12 05/02/2024   CO2 29 05/02/2024   TSH 4.40 05/02/2024   HGBA1C 6.7 (H) 05/02/2024   MICROALBUR <0.7 01/23/2024    MM 3D SCREENING MAMMOGRAM BILATERAL BREAST Result Date: 04/24/2024 CLINICAL DATA:  Screening. EXAM: DIGITAL SCREENING BILATERAL MAMMOGRAM WITH TOMOSYNTHESIS AND CAD TECHNIQUE: Bilateral screening digital craniocaudal and mediolateral oblique mammograms were obtained. Bilateral screening digital breast tomosynthesis was performed. The images were evaluated with computer-aided detection. COMPARISON:  Previous exam(s). ACR Breast Density Category b: There are scattered areas of fibroglandular density. FINDINGS: There are no findings suspicious for malignancy. IMPRESSION: No mammographic evidence of malignancy. A result letter of this screening mammogram will be mailed directly to the patient. RECOMMENDATION: Screening mammogram in one year. (Code:[id number]-B-01Y) BI-RADS CATEGORY  1: Negative. Electronically Signed   By: Curtistine Noble   On: 04/24/2024 17:48       Assessment & Plan:  Health care maintenance Assessment & Plan: Physical today 05/05/24.  Mammogram 04/22/24 -  birads I.  Declines colonoscopy. Discussed again today.  She declines.    Hypercholesteremia Assessment & Plan: Continue crestor .  Low cholesterol  diet and exercise.  Follow lipid panel and liver function tests. No change in medication today.  Lab Results  Component Value Date   CHOL 112 05/02/2024   HDL 48.80 05/02/2024   LDLCALC 51 05/02/2024   LDLDIRECT 157.1 01/21/2013   TRIG 65.0 05/02/2024   CHOLHDL 2 05/02/2024    Orders: -     CBC with Differential/Platelet; Future -     Basic metabolic panel with GFR; Future -     Hepatic function panel; Future -     Lipid panel; Future  Type 2 diabetes mellitus with hyperglycemia, without long-term current use of insulin (HCC) Assessment & Plan: On metformin  XR.  Bowels better.  Continues jardiance .  Low carb diet and exercise.  Follow met b and A1c.  Recent A1c improved - 6.7. continue low carb diet and exercise. No change in medication. Follow met b and A1c.    Orders: -     CBC with Differential/Platelet; Future -     Hemoglobin A1c; Future  Primary hypertension Assessment & Plan: Continue micardis  and amlodipine . Also on jardiance . Follow pressures. Follow metabolic panel. No change in medication today.    Stress Assessment & Plan: Overall appears to be handling things relatively well. Follow.    Anemia, unspecified type Assessment & Plan: Hgb stable 11.9. declines colonoscopy.    Other orders -     amLODIPine  Besylate; Take 1 tablet (5 mg total) by mouth 2 (two) times daily.  Dispense: 180 tablet; Refill: 3 -     Empagliflozin ; Take 1 tablet (25 mg total) by mouth daily.  Dispense: 90 tablet; Refill: 3 -     metFORMIN  HCl ER; Take 2 tablets (1,000 mg total) by mouth 2 (two) times daily.  Dispense: 360 tablet; Refill: 1 -     Rosuvastatin  Calcium ; Take 1 tablet (20 mg total) by mouth daily.  Dispense: 90 tablet; Refill: 3 -     Telmisartan ; Take 1 tablet (80 mg total) by mouth daily.  Dispense: 90 tablet; Refill: 3     Allena Hamilton, MD "

## 2024-05-11 NOTE — Assessment & Plan Note (Signed)
Overall appears to be handling things relatively well.  Follow.   

## 2024-05-11 NOTE — Assessment & Plan Note (Signed)
 Hgb stable 11.9. declines colonoscopy.

## 2024-05-11 NOTE — Assessment & Plan Note (Signed)
 Continue micardis  and amlodipine . Also on jardiance . Follow pressures. Follow metabolic panel. No change in medication today.

## 2024-05-11 NOTE — Assessment & Plan Note (Signed)
 On metformin  XR.  Bowels better.  Continues jardiance .  Low carb diet and exercise.  Follow met b and A1c.  Recent A1c improved - 6.7. continue low carb diet and exercise. No change in medication. Follow met b and A1c.

## 2024-05-11 NOTE — Assessment & Plan Note (Signed)
 Continue crestor .  Low cholesterol diet and exercise.  Follow lipid panel and liver function tests. No change in medication today.  Lab Results  Component Value Date   CHOL 112 05/02/2024   HDL 48.80 05/02/2024   LDLCALC 51 05/02/2024   LDLDIRECT 157.1 01/21/2013   TRIG 65.0 05/02/2024   CHOLHDL 2 05/02/2024

## 2024-09-03 ENCOUNTER — Other Ambulatory Visit

## 2024-09-05 ENCOUNTER — Ambulatory Visit: Admitting: Internal Medicine
